# Patient Record
Sex: Female | Born: 1975
Health system: Southern US, Community
[De-identification: ages and names within clinical notes are randomized; demographics above are authoritative.]

## PROBLEM LIST (undated history)

## (undated) DIAGNOSIS — M1990 Unspecified osteoarthritis, unspecified site: Secondary | ICD-10-CM

## (undated) DIAGNOSIS — G935 Compression of brain: Secondary | ICD-10-CM

## (undated) DIAGNOSIS — E119 Type 2 diabetes mellitus without complications: Secondary | ICD-10-CM

## (undated) DIAGNOSIS — R51 Headache: Secondary | ICD-10-CM

## (undated) DIAGNOSIS — M7550 Bursitis of unspecified shoulder: Secondary | ICD-10-CM

## (undated) HISTORY — PX: TUBAL LIGATION: SHX77

## (undated) HISTORY — DX: Bursitis of unspecified shoulder: M75.50

## (undated) HISTORY — DX: Compression of brain: G93.5

## (undated) HISTORY — DX: Unspecified osteoarthritis, unspecified site: M19.90

## (undated) HISTORY — DX: Type 2 diabetes mellitus without complications: E11.9

## (undated) HISTORY — DX: Headache: R51

---

## 2000-09-24 ENCOUNTER — Ambulatory Visit (HOSPITAL_COMMUNITY): Admission: RE | Admit: 2000-09-24 | Discharge: 2000-09-24 | Payer: Self-pay | Admitting: *Deleted

## 2001-02-01 ENCOUNTER — Encounter (INDEPENDENT_AMBULATORY_CARE_PROVIDER_SITE_OTHER): Payer: Self-pay

## 2001-02-01 ENCOUNTER — Inpatient Hospital Stay (HOSPITAL_COMMUNITY): Admission: AD | Admit: 2001-02-01 | Discharge: 2001-02-04 | Payer: Self-pay | Admitting: Obstetrics & Gynecology

## 2001-02-01 ENCOUNTER — Encounter (HOSPITAL_COMMUNITY): Admission: RE | Admit: 2001-02-01 | Discharge: 2001-02-01 | Payer: Self-pay | Admitting: *Deleted

## 2001-02-06 ENCOUNTER — Inpatient Hospital Stay (HOSPITAL_COMMUNITY): Admission: AD | Admit: 2001-02-06 | Discharge: 2001-02-06 | Payer: Self-pay | Admitting: Obstetrics

## 2001-02-18 ENCOUNTER — Inpatient Hospital Stay (HOSPITAL_COMMUNITY): Admission: AD | Admit: 2001-02-18 | Discharge: 2001-02-18 | Payer: Self-pay | Admitting: Obstetrics & Gynecology

## 2005-12-09 ENCOUNTER — Inpatient Hospital Stay (HOSPITAL_COMMUNITY): Admission: AD | Admit: 2005-12-09 | Discharge: 2005-12-09 | Payer: Self-pay | Admitting: *Deleted

## 2005-12-11 ENCOUNTER — Inpatient Hospital Stay (HOSPITAL_COMMUNITY): Admission: AD | Admit: 2005-12-11 | Discharge: 2005-12-11 | Payer: Self-pay | Admitting: *Deleted

## 2005-12-18 ENCOUNTER — Inpatient Hospital Stay (HOSPITAL_COMMUNITY): Admission: RE | Admit: 2005-12-18 | Discharge: 2005-12-18 | Payer: Self-pay | Admitting: *Deleted

## 2007-02-01 ENCOUNTER — Inpatient Hospital Stay (HOSPITAL_COMMUNITY): Admission: AD | Admit: 2007-02-01 | Discharge: 2007-02-01 | Payer: Self-pay | Admitting: Gynecology

## 2007-04-22 ENCOUNTER — Ambulatory Visit (HOSPITAL_COMMUNITY): Admission: RE | Admit: 2007-04-22 | Discharge: 2007-04-22 | Payer: Self-pay | Admitting: Gynecology

## 2007-05-07 ENCOUNTER — Ambulatory Visit (HOSPITAL_COMMUNITY): Admission: RE | Admit: 2007-05-07 | Discharge: 2007-05-07 | Payer: Self-pay | Admitting: Obstetrics and Gynecology

## 2007-05-21 ENCOUNTER — Ambulatory Visit (HOSPITAL_COMMUNITY): Admission: RE | Admit: 2007-05-21 | Discharge: 2007-05-21 | Payer: Self-pay | Admitting: Obstetrics & Gynecology

## 2007-07-11 ENCOUNTER — Ambulatory Visit (HOSPITAL_COMMUNITY): Admission: RE | Admit: 2007-07-11 | Discharge: 2007-07-11 | Payer: Self-pay | Admitting: Family Medicine

## 2007-07-18 ENCOUNTER — Ambulatory Visit (HOSPITAL_COMMUNITY): Admission: RE | Admit: 2007-07-18 | Discharge: 2007-07-18 | Payer: Self-pay | Admitting: Family Medicine

## 2007-07-22 ENCOUNTER — Ambulatory Visit: Payer: Self-pay | Admitting: Obstetrics & Gynecology

## 2007-07-25 ENCOUNTER — Ambulatory Visit (HOSPITAL_COMMUNITY): Admission: RE | Admit: 2007-07-25 | Discharge: 2007-07-25 | Payer: Self-pay | Admitting: Obstetrics & Gynecology

## 2007-07-25 ENCOUNTER — Ambulatory Visit: Payer: Self-pay | Admitting: *Deleted

## 2007-07-29 ENCOUNTER — Ambulatory Visit: Payer: Self-pay | Admitting: Family Medicine

## 2007-08-01 ENCOUNTER — Ambulatory Visit: Payer: Self-pay | Admitting: Obstetrics & Gynecology

## 2007-08-01 ENCOUNTER — Ambulatory Visit (HOSPITAL_COMMUNITY): Admission: RE | Admit: 2007-08-01 | Discharge: 2007-08-01 | Payer: Self-pay | Admitting: Obstetrics & Gynecology

## 2007-08-06 ENCOUNTER — Ambulatory Visit: Payer: Self-pay | Admitting: Obstetrics & Gynecology

## 2007-08-08 ENCOUNTER — Ambulatory Visit (HOSPITAL_COMMUNITY): Admission: RE | Admit: 2007-08-08 | Discharge: 2007-08-08 | Payer: Self-pay | Admitting: Obstetrics & Gynecology

## 2007-08-08 ENCOUNTER — Ambulatory Visit: Payer: Self-pay | Admitting: Obstetrics & Gynecology

## 2007-08-13 ENCOUNTER — Ambulatory Visit: Payer: Self-pay | Admitting: Obstetrics and Gynecology

## 2007-08-15 ENCOUNTER — Ambulatory Visit (HOSPITAL_COMMUNITY): Admission: RE | Admit: 2007-08-15 | Discharge: 2007-08-15 | Payer: Self-pay | Admitting: Obstetrics & Gynecology

## 2007-08-15 ENCOUNTER — Ambulatory Visit: Payer: Self-pay | Admitting: Obstetrics & Gynecology

## 2007-08-18 ENCOUNTER — Ambulatory Visit: Payer: Self-pay | Admitting: Obstetrics & Gynecology

## 2007-08-18 ENCOUNTER — Inpatient Hospital Stay (HOSPITAL_COMMUNITY): Admission: AD | Admit: 2007-08-18 | Discharge: 2007-08-29 | Payer: Self-pay | Admitting: Obstetrics & Gynecology

## 2007-08-18 ENCOUNTER — Ambulatory Visit: Payer: Self-pay | Admitting: *Deleted

## 2007-08-22 ENCOUNTER — Encounter: Payer: Self-pay | Admitting: *Deleted

## 2007-08-26 ENCOUNTER — Encounter: Payer: Self-pay | Admitting: Obstetrics and Gynecology

## 2007-09-03 ENCOUNTER — Inpatient Hospital Stay (HOSPITAL_COMMUNITY): Admission: AD | Admit: 2007-09-03 | Discharge: 2007-09-03 | Payer: Self-pay | Admitting: Obstetrics & Gynecology

## 2007-09-03 ENCOUNTER — Ambulatory Visit: Payer: Self-pay | Admitting: Obstetrics and Gynecology

## 2007-09-06 ENCOUNTER — Inpatient Hospital Stay (HOSPITAL_COMMUNITY): Admission: AD | Admit: 2007-09-06 | Discharge: 2007-09-06 | Payer: Self-pay | Admitting: Obstetrics & Gynecology

## 2007-09-07 ENCOUNTER — Ambulatory Visit: Payer: Self-pay | Admitting: *Deleted

## 2007-09-07 ENCOUNTER — Inpatient Hospital Stay (HOSPITAL_COMMUNITY): Admission: AD | Admit: 2007-09-07 | Discharge: 2007-09-07 | Payer: Self-pay | Admitting: Obstetrics & Gynecology

## 2007-09-08 ENCOUNTER — Inpatient Hospital Stay (HOSPITAL_COMMUNITY): Admission: AD | Admit: 2007-09-08 | Discharge: 2007-09-08 | Payer: Self-pay | Admitting: Obstetrics & Gynecology

## 2007-10-04 ENCOUNTER — Ambulatory Visit: Payer: Self-pay | Admitting: Advanced Practice Midwife

## 2007-10-04 ENCOUNTER — Inpatient Hospital Stay (HOSPITAL_COMMUNITY): Admission: AD | Admit: 2007-10-04 | Discharge: 2007-10-04 | Payer: Self-pay | Admitting: Obstetrics and Gynecology

## 2008-03-25 IMAGING — US US FETAL BPP W/O NONSTRESS
1 series · 8 of 8 positions shown · non-contrast
Comparison: none

OBSTETRICAL ULTRASOUND:
 This ultrasound was performed in The [HOSPITAL], and the [facility] report will be stored to [REDACTED] PACS.

[Series 1: us fetal bpp w/o nonstress · 8 of 8 slices shown]
[im 1/8]
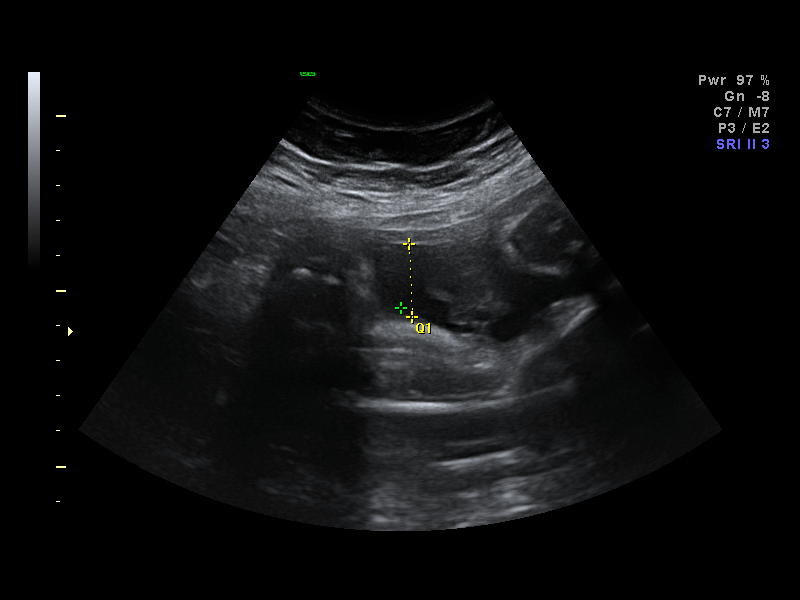
[im 2/8]
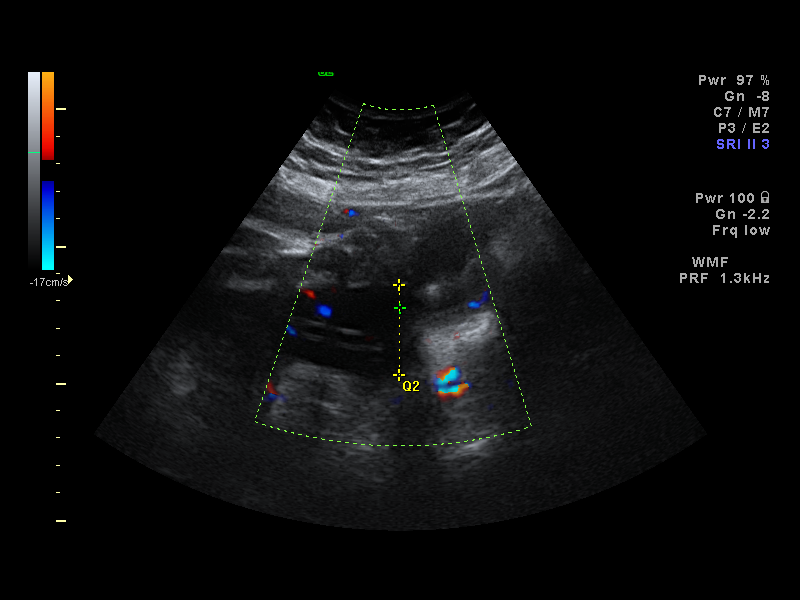
[im 3/8]
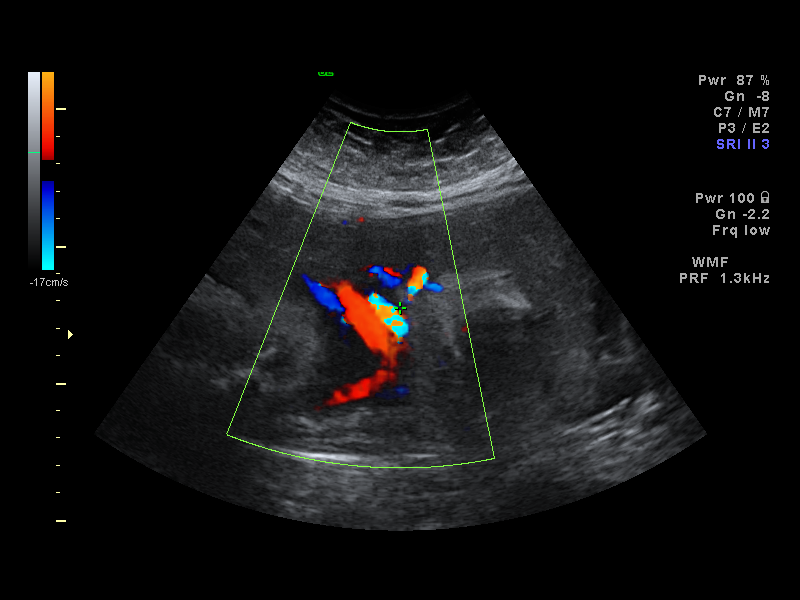
[im 4/8]
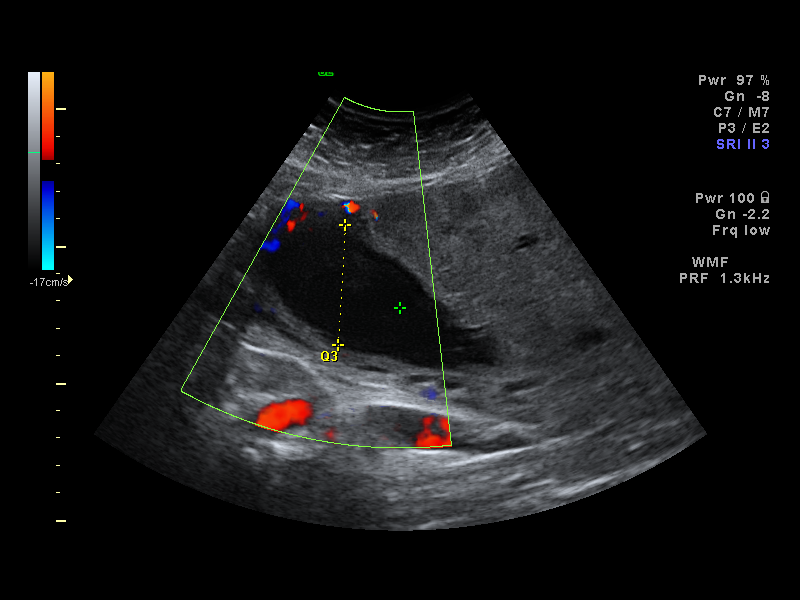
[im 5/8]
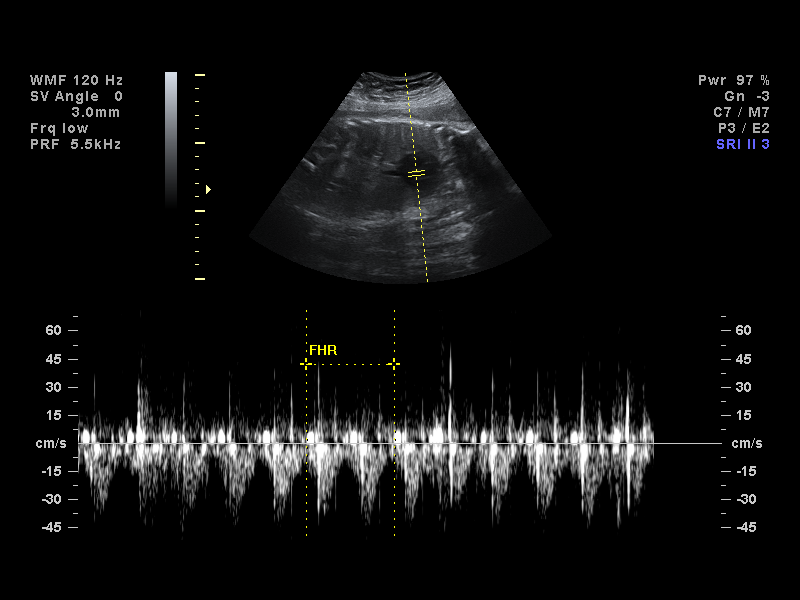
[im 6/8]
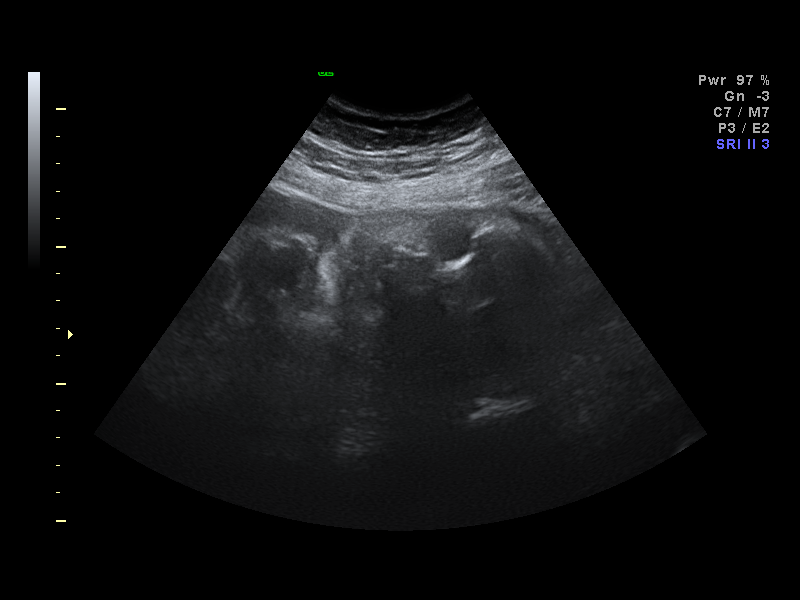
[im 7/8]
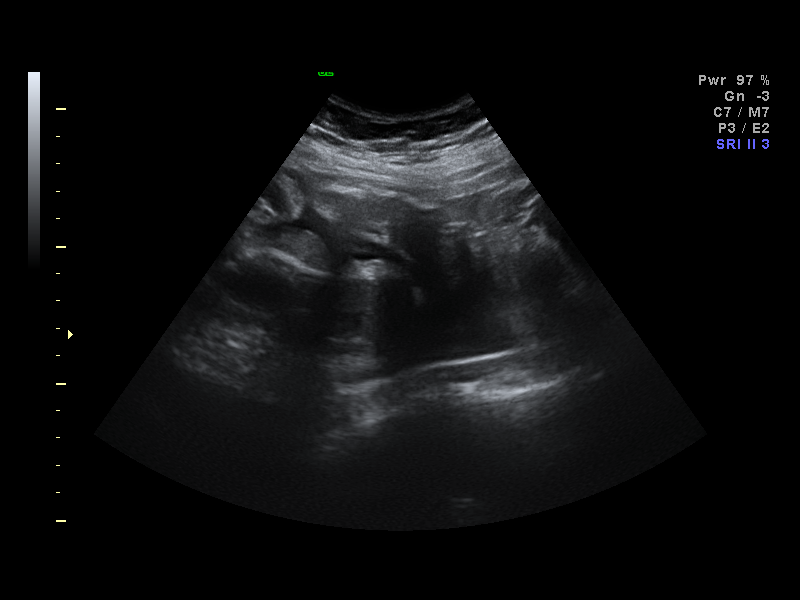
[im 8/8]
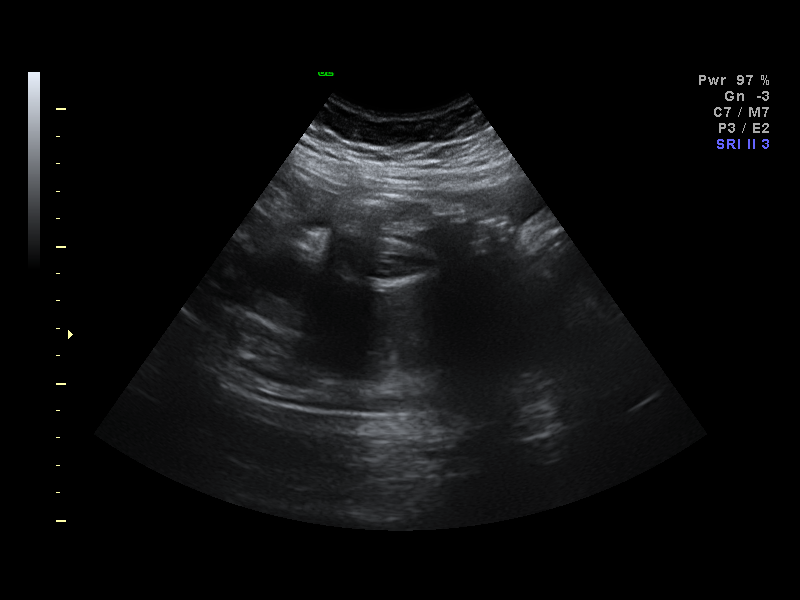

[8 of 8 positions shown; findings below may reference images not displayed]

IMPRESSION: The [facility] report has also been faxed to the ordering physician.

## 2008-07-23 ENCOUNTER — Ambulatory Visit: Payer: Self-pay | Admitting: *Deleted

## 2008-07-23 ENCOUNTER — Ambulatory Visit: Payer: Self-pay | Admitting: Family Medicine

## 2008-08-12 ENCOUNTER — Ambulatory Visit (HOSPITAL_COMMUNITY): Admission: RE | Admit: 2008-08-12 | Discharge: 2008-08-12 | Payer: Self-pay | Admitting: Family Medicine

## 2008-11-24 ENCOUNTER — Ambulatory Visit: Payer: Self-pay | Admitting: Family Medicine

## 2008-12-18 ENCOUNTER — Ambulatory Visit: Payer: Self-pay | Admitting: Family Medicine

## 2009-01-07 ENCOUNTER — Ambulatory Visit: Payer: Self-pay | Admitting: Family Medicine

## 2009-01-07 ENCOUNTER — Encounter (INDEPENDENT_AMBULATORY_CARE_PROVIDER_SITE_OTHER): Payer: Self-pay | Admitting: Internal Medicine

## 2009-01-07 LAB — CONVERTED CEMR LAB
AST: 12 units/L (ref 0–37)
Albumin: 4.5 g/dL (ref 3.5–5.2)
BUN: 13 mg/dL (ref 6–23)
Calcium: 8.7 mg/dL (ref 8.4–10.5)
Chloride: 106 meq/L (ref 96–112)
HDL: 57 mg/dL (ref >39)
Potassium: 4 meq/L (ref 3.5–5.3)
TSH: 0.62 microintl units/mL (ref 0.350–4.500)

## 2009-03-09 ENCOUNTER — Ambulatory Visit: Payer: Self-pay | Admitting: Internal Medicine

## 2009-04-15 ENCOUNTER — Ambulatory Visit: Payer: Self-pay | Admitting: Internal Medicine

## 2009-07-15 ENCOUNTER — Ambulatory Visit: Payer: Self-pay | Admitting: Family Medicine

## 2009-10-12 ENCOUNTER — Ambulatory Visit: Payer: Self-pay | Admitting: Internal Medicine

## 2009-11-09 ENCOUNTER — Ambulatory Visit: Payer: Self-pay | Admitting: Internal Medicine

## 2009-11-09 ENCOUNTER — Encounter (INDEPENDENT_AMBULATORY_CARE_PROVIDER_SITE_OTHER): Payer: Self-pay | Admitting: Family Medicine

## 2009-11-09 LAB — CONVERTED CEMR LAB
Chlamydia, DNA Probe: NEGATIVE
GC Probe Amp, Genital: NEGATIVE

## 2009-11-25 ENCOUNTER — Ambulatory Visit: Payer: Self-pay | Admitting: Family Medicine

## 2009-12-06 ENCOUNTER — Ambulatory Visit: Payer: Self-pay | Admitting: Internal Medicine

## 2010-10-09 DIAGNOSIS — M1990 Unspecified osteoarthritis, unspecified site: Secondary | ICD-10-CM

## 2010-10-09 HISTORY — DX: Unspecified osteoarthritis, unspecified site: M19.90

## 2010-10-30 ENCOUNTER — Encounter: Payer: Self-pay | Admitting: *Deleted

## 2011-02-21 NOTE — Op Note (Signed)
NAME:  Marissa Jimenez, Marissa Jimenez         ACCOUNT NO.:  [id number]   MEDICAL RECORD NO.:  [id number]          PATIENT TYPE:  INP   LOCATION:                                FACILITY:  [facility]   PHYSICIAN:  Phil D. Okey Dupre, M.D.     DATE OF BIRTH:  Feb 20, 1976   DATE OF PROCEDURE:  08/26/2007  DATE OF DISCHARGE:                               OPERATIVE REPORT   PROCEDURE:  Classical cesarean section and bilateral tubal occlusion.   PREOPERATIVE DIAGNOSIS:  Repeat cesarean section, complete placenta  previa with anterior placenta and voluntary sterilization.   SURGEON:  Allie Bossier, MD; Javier Glazier. Okey Dupre, M.D.   ASSISTANT:  Dr. Melvenia Beam.   ESTIMATED BLOOD LOSS:  1000 mL.   POSTOPERATIVE CONDITION:  Satisfactory.   ANESTHESIA:  Spinal.   SPECIMENS TO PATHOLOGY:  Placenta.   OPERATIVE FINDINGS:  On entering the peritoneal cavity, there were large  burgundy lakes beneath the thin perineum at the lower uterine segment  just above the bladder.  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  The procedure went as follows.   Under satisfactory spinal anesthesia the patient in dorsal supine  position after Foley catheter was placed in urinary bladder, the abdomen  was prepped and draped in the usual sterile manner and entered through a  vertical midline incision extending from just below the umbilicus to  just above the symphysis pubis.  The abdomen was entered by layers.  On  entering the peritoneal cavity there were marked adhesions of omentum to  anterior abdominal wall which were controlled by sharp and blunt  dissection.  Areas of small bleeders were controlled with hot cautery.  Several large omental pieces were clamped, divided and the portion  remaining tied with 1-0 chromic catgut suture.  This was done in three  to four areas and a large piece of omentum was removed freeing up the  visualization to the lower uterine segment.  An Alexis retractor was  placed into the pelvis  and the visualization of the lower segment was as  described above.  It was decided at this time to do a vertical incision  so a classical incision was made approximately 3 cm from the fundus of  the uterus down to just above the lower uterine segment where the  vascular lakes appeared.  In doing this we went right through the  placenta in the lower portion of this incision.  The membranes were  ruptured with a hemostat and the baby quite easily delivered.  the  placenta then surprisingly was easily removed and the area was observed.  This seemed to be no sign of accreta and the incision was closed with  continuous running locked 0 Vicryl on an atraumatic needle.  Two layers  were run in this fashion.  The uterus was still quite boggy. Massage was  carried out vigorously.  Methergine was given IM as well as a direct  injection of Hemabate into the uterine wall.  Firming occurred almost  immediately.  Filshie clamps were placed on each of the tubes  approximately 2 cm from  the distal end for sterilization.  Areas  observed for bleeding.  None was noted and the #1 PDS suture was used to  close the incision in a Smead-Jones type of closure starting at the top  running to half way and then starting at the bottom of the incision and  running up to meet the superior closure.  Areas of the  subcutaneous bleeders were controlled with hot cautery and 2-0 plain  catgut suture was used for subcutaneous approximation.  Skin staples for  skin edge approximation.  Dry sterile dressing was applied.  Total blood  loss approximately 1000 mL.  Tape, instrument, sponge and needle count  reported correct at the end of the procedure.      Phil D. Okey Dupre, M.D.  Electronically Signed     PDR/MEDQ  D:  08/26/2007  T:  08/26/2007  Job:  [id number]

## 2011-02-21 NOTE — Discharge Summary (Signed)
NAME:  Marissa Jimenez, Marissa Jimenez         ACCOUNT NO.:  [id number]   MEDICAL RECORD NO.:  [id number]          PATIENT TYPE:  INP   LOCATION:  9113                          FACILITY:  [facility]   PHYSICIAN:  Phil D. Okey Dupre, M.D.     DATE OF BIRTH:  08-Feb-1976   DATE OF ADMISSION:  08/18/2007  DATE OF DISCHARGE:  08/29/2007                               DISCHARGE SUMMARY   DIAGNOSES ON ADMISSION:  1. Bleeding in pregnancy with complete placenta previa.  2. Bicornuate uterus.  3. History of previous C-section.  4. History of dilation and curettage for retained products.   DIAGNOSES ON DISCHARGE:  1. Bleeding in pregnancy with complete placenta previa.  2. Bicornuate uterus.  3. History of previous C-section.  4. History of dilation and curettage for retained products.  5. Classical cesarean section.  6. Bilateral tubal ligation.   BRIEF HOSPITAL COURSE:  [name] is a 35 year old female  who presented to the [facility] [place] on August 18, 2007  reporting vaginal bleeding.  She was noted to be [redacted] weeks gestation, G4,  P2-0-1-2 with history of previous C-section and dilation and curettage  for retained products.  She was followed in high risk clinic for history  of a complete previa and bicornuate uterus.  Given her history and her  presentation, she was admitted to the antenatal service for close  observation.  Her bleeding stabilized and she remained in stable  condition but it was thought, given her history and presentation, the  safest course of action would be delivery at appropriate gestational  age.  Amniocentesis was considered; however, there was no appropriate  pocket amniotic fluid that would be safe for this procedure.  Consent  was obtained for the procedure, as well as a bilateral tubal ligation  and on November 17, at [redacted] weeks gestation, the patient was taken to the  OR where a classical cesarean section with vertical skin incision, as  well as bilateral  tubal ligation was performed by Dr. Okey Dupre, assisted by  Dr. Marice Potter and Dr. Melvenia Beam.  There is a viable female infant weighing 5 lb,  12 oz with Apgars of 7 and 8 at 1 and 5 minutes respectively.  Also  noted were bicornuate uterus, normal tubes and ovaries.  PTL performed  without complications.  The patient was transferred in good condition to  the postpartum floor, where she remained stable with good vital signs  and on the 3rd postoperative day was discharged home.  An order was  given for [facility] nurse to remove the patient's staples on an  outpatient basis on Monday or Tuesday, November 24-25 given her vertical  skin incision.  At the time of discharge, the patient had no questions or complaints of  note.  She is breast feeding well.  Had a bowel movement and urinated  before discharge.  Vital signs were stable.  She is Rh positive and  rubella immune.  She is to follow up in 6 weeks at the [facility].     ______________________________  Franki Cabot D. Okey Dupre, M.D.  Electronically Signed    JP/MEDQ  D:  08/29/2007  T:  08/29/2007  Job:  [id number]

## 2011-02-24 NOTE — Discharge Summary (Signed)
[facility]  Patient:    Marissa Jimenez, Marissa Jimenez                  MRN: [id number] Adm. Date:  [id number] Disc. Date: [id number] Attending:  Antionette Char Dictator:   Jamey Reas, M.D.                           Discharge Summary  DATE OF BIRTH:                [birth year]  ADMITTING DIAGNOSES:          Term intrauterine pregnancy at 89 and 4, breech presentation, BPP 6/10.  DISCHARGE DIAGNOSES:          Term intrauterine pregnancy at 58 and 4, breech presentation, BPP 6/10, status post low transverse cesarean section for breech presentation and oligohydramnios, routine postoperative care.  CONSULTS:                     None.  PROCEDURE:                    The patient had an low transverse cesarean section performed on February 01, 2001 by Dr. Coral Ceo and Dr. Antionette Char.  At the time of procedure patient was noted to have a bicornuate uterus with one large lobe containing the fetus, smaller more muscular lobe.  Patient had a thin fundus in the horn that contained the fetus.  Patient had routine postoperative course.  No fever or chills. Recovered well with adequate pain control.  She was tolerating a regular diet and ambulating without complication.  CONDITION ON DISCHARGE:       Good.  DISPOSITION:                  Discharged to home.  Spoke with patient at length regarding her bicornuate uterus and abnormalities involving the urogenital tract.  She could have some ______ dysgenesis.  It was recommended to her that patient have an IVP to rule out any urinary tract malformation. This should be scheduled by her followup care takers at her six week postpartum check.  She was advised to remind them of this.  Patient was also discussed at length complications that could result from a further vaginal birth.  It was recommended to her that she have a repeat cesarean section; however, it was also recommended to her that she could have  a vaginal birth without augmentation of labor including prostaglandins specifically.  This was discussed with patient through an interpreter.  Patient agreed that she understood this information and had no further questions regarding this.  DISCHARGE MEDICATIONS:        1. Percocet 5/325 mg one to two p.o. q.4-6h.                                  p.r.n. pain.                               2. Motrin 600 mg one p.o. q.6-8h. p.r.n. pain.                               3. Prenatal vitamins one p.o. q.d.  FOLLOW-UP:  [facility].  DISCHARGE INSTRUCTIONS:       Routine postoperative instructions for work as well as activity. DD:  02/05/01 TD:  02/05/01 Job: [id number]

## 2011-02-24 NOTE — Op Note (Signed)
[facility]  Patient:    Marissa Jimenez, Marissa Jimenez                  MRN: [id number] Proc. Date: 02/01/01 Adm. Date:  [id number] Disc. Date: [id number] Attending:  Antionette Char                           Operative Report  PREOPERATIVE DIAGNOSIS:       1. Intrauterine pregnancy at term.                               2. Oligohydramnios.                               3. Breach presentation.  POSTOPERATIVE DIAGNOSIS:      1. Intrauterine pregnancy at term.                               2. Oligohydramnios.                               3. Breach presentation.                               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  OPERATION:                    Primary low transverse cesarean section via Pfannenstiel.  SURGEONS:                     Roseanna Rainbow, M.D.                               Charles A. Clearance Coots, M.D.  ANESTHESIA:                   Epidural.  COMPLICATIONS:                Mild uterine ______  that responded to intramyometrial uterine Pitocin.  ESTIMATED BLOOD LOSS:         800 cc.  FLUIDS:                       1500 cc lactated ringers.  Urine output 300 cc clear urine.  INDICATIONS:                  The patient is a 35 year old para 1 at 40+ weeks with oligohydramnios and breach presentation.  FINDINGS:                     Infant, frank breach.  Neonatology present at delivery.  The uterus, again, appeared bicornuate with the infant having been in the left horn.  The right horn was felt not to be communicating with the uterine cavity.  The tubes and ovaries were otherwise within normal limits.  DESCRIPTION OF PROCEDURE:     The patient  was taken to the operating room where spinal anesthetic was administered and found to be adequate.  She was then prepped and draped in the usual sterile fashion in the dorsosupine position with a leftward tilt.   A  Pfannenstiel skin incision was then made with the scalpel and carried through to the underlying layer of fascia.  The fascia was nicked in the midline and the incision extended laterally with Mayo scissors.  The superior aspect of the fascial incision was then grasped with Kocher clamps, elevated, and the underlying rectus muscles dissected off. Attention was then turned to the inferior aspect of this incision which was manipulated in a similar fashion.  The rectus muscles were then separated in the midline and the peritoneum tented up and entered sharply with the Metzenbaum scissors.   The peritoneal incision was then extended superiorly and inferiorly with good visualization of the bladder.   The bladder blade was then inserted and the vesicle uterine peritoneum identified, grasped with pickups, and entered sharply.  This incision was then extended laterally and the bladder flap created digitally.  The bladder blade was then reinserted and the lower uterine segment incised in transverse fashion with the scalpel.  The uterine incision was then extended laterally with the bandage scissors.  The bladder blade was removed, and a complete breach extraction was then performed with the infants head ultimately being delivered atraumatically.  The nose and mouth were suctioned with the bulb suction and the cord clamped and cut. The infant was handed off to the awaiting neonatologist.  The placenta was removed.  The uterus was exteriorized and cleared of all clots and debris.  At this point, the uterine tone in the fundus was felt to be boggy, and 10 units of Pitocin were injection into the myometrium into the fundus.  This, in combination with IV Pitocin and massage, produced an improvement in the uterine tone.  The uterine incision was then repaired with O Monocryl in a running interlocking fashion.  Excellent hemostasis was noted.  The uterus was returned to the abdomen.  The gutters were cleared  of all clots.  The fascia was reapproximated with 0 Vicryl.  The skin was closed with staples.  The patient tolerated the procedure well.  Sponge, lap, and needle counts were correct x 2.  The patient was taken to the PACU in stable condition. DD:  02/04/01 TD:  02/04/01 Job: [id number]

## 2011-07-18 LAB — CBC
Hemoglobin: 10 — ABNORMAL LOW
Hemoglobin: 12.1
MCHC: 34.8
MCV: 94.4
Platelets: 196
RBC: 3.05 — ABNORMAL LOW
RBC: 4.02
RDW: 13.9
WBC: 10.1

## 2011-07-18 LAB — CROSSMATCH
ABO/RH(D): O POS
Antibody Screen: NEGATIVE

## 2011-07-18 LAB — TYPE AND SCREEN: Antibody Screen: NEGATIVE

## 2011-07-18 LAB — POCT URINALYSIS DIP (DEVICE)
Glucose, UA: NEGATIVE
Ketones, ur: NEGATIVE
Operator id: 120861
Specific Gravity, Urine: 1.015
Urobilinogen, UA: 0.2

## 2011-07-18 LAB — RPR: RPR Ser Ql: NONREACTIVE

## 2011-07-19 LAB — POCT URINALYSIS DIP (DEVICE)
Hgb urine dipstick: NEGATIVE
Ketones, ur: NEGATIVE
Ketones, ur: NEGATIVE
Protein, ur: NEGATIVE
Protein, ur: NEGATIVE
Protein, ur: NEGATIVE
Specific Gravity, Urine: 1.02
Specific Gravity, Urine: 1.02
Specific Gravity, Urine: 1.025
pH: 7
pH: 7
pH: 7

## 2011-09-05 ENCOUNTER — Other Ambulatory Visit: Payer: Self-pay | Admitting: Family Medicine

## 2011-10-10 DIAGNOSIS — R519 Headache, unspecified: Secondary | ICD-10-CM

## 2011-10-10 HISTORY — DX: Headache, unspecified: R51.9

## 2012-09-19 ENCOUNTER — Ambulatory Visit (INDEPENDENT_AMBULATORY_CARE_PROVIDER_SITE_OTHER): Payer: Self-pay | Admitting: Family Medicine

## 2012-09-19 ENCOUNTER — Encounter: Payer: Self-pay | Admitting: Family Medicine

## 2012-09-19 VITALS — BP 119/64 | HR 66 | Temp 98.8°F | Ht 63.0 in | Wt 216.6 lb

## 2012-09-19 DIAGNOSIS — R10A0 Flank pain, unspecified side: Secondary | ICD-10-CM

## 2012-09-19 DIAGNOSIS — R109 Unspecified abdominal pain: Secondary | ICD-10-CM

## 2012-09-19 NOTE — Progress Notes (Signed)
  Subjective:    Patient ID: Marissa Jimenez, female    DOB: 1976/03/20, 36 y.o.   MRN: [id number]  HPI  Patient here to establish care.  Spanish interpreter present.  She is awaiting an orange card. Patient denies any chronic medical problems and does not take any medications. Acutely, she complains of pain located below RT breast and radiates RT flank. Started 3-4 days ago.  Complains of some discomfort with urination, but no pain/burning with urination.  Denies any associated fever, chills, nausea/vomiting or decreased appetite. Denies any gross blood in urine.  No hx of kidney stones. Took 2 Aspirin for pain 2 days ago - pain improved a little.  I have reviewed patient's PMH, SH, Surgeries, Medications, Allergies, Problem List, and FH.  Review of Systems  Per HPI    Objective:   Physical Exam  Constitutional: She appears well-nourished. No distress.       Obese  Cardiovascular: Normal rate and regular rhythm.   Abdominal: Soft. Bowel sounds are normal. She exhibits no distension. There is no tenderness. There is no rebound and no guarding.      Assessment & Plan:

## 2012-09-20 DIAGNOSIS — R109 Unspecified abdominal pain: Secondary | ICD-10-CM | POA: Insufficient documentation

## 2012-09-20 NOTE — Assessment & Plan Note (Signed)
Flank pain may be secondary to MSK strain/sprain vs. UTI vs. Kidney stone.  However, she denies any GU symptoms and no gross hematuria.  Because patient does not have insurance, I discussed conservative treatment with over the counter Motrin or Tylenol as needed for pain.  Encouraged patient to drink plenty of water and avoid soda/juices for now.  If she develops dysuria, she may try over the counter AZO.  If symptoms worsen or if she develops fever, vomiting, or worsening flank pain, patient to return clinic for work up, UA, cultures, etc.  Patient will call Britta Mccreedy today to set up orange card.  Follow up with me as soon as she receives card.  Red flags reviewed with patient via interpreter.

## 2012-09-20 NOTE — Patient Instructions (Signed)
Instructions discussed with patient via Spanish interpreter.

## 2012-11-27 ENCOUNTER — Ambulatory Visit (INDEPENDENT_AMBULATORY_CARE_PROVIDER_SITE_OTHER): Payer: No Typology Code available for payment source | Admitting: Family Medicine

## 2012-11-27 ENCOUNTER — Encounter: Payer: Self-pay | Admitting: Family Medicine

## 2012-11-27 VITALS — BP 127/90 | HR 71 | Temp 98.9°F | Ht 63.0 in | Wt 213.0 lb

## 2012-11-27 DIAGNOSIS — R109 Unspecified abdominal pain: Secondary | ICD-10-CM

## 2012-11-27 LAB — POCT URINALYSIS DIPSTICK
Bilirubin, UA: NEGATIVE
Ketones, UA: NEGATIVE
Leukocytes, UA: NEGATIVE
pH, UA: 7

## 2012-11-27 LAB — POCT UA - MICROSCOPIC ONLY

## 2012-11-27 MED ORDER — IBUPROFEN 600 MG PO TABS: 600.0000 mg | ORAL_TABLET | Freq: Three times a day (TID) | ORAL | Status: DC | PRN

## 2012-11-27 MED ORDER — CYCLOBENZAPRINE HCL 5 MG PO TABS: 5.0000 mg | ORAL_TABLET | Freq: Three times a day (TID) | ORAL | Status: DC | PRN

## 2012-11-27 NOTE — Assessment & Plan Note (Signed)
UA was positive for blood, likely due to period.  No evidence of infection on UA.  Patient is afebrile with no other systemic symptoms.  Unable to exclude nephrolithiasis due to menstrual bleeding. - Will treat pain with Flexeril and Ibuprofen 600 PRN to treat possible muscle strain/sprain.   - Follow up in 2 weeks, if no improvement in symptoms, will consider [place] or CT abdomen. - Red flags reviewed.

## 2012-11-27 NOTE — Patient Instructions (Addendum)
Return to clinic in 2 weeks if pain does not improve despite pain medications. If you develop worsening pain, fever, nausea/vomiting, please return to clinic.

## 2012-11-27 NOTE — Progress Notes (Signed)
  Subjective:    Patient ID: Marissa Jimenez, female    DOB: 04-Aug-1976, 37 y.o.   MRN: [id number]  HPI  Patient returns to clinic to discuss bilateral flank pain.  R side worse than L.  Pain has been present for 8 months.  Pain says pain has been getting progressively worse in the last 2 months.  Pain is constant.  Described as aching and "numb".  Pain does not wake her up at night, but she has trouble getting comfortable in certain positions.  Pain is worse after prolonged standing, but better with rest.  Pain is worse with menstrual cycles.   She also complains of HA and dizziness, lightheadedness with menstrual cycles.  Endorses thin, white vaginal discharge.  Denies any dysuria or burning with urination.  Does endorse urinary urgency.  Regular bowel movements.  Does notice a small amount of blood in stools at times.  Denies any hx of kidney stone.  Denies any fever, chills, nausea or vomiting.  Good appetite.   She has tried 2 ASA for pain with some relief.    LMP: currently on menstrual cycle.  She is currently sexually active with one partner in the last 2 months.  Review of Systems Per HPI    Objective:   Physical Exam  Constitutional: She appears well-nourished. No distress.  Abdominal: Soft. Bowel sounds are normal. She exhibits no distension. There is no tenderness. There is no rebound and no guarding.  Musculoskeletal:  No CVA tenderness, however RT paraspinal muscles tender to palpation; normal ROM; no skin changes, no rashes       Assessment & Plan:

## 2012-12-11 ENCOUNTER — Ambulatory Visit: Payer: No Typology Code available for payment source | Admitting: Family Medicine

## 2012-12-11 ENCOUNTER — Ambulatory Visit (INDEPENDENT_AMBULATORY_CARE_PROVIDER_SITE_OTHER): Payer: No Typology Code available for payment source | Admitting: Family Medicine

## 2012-12-11 ENCOUNTER — Encounter: Payer: Self-pay | Admitting: Family Medicine

## 2012-12-11 VITALS — BP 122/67 | HR 68 | Temp 98.3°F | Ht 63.0 in | Wt 214.5 lb

## 2012-12-11 DIAGNOSIS — R1013 Epigastric pain: Secondary | ICD-10-CM

## 2012-12-11 MED ORDER — OMEPRAZOLE 40 MG PO CPDR: 40.0000 mg | DELAYED_RELEASE_CAPSULE | Freq: Every day | ORAL | Status: DC

## 2012-12-11 NOTE — Patient Instructions (Addendum)
Schedule annual physical appointment in April.  Dieta para el reflujo gastroesofgico - Adultos  (Diet for Gastroesophageal Reflux Disease, Adult)  El reflujo (reflujo cido) ocurre cuando el cido del estmago pasa al esfago. Cuando el cido entra en contacto con el esfago, el cido provoca dolor e irritacin (inflamacin) en el esfago. Cuando el reflujo ocurre a menudo o es tan grave que causa dao en el esfago, se denomina enfermedad por reflujo gastroesofgico (ERGE). La terapia nutricional puede ayudar a Acupuncturist de la [place].  ALIMENTOS O BEBIDAS QUE DEBE EVITAR O LIMITAR   Fumar o consumir tabaco. La nicotina es uno de los estimulantes ms potentes en la produccin de cido en el tracto gastrointestinal.  Caf y t negro con cafena o descafeinado.  Gaseosas comunes o bajas caloras o bebidas energizantes (las gaseosas sin cafena estn permitidas).   Especias picantes, como la pimienta negra, pimienta blanca, pimienta roja, pimienta de cayena, curry en polvo,y [place] en polvo.  Menta y mentol.  Chocolate.  Alimentos con alto contenido de grasas, incluyendo las carnes y comidas fritas. El agregado de [place] extra, por ejemplo aceite, [place], aderezo para ensaladas y nueces. Limite estos alimentos a menos de 8 cucharaditas por da.  Las frutas y verduras si no son toleradas, tales como frutas ctricas o tomates.  El alcohol.  Todo alimento que agrave el trastorno. Si tiene dudas relacionadas con la dieta, comunquese con el profesional que lo asiste o con un nutricionista matriculado.  OTROS FACTORES QUE PUEDEN ALIVIAR EL ERGE SON:   Comer lentamente, en un clima distendido.  Hacer 5 o 6 comidas pequeas por da en vez de tres grandes.  Suprimir por un [employer] alimentos que causen problemas.  No acostarse hasta despus de 3 horas de haber comido.  Mantener la cabeza elevada 6 a 9 pulgadas (15 a 23 cm) usando una cua de espuma o bloques debajo de las patas  de la cama. Si permanece en una postura plana har empeorar los sntomas.  Mantngase fsicamente activo. Perder peso puede ser de ayuda para reducir el [employer] adultos obesos o con sobrepeso.  Use ropas sueltas. EJEMPLO DE UN PLAN DE ALIMENTACIN  Este plan de alimentacin consiste en aproximadamente 2 000 caloras, segn las guas de alimentacin de https://www.bernard.org/.  Desayuno   taza de avena cocida.  1 porcin de fresas.  1 taza de [employer].  1 oz de almendras. Colacin  1 taza de rebanadas de pepino.  6 oz de yogur (elaborado con [employer] con bajo contenido de grasas o descremada). Almuerzo:  2 rebanada de pan integral.  2 oz de rebanadas de pavo.  2 cucharaditas de mayonesa.  1 taza de arndanos.  1 taza de guisantes. Colacin  6 crackers integrales.  1 oz ( 28 g) de queso en hebras. Cena   taza de arroz integral.  1 taza de vegetales variados.  1 cucharadita de aceite de oliva.  3 oz ( 84 g) de pescado grill. Document Released: 07/05/2005 Document Revised: 12/18/2011 [facility] Patient Information 2013 [place], [place].

## 2012-12-11 NOTE — Progress Notes (Signed)
  Subjective:    Patient ID: Marissa Jimenez, female    DOB: [birth year], 37 y.o.   MRN: [id number]  HPI  Patient here for follow up flank pain.   Last seen 2/19 - given Rx for Flexeril and Motrin for possible muscle strain/sprain. Flank pain has resolved.   Denies any dysuria or hematuria.  Now she presents with mid-epigastric pain. Abdominal pain is not associated with eating, but worsens after eating spicy and sweet foods. 2 days ago, patient says she was experiencing reflux all day. Pain comes and goes; denies any abdominal pain at this time. Denies any fevers, chills, NS, nausea/vomiting, recent wt loss.  Denies any hoarseness or cough.  Review of Systems Per HPI    Objective:   Physical Exam  Constitutional: She appears well-nourished. No distress.  Cardiovascular: Normal rate.   Pulmonary/Chest: Effort normal.  Abdominal: Soft. Bowel sounds are normal. She exhibits no distension. There is tenderness. There is no rebound and no guarding.  Mild TTP mid-epigastric area.      Assessment & Plan:

## 2012-12-11 NOTE — Assessment & Plan Note (Signed)
Symptoms consistent with GERD worsened after eating spicy foods.  Will start a trial of Omeprazole 40 mg daily x 4 weeks.  Follow up in 4 weeks.  If no improvement in symptoms, will consider H. Pylori test or abdominal US.

## 2012-12-13 ENCOUNTER — Emergency Department (HOSPITAL_COMMUNITY)
Admission: EM | Admit: 2012-12-13 | Discharge: 2012-12-13 | Disposition: A | Discharge status: Home / Self Care | Payer: No Typology Code available for payment source | Attending: Emergency Medicine | Admitting: Emergency Medicine

## 2012-12-13 ENCOUNTER — Emergency Department (HOSPITAL_COMMUNITY): Payer: No Typology Code available for payment source

## 2012-12-13 ENCOUNTER — Encounter (HOSPITAL_COMMUNITY): Payer: Self-pay | Admitting: *Deleted

## 2012-12-13 DIAGNOSIS — R109 Unspecified abdominal pain: Secondary | ICD-10-CM | POA: Insufficient documentation

## 2012-12-13 DIAGNOSIS — Z8739 Personal history of other diseases of the musculoskeletal system and connective tissue: Secondary | ICD-10-CM | POA: Insufficient documentation

## 2012-12-13 DIAGNOSIS — N12 Tubulo-interstitial nephritis, not specified as acute or chronic: Secondary | ICD-10-CM

## 2012-12-13 DIAGNOSIS — Z9851 Tubal ligation status: Secondary | ICD-10-CM | POA: Insufficient documentation

## 2012-12-13 LAB — CBC WITH DIFFERENTIAL/PLATELET
Basophils Absolute: 0.1 10*3/uL (ref 0.0–0.1)
Basophils Relative: 1 % (ref 0–1)
Hemoglobin: 14.2 g/dL (ref 12.0–15.0)
MCHC: 34.4 g/dL (ref 30.0–36.0)
Monocytes Relative: 5 % (ref 3–12)
Neutro Abs: 10.9 10*3/uL — ABNORMAL HIGH (ref 1.7–7.7)
Neutrophils Relative %: 76 % (ref 43–77)
Platelets: 299 10*3/uL (ref 150–400)
RDW: 13.2 % (ref 11.5–15.5)

## 2012-12-13 LAB — URINALYSIS, ROUTINE W REFLEX MICROSCOPIC
Ketones, ur: NEGATIVE mg/dL
Nitrite: NEGATIVE
pH: 6.5 (ref 5.0–8.0)

## 2012-12-13 LAB — POCT I-STAT, CHEM 8
Chloride: 105 mEq/L (ref 96–112)
HCT: 44 % (ref 36.0–46.0)
Potassium: 4.1 mEq/L (ref 3.5–5.1)
Sodium: 141 mEq/L (ref 135–145)

## 2012-12-13 LAB — COMPREHENSIVE METABOLIC PANEL
ALT: 24 U/L (ref 0–35)
AST: 15 U/L (ref 0–37)
Albumin: 4.2 g/dL (ref 3.5–5.2)
CO2: 27 mEq/L (ref 19–32)
Chloride: 100 mEq/L (ref 96–112)
GFR calc non Af Amer: >90 mL/min (ref >90)
Potassium: 4.1 mEq/L (ref 3.5–5.1)
Sodium: 137 mEq/L (ref 135–145)
Total Bilirubin: 0.2 mg/dL — ABNORMAL LOW (ref 0.3–1.2)

## 2012-12-13 LAB — URINE MICROSCOPIC-ADD ON

## 2012-12-13 MED ORDER — DEXTROSE 5 % IV SOLN: 1.0000 g | Freq: Once | INTRAVENOUS | Status: DC

## 2012-12-13 MED ORDER — SULFAMETHOXAZOLE-TRIMETHOPRIM 800-160 MG PO TABS: 1.0000 | ORAL_TABLET | Freq: Two times a day (BID) | ORAL | Status: AC

## 2012-12-13 MED ORDER — SULFAMETHOXAZOLE-TMP DS 800-160 MG PO TABS
1.0000 | ORAL_TABLET | Freq: Once | ORAL | Status: AC
  Administered 2012-12-13: 1 via ORAL
  Filled 2012-12-13: qty 1

## 2012-12-13 NOTE — ED Notes (Signed)
Pt c/o burning on urination, bil flank pain and headache.  Denies nvd, but c/o constipation this am as well as smelly discharge.

## 2012-12-13 NOTE — ED Provider Notes (Signed)
History     CSN: [id number]  Arrival date & time 12/13/12  1625   First MD Initiated Contact with Patient 12/13/12 1654      CC: pain with urination  HPI 37 year old female who presents to the emergency department for concern of painful urination. Reports that this started yesterday. During this time also noted pain in the right upper quadrant and bilateral flank areas. Reports she's had this pain intermittently over the last year but never had this with the dysuria. Reports that she thinks that these are related. Has had subjective chills but no fevers. No other symptoms. Pain described as moderate in severity, insidious onset, sharp, no radiation, worse with palpation, better with rest.  Past Medical History  Diagnosis Date  . Arthritis 2012    Past Surgical History  Procedure Laterality Date  . Cesarean section      two c-sections 2002, 2008  . Tubal ligation      Family History  Problem Relation Age of Onset  . Hyperlipidemia Mother   . Arthritis Mother   . Diabetes Father   . Diabetes Sister     History  Substance Use Topics  . Smoking status: Never Smoker   . Smokeless tobacco: Never Used  . Alcohol Use: No    OB History   Grav Para Term Preterm Abortions TAB SAB Ect Mult Living                  Review of Systems  Constitutional: Negative for fever and chills.  HENT: Negative for congestion, rhinorrhea, neck pain and neck stiffness.   Respiratory: Negative for cough and shortness of breath.   Cardiovascular: Negative for chest pain.  Gastrointestinal: Positive for abdominal pain. Negative for nausea, vomiting, diarrhea and abdominal distention.  Endocrine: Negative for polyuria.  Genitourinary: Negative for dysuria.  Skin: Negative for rash.  Neurological: Negative for headaches.  Psychiatric/Behavioral: Negative.   All other systems reviewed and are negative.    Allergies  Review of patient's allergies indicates no known allergies.  Home  Medications   Current Outpatient Rx  Name  Route  Sig  Dispense  Refill  . omeprazole (PRILOSEC) 20 MG capsule   Oral   Take 20 mg by mouth 2 (two) times daily.           BP 135/85  Pulse 87  Temp(Src) 98.5 F (36.9 C) (Oral)  Resp 18  SpO2 99%  LMP 12/01/2012  Physical Exam  Nursing note and vitals reviewed. Constitutional: She is oriented to person, place, and time. She appears well-developed and well-nourished. No distress.  HENT:  Head: Normocephalic and atraumatic.  Right Ear: External ear normal.  Left Ear: External ear normal.  Nose: Nose normal.  Mouth/Throat: Oropharynx is clear and moist. No oropharyngeal exudate.  Eyes: EOM are normal. Pupils are equal, round, and reactive to light.  Neck: Normal range of motion. Neck supple. No tracheal deviation present.  Cardiovascular: Normal rate.   Pulmonary/Chest: Effort normal and breath sounds normal. No stridor. No respiratory distress. She has no wheezes. She has no rales.  Abdominal: Soft. She exhibits no distension. There is tenderness in the right upper quadrant. There is CVA tenderness (bilateral). There is no rebound.  Musculoskeletal: Normal range of motion.  Neurological: She is alert and oriented to person, place, and time.  Skin: Skin is warm and dry. She is not diaphoretic.    ED Course  Procedures (including critical care time)  Labs Reviewed  URINALYSIS, ROUTINE W  REFLEX MICROSCOPIC - Abnormal; Notable for the following:    Specific Gravity, Urine 1.004 (*)    Hgb urine dipstick MODERATE (*)    Leukocytes, UA LARGE (*)    All other components within normal limits  CBC WITH DIFFERENTIAL - Abnormal; Notable for the following:    WBC 14.4 (*)    Neutro Abs 10.9 (*)    All other components within normal limits  COMPREHENSIVE METABOLIC PANEL - Abnormal; Notable for the following:    Glucose, Bld 114 (*)    Total Bilirubin 0.2 (*)    All other components within normal limits  URINE MICROSCOPIC-ADD ON  - Abnormal; Notable for the following:    Squamous Epithelial / LPF FEW (*)    All other components within normal limits  POCT I-STAT, CHEM 8 - Abnormal; Notable for the following:    Glucose, Bld 120 (*)    Calcium, Ion 1.25 (*)    All other components within normal limits  URINE CULTURE   US Abdomen Complete  12/13/2012  *RADIOLOGY REPORT*  Clinical Data:  Right upper quadrant abdominal pain.  COMPLETE ABDOMINAL ULTRASOUND  Comparison:  None.  Findings:        Gallbladder:  normal, without stone, wall thickening, or       pericholecystic fluid.  Sonographic Murphy's sign was not       elicited.              Common bile duct:  normal, at 4 mm        Liver: Mildly increased in echogenicity.        IVC:  within normal limits.        Pancreas:  normal.        Spleen:  normal in size and echotexture.        Right Kidney:  11.6 cm.  No hydronephrosis.        Left Kidney:  11.7 cm.  No hydronephrosis.  Abdominal aorta:  Non aneurysmal without ascites.  IMPRESSION: Mild hepatic steatosis.  Otherwise normal abdominal ultrasound.   Original Report Authenticated By: Jeronimo Greaves, M.D.     1. Pyelonephritis    MDM   Marissa Jimenez is a 37 y.o. female who presented to the ED with dysuria, RUQ pain, and bilateral flank pain.  UA concerning for UTI.  Patient with some RUQ tenderness on exam.  No guarding.  [place] ordered and negative.  More TTP over flanks on reevaluation.  Feel that this is more consistent with pyelonephritis.  Will treat as outpatient.  Doubt appendicitis on exam.  No evidence of other serious intraabdominal pathology.  Return precautions given.  Will treat with two week course of antibiotics.  Patient safe for discharge.  Patient discharged.      Arloa Koh, MD 12/13/12 [phone]

## 2012-12-14 NOTE — ED Provider Notes (Signed)
I saw and evaluated the patient, reviewed the resident's note and I agree with the findings and plan. Patient with bilateral flank pain and dysuria. Denies fever or vomiting. Initially had some right upper quadrant pain but ultrasound is negative. UA is consistent with a UTI. Leukocytosis but rest of labs are within normal limits. Patient is tolerating by mouth's and she was discharged him with oral antibiotics  Gwyneth Sprout, MD 12/14/12 2326

## 2012-12-15 LAB — URINE CULTURE

## 2012-12-16 NOTE — ED Notes (Signed)
+   Urine Patient treated with Septra-sensitive to same-chart appended per protocol MD. 

## 2013-01-03 ENCOUNTER — Other Ambulatory Visit (HOSPITAL_COMMUNITY)
Admission: RE | Admit: 2013-01-03 | Discharge: 2013-01-03 | Disposition: A | Discharge status: Home / Self Care | Payer: No Typology Code available for payment source | Source: Ambulatory Visit | Attending: Family Medicine | Admitting: Family Medicine

## 2013-01-03 ENCOUNTER — Ambulatory Visit (INDEPENDENT_AMBULATORY_CARE_PROVIDER_SITE_OTHER): Payer: No Typology Code available for payment source | Admitting: Family Medicine

## 2013-01-03 ENCOUNTER — Encounter: Payer: Self-pay | Admitting: Family Medicine

## 2013-01-03 VITALS — BP 120/67 | HR 69 | Temp 98.3°F | Ht 63.0 in | Wt 209.8 lb

## 2013-01-03 DIAGNOSIS — Z113 Encounter for screening for infections with a predominantly sexual mode of transmission: Secondary | ICD-10-CM | POA: Insufficient documentation

## 2013-01-03 DIAGNOSIS — Z01419 Encounter for gynecological examination (general) (routine) without abnormal findings: Secondary | ICD-10-CM | POA: Insufficient documentation

## 2013-01-03 DIAGNOSIS — E669 Obesity, unspecified: Secondary | ICD-10-CM

## 2013-01-03 DIAGNOSIS — Z0000 Encounter for general adult medical examination without abnormal findings: Secondary | ICD-10-CM

## 2013-01-03 DIAGNOSIS — Z1151 Encounter for screening for human papillomavirus (HPV): Secondary | ICD-10-CM | POA: Insufficient documentation

## 2013-01-03 DIAGNOSIS — Z124 Encounter for screening for malignant neoplasm of cervix: Secondary | ICD-10-CM

## 2013-01-03 DIAGNOSIS — N760 Acute vaginitis: Secondary | ICD-10-CM

## 2013-01-03 LAB — LIPID PANEL
Cholesterol: 180 mg/dL (ref 0–200)
Triglycerides: 138 mg/dL (ref <150)

## 2013-01-03 LAB — CBC
MCH: 30.2 pg (ref 26.0–34.0)
MCHC: 33.4 g/dL (ref 30.0–36.0)
MCV: 90.4 fL (ref 78.0–100.0)
Platelets: 300 10*3/uL (ref 150–400)
RDW: 13.8 % (ref 11.5–15.5)

## 2013-01-03 LAB — BASIC METABOLIC PANEL
BUN: 13 mg/dL (ref 6–23)
Chloride: 102 mEq/L (ref 96–112)
Creat: 0.55 mg/dL (ref 0.50–1.10)
Glucose, Bld: 101 mg/dL — ABNORMAL HIGH (ref 70–99)
Potassium: 4.2 mEq/L (ref 3.5–5.3)

## 2013-01-03 LAB — POCT WET PREP (WET MOUNT)

## 2013-01-03 NOTE — Patient Instructions (Addendum)
Regrese a Glass blower/designer en un ao para el siguiente examen fsico anual.  Vamos a llamar o enviar una carta con los [place].

## 2013-01-03 NOTE — Progress Notes (Signed)
  Subjective:     Marissa Jimenez is a 37 y.o. female and is here for a comprehensive physical exam. The patient reports her last pap smear was negative 2 years ago.  She also wants to have lab work done.  Patient complains of pelvic pain not always related to menstrual cycle.  Denies vaginal bleeding.  She does have thin, clear vaginal discharge, but no itching.  She did have endorse dysuria and went to ER, she was treated with antibiotic.  She also wants to get ears checked because she has a constant ringing in ear.   History   Social History  . Marital Status: Married    Spouse Name: N/A    Number of Children: N/A  . Years of Education: N/A   Occupational History  . Not on file.   Social History Main Topics  . Smoking status: Never Smoker   . Smokeless tobacco: Never Used  . Alcohol Use: No  . Drug Use: No  . Sexually Active: Yes    Birth Control/ Protection: Surgical   Other Topics Concern  . Not on file   Social History Narrative   Lives in [place] with husband and three children and nephew.  Stay at home mother.   Health Maintenance  Topic Date Due  . Influenza Vaccine  06/09/1976  . Tetanus/tdap  12/07/2013  . Pap Smear  09/04/2014    Review of Systems Per HPI  Objective:    BP 120/67  Pulse 69  Temp(Src) 98.3 F (36.8 C) (Oral)  Ht 5\' 3"  (1.6 m)  Wt 209 lb 12.8 oz (95.165 kg)  BMI 37.17 kg/m2  LMP 12/01/2012 General appearance: alert, cooperative and distracted Head: Normocephalic, without obvious abnormality, atraumatic Eyes: conjunctivae/corneas clear. PERRL, EOM's intact. Fundi benign. Ears: cerumen impaction bilaterally; unable to visualize TM on RT Throat: lips, mucosa, and tongue normal; teeth and gums normal Lungs: clear to auscultation bilaterally Breasts: normal appearance, no masses or tenderness Heart: regular rate and rhythm, S1, S2 normal, no murmur, click, rub or gallop Abdomen: soft, non-tender; bowel sounds normal; no  masses,  no organomegaly Skin: Skin color, texture, turgor normal. No rashes or lesions Neurologic: Grossly normal  Genital: thin vaginal discharge, normal cervix, no masses or adnexal tenderness  Assessment:    Healthy female exam with pap smear and cervical cultures.     Plan:

## 2013-01-03 NOTE — Assessment & Plan Note (Signed)
CBC, BMET, and lipid panel today.  Pap performed.  Will notify of results.

## 2013-01-03 NOTE — Assessment & Plan Note (Signed)
Will check wet prep and cultures today in addition to annual pap smear.

## 2013-01-05 ENCOUNTER — Encounter: Payer: Self-pay | Admitting: Family Medicine

## 2013-05-30 ENCOUNTER — Ambulatory Visit (INDEPENDENT_AMBULATORY_CARE_PROVIDER_SITE_OTHER): Payer: Self-pay | Admitting: Family Medicine

## 2013-05-30 ENCOUNTER — Encounter: Payer: Self-pay | Admitting: Family Medicine

## 2013-05-30 VITALS — BP 139/79 | HR 82 | Temp 98.2°F | Wt 204.5 lb

## 2013-05-30 DIAGNOSIS — R300 Dysuria: Secondary | ICD-10-CM

## 2013-05-30 DIAGNOSIS — N390 Urinary tract infection, site not specified: Secondary | ICD-10-CM

## 2013-05-30 DIAGNOSIS — R109 Unspecified abdominal pain: Secondary | ICD-10-CM

## 2013-05-30 LAB — POCT URINALYSIS DIPSTICK

## 2013-05-30 LAB — POCT UA - MICROSCOPIC ONLY

## 2013-05-30 MED ORDER — CEPHALEXIN 500 MG PO CAPS: 500.0000 mg | ORAL_CAPSULE | Freq: Two times a day (BID) | ORAL | Status: DC

## 2013-05-30 MED ORDER — IBUPROFEN 600 MG PO TABS: 600.0000 mg | ORAL_TABLET | Freq: Three times a day (TID) | ORAL | Status: DC | PRN

## 2013-05-30 NOTE — Patient Instructions (Addendum)
Infección urinaria   (Urinary Tract Infection)   Una infección urinaria puede ocurrir en cualquier lugar del tracto urinario. El tracto incluye los riñones, uréteres, la vejiga y la uretra. La causa es un germen llamado bacteria. La infección urinaria mejora con antibióticos.   CUIDADOS EN EL HOGAR   · Si le recetaron antibióticos, tómelos como le haya indicado el médico. Tómelos todos, aunque se sienta mejor.  · Beba gran cantidad de líquido para mantener el pis (orina) de tono claro o amarillo pálido.  · Evite el té, las bebidas con cafeína y las bebidas gaseosas (carbonatada).  · Orine con frecuencia. Evite retener la orina durante mucho tiempo.  · Orine antes y después de tener sexo (relaciones sexuales).  · Si es mujer, higienícese desde adelante hacia atrás después de ir de cuerpo (mover el intestino). Use sólo un papel tissue por vez.  SOLICITE AYUDA DE INMEDIATO SI:   · Siente dolor en la espalda.  · Siente un dolor en el vientre (abdominal) muy intenso.  · Tiene escalofríos.  · Tiene malestar estomacal (náuseas).  · Vomita.  · El ardor o las molestias al orinar no desaparecen.  · Tiene fiebre.  · Los síntomas no mejoran después de 3 días.  ASEGÚRESE DE QUE:   · Comprende estas instrucciones.  · Controlará su enfermedad.  · Solicitará ayuda de inmediato si no mejora o si empeora.  Document Released: 03/15/2010 Document Revised: 06/19/2012  ExitCare® Patient Information ©2014 [employer].

## 2013-05-31 NOTE — Assessment & Plan Note (Signed)
She has had this chronic issue for 2 years. She does not do any repetitive movements. She currently takes OTC ibuprofen, which moderately relieves her symptoms. I prescribed Ibuprofen 600mg . She may require CT for possibility of lithiasis, however, from exam, musculoskeletal cause of pain seems more plausible.

## 2013-05-31 NOTE — Progress Notes (Signed)
  Subjective:    Patient ID: Marissa Jimenez, female    DOB: 06-16-1976, 37 y.o.   MRN: [id number]  Dysuria  This is a new problem. Episode onset: 5 days. The quality of the pain is described as burning. There has been no fever. There is no history of pyelonephritis. Associated symptoms include flank pain, frequency and hesitancy. Pertinent negatives include no chills, hematuria, nausea or vomiting. She has tried nothing for the symptoms. Her past medical history is significant for recurrent UTIs.  Flank Pain This is a chronic problem. The current episode started more than 1 year ago. The problem occurs intermittently. The problem is unchanged. Pain location: Right flank. The quality of the pain is described as aching. The pain does not radiate. The symptoms are aggravated by bending. Associated symptoms include dysuria. Pertinent negatives include no abdominal pain or fever. She has tried NSAIDs and home exercises for the symptoms. The treatment provided moderate relief.      Review of Systems  Constitutional: Negative for fever and chills.  Cardiovascular: Negative for palpitations.  Gastrointestinal: Negative for nausea, vomiting, abdominal pain and diarrhea.  Genitourinary: Positive for dysuria, hesitancy, frequency and flank pain. Negative for hematuria, vaginal bleeding and vaginal discharge.       Objective:   Physical Exam  Constitutional: She appears well-developed.  Cardiovascular: Normal rate, regular rhythm and normal heart sounds.   Pulmonary/Chest: Effort normal and breath sounds normal.  Abdominal: There is no CVA tenderness.  Musculoskeletal:  Right flank tenderness   Urinalysis Color: Orange Clarity: Clear Leukocytes: Trace RBC: Trace-intact Epithelial cells: 1-5 WBC: 10-20 Bacteria: 2+      Assessment & Plan:

## 2013-05-31 NOTE — Assessment & Plan Note (Signed)
Urinalysis with symptoms suggestive of UTI. Started Keflex 500mg  BID x14 days. I instructed Marissa Jimenez to finish the entire treatment. I also counseled her in the importance of wiping from front to back once and switching tissues to reduce the likelihood of recurrent infection. She voiced understanding.

## 2013-06-30 ENCOUNTER — Ambulatory Visit: Payer: Self-pay | Admitting: Family Medicine

## 2013-08-04 ENCOUNTER — Ambulatory Visit (INDEPENDENT_AMBULATORY_CARE_PROVIDER_SITE_OTHER): Payer: No Typology Code available for payment source | Admitting: Family Medicine

## 2013-08-04 VITALS — BP 126/57 | HR 82 | Temp 97.9°F | Ht 63.0 in | Wt 205.8 lb

## 2013-08-04 DIAGNOSIS — M7550 Bursitis of unspecified shoulder: Secondary | ICD-10-CM | POA: Insufficient documentation

## 2013-08-04 DIAGNOSIS — M542 Cervicalgia: Secondary | ICD-10-CM | POA: Insufficient documentation

## 2013-08-04 DIAGNOSIS — M25519 Pain in unspecified shoulder: Secondary | ICD-10-CM

## 2013-08-04 DIAGNOSIS — M7552 Bursitis of left shoulder: Secondary | ICD-10-CM

## 2013-08-04 DIAGNOSIS — M75100 Unspecified rotator cuff tear or rupture of unspecified shoulder, not specified as traumatic: Secondary | ICD-10-CM

## 2013-08-04 DIAGNOSIS — M25512 Pain in left shoulder: Secondary | ICD-10-CM

## 2013-08-04 HISTORY — DX: Bursitis of unspecified shoulder: M75.50

## 2013-08-04 MED ORDER — METHYLPREDNISOLONE ACETATE 80 MG/ML IJ SUSP
80.0000 mg | Freq: Once | INTRAMUSCULAR | Status: AC
  Administered 2013-08-04: 80 mg via INTRA_ARTICULAR

## 2013-08-04 NOTE — Assessment & Plan Note (Signed)
Assessment: likely 2/2 trapezius trigger points, no evidence of systemic illness Plan: encouraged regular exercise and massage with tennis ball

## 2013-08-04 NOTE — Patient Instructions (Signed)
Estos son ejercicios que deben fortalecer su brazo y los hombros . Asegrese de Chemical engineer a diario para los prximos 2-3 semanas .   La inestabilidad anterior del hombro es una afeccin caracterizada por una luxacin recurrente de la articulacin del hombro. La luxacin es una lesin en la que dos huesos adyacentes dejan de estar en la alineacin correcta y las superficies de la articulacin no se tocan. La subluxacin es una lesin similar pero en este caso las superficies de la articulacin continan en contacto. Las luxaciones y subluxaciones de la articulacin del hombro (glenohumeral) generalmente implican que el hueso de la zona superior del brazo (hmero) se desplaza hacia adelante. La articulacin del hombro permite un mayor movimiento que otras articulaciones del cuerpo, y debido a esto tiene Copy posibilidades de Copywriter, advertising. Cuando la articulacin glenohumeral est luxada o subluxada, los msculos que [employer] tendones de la articulacin del hombro (manguito rotador), se Brewing technologist. Las lesiones repetitivas dan como resultado que la articulacin del hombro se suelte y se produzca la inestabilidad. Estas lesiones tambin pueden causar un desgarro en el cartlago que alinea la articulacin y ayuda a Pharmacologist la cabeza del hmero (labrum) en su sitio. SNTOMAS  Dolor intenso en el hombro cuando la articulacin est dislocada o subluxada.  Debilidad en el hombro, dolor o inflamacin.  Prdida de la funcin del hombro.  Dolor que empeora con la funcin del hombro, en especial el movimiento que implica el movimiento del brazo por encima de la altura del hombro.  Sensacin de debilidad o inestabilidad del hombro.  Signos de dao al nervio: Adormecimiento o parlisis.  Sensacin de Tax adviser un ruido o sonido de crack (crepitacin) al tocar la zona lesionada o con el movimiento del hombro. CAUSAS La inestabilidad anterior del hombro es una lesin que est causada por una luxacin o  subluxacin en la articulacin glenohumeral. Los mecanismos ms frecuentes de una lesin son:  Traumatismo directo en la articulacin del hombro.  Movimiento repetido o estresante de la articulacin del hombro, en especial aquellos en que se debe mover el brazo por encima de la altura del hombro.  Esguince de los ligamentos de la articulacin del hombro.  Anormalidad con la que se nace (es congnita) Aqu se incluyen las superficies de la articulacin poco profundas o mal formadas. LOS RIESGOS AUMENTAN CON:  Deportes de contacto (ftbol, lucha o bsquet)  Actividades que implican movimientos repetidos o estresantes de la articulacin del hombro, en especial aquellos en que se debe mover el brazo por encima de la altura del hombro (bisbol, vley o natacin).  Lesiones previas en el hombro.  Poca fuerza y flexibilidad. PREVENCIN  Precalentamiento adecuado y elongacin antes de la [place].  Descanso y recuperacin entre actividades.  Mantener la forma fsica:  [place], flexibilidad y resistencia muscular.  Capacidad cardiovascular.  Aprenda y [employer]AA. Cuando sea posible, tener un entrenador que corrija la Administrator.  Use el equipo protector adecuado y [employer]. PRONSTICO La extensin de la recuperacin y la probabilidad de luxaciones y subluxaciones futuras depende del dao que se haya producido en el hombro.  COMPLICACIONES RELACIONADAS  Lesin en los nervios o en los principales vasos sanguneos de la zona, lo que causa adormecimiento, enfriamiento o palidez.  Dao a uno de los [employer] o Dietitian de la articulacin del hombro.  Inestabilidad permanente del hombro.  Desgarro de uno de los tendones del manguito rotador.  Artritis del hombro. TRATAMIENTO Cuando la articulacin del hombro este dislocada deber  realinearse (reducirse) por un especialista. En ocasiones la reduccin no puede realizarse de forma manual, y requiere  Leisure centre manager. Luego de la reduccin, el tratamiento incluye el uso de medicamentos y la aplicacin de hielo para reducir Chief Technology Officer y la inflamacin. El hombro debe estar inmovilizado con un cabestrillo durante 3 a 8 semanas para permitir la curacin de Nurse, learning disability. Luego de la inmovilizacin es importante completar los ejercicios de fortalecimiento y elongacin para recobrar la fuerza y la amplitud de movimientos. Los ejercicios pueden Management consultant o con un terapeuta. La ciruga se reserva para aquellos pacientes que han sufrido mltiples luxaciones debido a la inestabilidad del hombro. MEDICAMENTOS  Puede utilizarse anestesia general o relajantes musculares para ayudar a la reduccin de la articulacin del hombro.  Si necesita analgsicos, se recomiendan los antiinflamatorios no esteroides, como aspirina e ibuprofeno y Cabin crew, como acetaminofeno  No tome medicamentos para Chief Technology Officer dentro de los [address] previos a la [place].  Los analgsicos prescriptos se indicarn si el mdico lo considera necesario. Utilcelos como se le indique y slo cuando lo necesite. TERAPIA FRA El tratamiento con fro [employer] y reduce la inflamacin. El fro debe aplicarse durante 10 a 15 minutos cada 2  3 horas para reducir la inflamacin y Chief Technology Officer e inmediatamente despus de cualquier actividad que agrava los sntomas. Utilice bolsas o un masaje de hielo. SOLICITE ATENCIN MDICA SI:  El tratamiento parece no ofrecer benficios, o el trastorno [employer].  Los medicamentos producen efectos secundarios.  Ocurren complicaciones por la ciruga:  Dolor, adormecimiento o fro en la extremidad.  Decoloracin de las uas (se vuelven azules o grises) de la extremidad.  Signos de infeccin (fiebre, enrojecimiento, la hinchazn o hemorragia persistente). EJERCICIOS EJERCICIOS DE AMPLITUD DE MOVIMIENTOS Y ELONGACIN Inestabilidad anterior del hombro  Estos ejercicios lo ayudarn a Pharmacist, hospital  movilidad del hombro una vez que el mdico le indique que ha finalizado el perodo de inmovilizacin de 2 a 6 semanas. No comience con los ejercicios antes que el mdico lo indique, porque podr Cytogeneticist curacin. Los sntomas podrn aliviarse con o sin asistencia adicional de su mdico, fisioterapeuta o Herbalist. Al completar estos ejercicios, recuerde:   Restaurar la flexibilidad del tejido ayuda a que las articulaciones recuperen el movimiento normal. Esto permite que el movimiento y la actividad sean ms saludables y menos dolorosos.  Para que sea efectiva, cada elongacin debe realizarse durante al menos 30 segundos. La elongacin nunca debe ser dolorosa. Deber sentir slo un alargamiento o distensin suave del tejido que estira.  Durante la recuperacin, evite las actividades o ejercicios que impliquen acciones que le obliguen a Scientific laboratory technician la mano o el codo Sales executive / izquierdo encima de la cabeza o detrs de la espalda o la cabeza. Estas posturas tensionan los tejidos que estn tratando de Barrister's clerk. AMPLITUD DE MOVIMIENTOS Pndulo  Inclnese a nivel de la cintura de modo que su brazo derecha / izquierdo se separe del cuerpo. Sostngase con la otra mano sobre una superficie slida, como una mesa o mesada de cocina.  Su brazo derecha / izquierdo debe quedar perpendicular al [employer]. Si no est perpendicular, debe inclinarse un poco ms. Relaje los msculos del brazo y el hombro derecha / izquierdo tanto como pueda.  Menee suavemente las caderas y el tronco de modo que su brazo Sales executive / izquierdo se mueva sin Chemical engineer los msculos del hombro.  Aumente los movimientos de modo que su brazo se mueva de un lado a  otro, luego hacia adelante y [employer] atrs y finalmente en sentido de las agujas del reloj y luego en sentido contrario a las mismas.  Haga __________ repeticiones en cada direccin. Muchas personas Retail buyer ejercicio para [employer], y para aumentar la amplitud de  movimientos. Reptalo __________ veces. Realice este ejercicio __________ veces por da. ELONGACIN Flexin - Sentado  Sintese en una silla firme de modo que su antebrazo derecha / izquierdo pueda descansar sobre una mesa o mesada de cocina. Su codo debe descansar por debajo de la altura del hombro, de modo que el hombro se sienta sostenido y no tenso o incmodo.  Mantenga el hombro derecha / izquierdo relajado, inclnese hacia adelante desde la cintura, permitiendo que su mano se deslice hacia adelante. Inclnese hacia adelante hasta sentir un estiramiento moderado en el hombro, pero antes de sentir que Teacher, music.  Mantenga esta posicin durante __________ segundos. Vuelva lentamente a la posicin inicial. Reptalo __________ veces. Realice este ejercicio __________ veces por da.  ELONGACIN Flexin [employer] de pie con una buena postura. Tome un palo de escoba o caa con una palma derecha / izquierdo hacia abajo y la otra palma hacia arriba de modo que sus manos tengan una separacin de un poco ms que el ancho de sus hombros.  Mantenga su codo derecha / izquierdo recto y los msculos de los hombros [place], y [employer] palo con su mano opuesta para elevar su brazo frente a su cuerpo y [employer] cabeza. Levante el brazo hasta que sientas un estiramiento en el hombro derecha / izquierdo, pero sin sentir un aumento del dolor.  Evite encoger su hombro cuando eleve el brazo manteniendo el omplato hacia abajo y hacia la columna vertebral en la zona media de la espalda. Mantenga esta posicin durante __________ segundos.  Vuelva lentamente a la posicin inicial. Reptalo __________ veces. Realice este ejercicio __________ veces por da.  FUERZA [place]duccin, supina  Pngase de pie con una buena postura. Tome un palo de escoba o caa con una palma derecha / izquierdo hacia abajo y la otra palma hacia arriba de modo que sus manos tengan una separacin de un poco ms que el ancho  de sus hombros..  Mantenga su codo Sales executive / izquierdo recto y los msculos de los hombros [place], y [place] palo con su mano opuesta para elevar su brazo derecha / izquierdo hacia un lado de su cuerpo y [employer] la cabeza. Eleve el brazo [employer] sentir un estiramiento en su hombro, pero detngase antes de sentir que Teacher, music.  Evite encoger su hombro cuando eleve el brazo manteniendo el omplato hacia abajo y hacia la columna vertebral en la zona media de la espalda. Mantenga esta posicin durante __________ segundos.  Vuelva lentamente a la posicin inicial. Reptalo __________ veces. Realice este ejercicio __________ veces por da.  Document Released: 07/12/2006 Document Revised: 12/18/2011 [facility] Patient Information 2014 [place], [place].

## 2013-08-04 NOTE — Progress Notes (Signed)
  Subjective:    Patient ID: Marissa Jimenez, female    DOB: 1976-02-28, 37 y.o.   MRN: [id number]  HPI  37 year old F with neck pain, back pain and shoulder pain. It does not radiate. It has been present for years. It starts several hours after waking and is present throughout the day. She denies and fever, weight loss, or joint swelling. She denies any trauma. The pain is worsened by doing housework. She is specifically concerned about osteoporosis, since her mother was recent diagnosed with this.   Visit Conducted in Spanish with an interpreter   Review of Systems See HPI    Objective:   Physical Exam BP 126/57  Pulse 82  Temp(Src) 97.9 F (36.6 C) (Oral)  Ht 5\' 3"  (1.6 m)  Wt 205 lb 12.8 oz (93.35 kg)  BMI 36.46 kg/m2  Gen: well appearing, non distressed, obese Hispanic female Neck: normal ROM Back: no scoliosis, tenderness with scattered trigger points of trapezius and lumbar paraspinal muscles  Shoulder Exam - left Inspection:  Palpation:   Clavicle:  Normal, non tender  AC Joint:  Normal, non tender  Scapula:  Normal, non tender             Biceps Tendon:  Normal, non tender ROM/Strength:  Abduction (Suprapsinatus): limited by pain  Internal Rotation/Liftoff (Subsapularis):normal  External Rotation (Infraspinatus/Teres Minor): normal Maneuvers:   Neer's: Negative  Hawkin's: Positive  Empty Can/Jobes for supraspinatus: Negative Obrien's test: Positive       Assessment & Plan:  Subacromial Injection: left side  Consent obtained and time out performed.  Area cleaned with alcohol and betadine.  40Mg  of depomedrol and 4ml of 0.5% marcaine was injected into the subacromial bursa without complication or bleeding. Patient tolerated the procedure well.

## 2013-08-04 NOTE — Assessment & Plan Note (Signed)
Assessment: pain on exam consistent with bursitis Plan: subacromial steroid injection performed, given exercises for strengthening left rotator cuff

## 2013-11-04 ENCOUNTER — Emergency Department (HOSPITAL_COMMUNITY)
Admission: EM | Admit: 2013-11-04 | Discharge: 2013-11-04 | Disposition: A | Discharge status: Home / Self Care | Payer: No Typology Code available for payment source | Source: Home / Self Care | Attending: Family Medicine | Admitting: Family Medicine

## 2013-11-04 ENCOUNTER — Encounter (HOSPITAL_COMMUNITY): Payer: Self-pay | Admitting: Emergency Medicine

## 2013-11-04 DIAGNOSIS — R51 Headache: Secondary | ICD-10-CM

## 2013-11-04 DIAGNOSIS — R519 Headache, unspecified: Secondary | ICD-10-CM

## 2013-11-04 MED ORDER — DICLOFENAC SODIUM 75 MG PO TBEC: 75.0000 mg | DELAYED_RELEASE_TABLET | Freq: Two times a day (BID) | ORAL | Status: DC | PRN

## 2013-11-04 NOTE — ED Notes (Signed)
Pt c/o constant throbbing HA onset 3 months Sxs also include: bilateral ear pain and upper back pain Denies f/v/n/d, cold sxs Reports she went to [facility] for similar sxs back in 10/14 She is alert w/no signs of acute distress.

## 2013-11-04 NOTE — Discharge Instructions (Signed)
Gracias por venir hoy.  Regresse con [facility].  Tome diclofenac cada 12 horas.   Dolor de Training and development officercabeza, preguntas frecuentes y sus respuestas (Headaches, Frequently Asked Questions) CEFALEAS MIGRAOSAS P: Qu es la migraa? Qu la ocasiona? Cmo puedo tratarla? R: En general, la migraa comienza como un dolor apagado. Luego progresa hacia un dolor, constante, punzante y como un latido. Sentir Scientist, physiologicaleste dolor en las sienes. Podr sentir Radiographer, therapeuticdolor en la parte anterior o posterior de la cabeza, o en uno o ambos lados. El dolor suele estar acompaado de una combinacin de:  Nuseas.  Vmitos.  Sensibilidad a la luz y los ruidos. Algunas personas (un 15%) experimentan un aura (ver abajo) antes de un ataque. La causa de la migraa se debe a reacciones qumicas del cerebro. El tratamiento para la migraa puede incluir medicamentos de [place] Islandventa libre. Tambin puede incluir tcnicas de Peruautoayuda. Estas incluyen entrenamientos para la relajacin y biorretroalimentacin.  P: Qu es un aura? R: Alrededor del 15% de las personas con migraa tiene un "aura". Es una seal de sntomas neurolgicos que ocurren antes de un dolor de cabeza por migraas. Podr ver lneas onduladas o irregulares, puntos o luces parpadeantes. Podr experimentar visin de tnel o puntos ciegos en uno o ambos ojos. El aura puede incluir alucinaciones visuales o auditivas (algo que se imagina). Puede incluir trastornos en el olfato (como olores extraos), el tacto o el gusto. Entre otros sntomas se incluyen:  Adormecimiento.  Sensacin de hormigueo.  Dificultad para recordar o Occupational hygienistdecir la palabra correcta. Estos episodios neurolgicos pueden durar hasta 60 minutos. Los sntomas desaparecern a medida que el dolor de cabeza comience. P:Qu es un disparador? R: Ciertos factores fsicos o Sports administratorambientales pueden llevar a "disparar" una migraa. Estos son:  Alimentos.  Cambios hormonales.  Clima.  Estrs. Es importante recordar que los  disparadores son diferentes entre si. Para ayudar a prevenir ataques de migraas, necesitar descubrir cules son los Psychologist, prison and probation servicesdisparadores que le afectan. Lleve un diario sobre sus dolores de Turkmenistancabeza. Este es un buen modo para descubrir los disparadores. El Sales executivediario le ayudar en el momento de hablar con el profesional acerca de su enfermedad. P: El clima afecta en las migraas? R: La luz solar, el calor, la humedad y lo cambios drsticos en la presin Software engineerbaromtrica pueden llevar a, o "disparar" un ataque de migraa en Time Warneralgunas personas. Pero estudios han demostrado que el clima no acta como disparador para todas las personas con Merrydalemigraa. P: Cul es la relacin entre la migraa y la hormonas? R: Las hormonas inician y regulan muchas de las funciones corporales. Las hormonas Bank of New York Companymantienen el balance en el cuerpo dentro de los constantes cambios de [place] Homeambiente. Algunas veces, el nivel de hormonas en el cuerpo se desbalancea. Por ejemplo, durante la menstruacin, el embarazo o la Gracevillemenopausia. Pueden ser la causa de un ataque de migraa. De hecho, alrededor de tres cuartos de las mujeres con migraa informan que sus ataques estn relacionados con el ciclo menstrual.  P: Aumenta el riesgo de sufrir un choque cardaco en las personas que padecen migraa? R: La probabilidad de que un ataque de migraa ocasione un ataque cardaco es muy remota. Esto no quiere Limited Brandsdecir que una persona que sufre de migraa no pueda tener un ataque cardaco asociado con ella. En las personas menores de 40 aos, el factor ms comn para un ataque es la [place]villemigraa. Pero durante la vida de una persona, la ocurrencia de un dolor de cabeza por migraa est asociada con una reduccin en el  riesgo de morir por un ataque cerebrovascular.  P: Cules son los medicamentos para la migraa? R: La medicacin precisa se [place] para tratar el dolor de cabeza una vez que ha comenzado. Son ejemplos, medicamentos de [place], desinflamatorios sin esteroides, ergotamnicos  y triptanos.  P: Qu son los triptanos? R: Lo triptanos son Rodena Piety clase de medicamentos abortivos. Son especficos para tratar este problema. Los triptanos son vasoconstrictores. Moderan algunas reacciones qumicas del cerebro. Los triptanos trabajan como receptores del cerebro. Ayudan a Doctor, general practice de un neurotransmisor denominado serotonina. Se cree que las fluctuaciones en los [place] de serotonina son la causa principal de la migraa.  P: Son [employer] Parcel de venta libre para la migraa? R: Los medicamentos de [employer] pueden ser efectivos para Public house manager dolores leves a moderados y los sntomas asociados a la [place]. Pero deber consultar a un mdico antes de Oncologist tratamiento para la migraa.  P: Cules son los medicamentos de prevencin de la migraa? R: Se suele denominar tratamiento "profilctico" a los medicamentos para la prevencin de la migraa. Se utilizan para reducir Scientist, forensic, gravedad y duracin de los ataques de [place]. Son ejemplos de medicamentos de prevencin: antiepilpticos, antidepresivos, bloqueadores beta, bloqueadores de los canales de calcio y medicamentos antiinflamatorios sin esteroides. P:  Por qu se utilizan anticonvulsivantes para tratar la migraa? R: Durante los ltimos aos, ha habido un creciente inters en las drogas antiepilpticas para la prevencin de la migraa. A menudo se los conoce como "anticonvulsivantes". La epilepsia y la migraa suceden por reacciones similares en el cerebro.  P:  Por qu se utilizan antidepresivos para tratar la migraa? R: Los antidepresivos tpicamente se [place] para tratar a las personas con depresin. Pueden reducir la frecuencia de la migraa a travs de la regulacin de los niveles qumicos, como la serotonina, en el cerebro.  P:  Por qu se utilizan terapias alternativas para tratar la migraa? R: El trmino "terapias alternativas" suelen utilizarse para describir los  tratamientos que se considera que estn por fuera de Doctor, hospital la medicina occidental convencional. Son ejemplos de las terapias alternativas: la acupuntura, la acupresin y el yoga. Otra terapia alternativa comn es la terapia herbal. Se cree que algunas hierbas ayudan a [employer] de [place]. Siempre consulte con [employer]na acerca de las terapias alternativas antes de [place]. Algunos productos herbales contienen arsnico y [place] toxinas. DOLORES DE CABEZA POR TENSIN P: Qu es un dolor de cabeza por tensin? Qu lo ocasiona? Cmo puedo tratarlo? R: Los dolores de cabeza por tensin ocurren al azar. A menudo son el resultado de estrs temporario, ansiedad, fatiga o ira. Los sntomas Research scientist (physical sciences) en las sienes, una sensacin como de tener una banda alrededor de la cabeza (un dolor que "presiona"). Los sntomas pueden incluir una sensacin de [place], de presin y Writer de los msculos de la cabeza y el cuello. El dolor comienza en la frente, sienes o en la parte posterior de la cabeza y el cuello. El tratamiento para los dolores de cabeza por tensin puede incluir medicamentos de [place]. Tambin puede incluir tcnicas de [place] con entrenamientos para la relajacin y biorretroalimentacin. CEFALEA EN RACIMOS P: Qu es una cefalea en racimos? Qu la ocasiona? Cmo puedo tratarla? R: La cefalea en racimos toma su nombre debido a que los ataques vienen en grupos. El dolor aparece con poco o ningn aviso. Normalmente ocurre de un lado de la cabeza. Muchas veces el dolor viene acompaado de un lagrimeo u ojo  rojo y goteo de la nariz del mismo lado que Conservation officer, historic buildings. Se cree que la causa es una reaccin en las sustancias qumicas del cerebro. Se describe como el caso ms grave e intenso de cualquier tipo de dolor de cabeza. El tratamiento incluye medicamentos bajo receta y oxgeno. CEFALEA SINUSAL P: Qu es una cefalea sinusal? Qu la ocasiona? Cmo puedo tratarla? R: Cuando se  inflama una cavidad en los huesos de la cara y el crneo (sinus) ocasiona un dolor localizado. Esta enfermedad generalmente es el resultado de una reaccin alrgica, un tumor o una infeccin. Si el dolor de cabeza est ocasionado por un bloqueo del sinus, como una infeccin, probablemente tendr [place]. Una imagen de rayos X confirmar el bloqueo del sinus. El tratamiento indicado por el mdico podr incluir antibiticos para la infeccin, y tambin antihistamnicos o descongestivos.  DOLOR DE CABEZA POR EFECTO "REBOTE" P: Qu es un dolor de cabeza por efecto "rebote"? Qu lo ocasiona? Cmo puedo tratarlo? R: Si se toman medicamentos para el dolor de cabeza muy a menudo puede llevar a la enfermedad conocida como "dolor de cabeza por rebote". Un patrn de abuso de medicamentos para el dolor de cabeza supone tomarlos ms de [employer] por semana o en cantidades excesivas. Esto significa ms que lo que indica el envase o el mdico. Con los dolores de cabeza por rebote, los medicamentos no slo dejan de Best boy sino que adems comienzan a Electronics engineer dolores de [place]. Los mdicos tratan los dolores de cabeza por rebote mediante la disminucin del medicamento del que se ha abusado. A veces el medico podr sustituir gradualmente por un tipo diferente de tratamiento o medicacin. Dejar de consumirlo podra ser difcil. El abuso regular de un medicamento aumenta el potencial que se produzcan efectos secundarios graves. Consulte con un mdico si utiliza regularmente medicamentos para Conservation officer, historic buildings de cabeza ms de dos das por semana o ms de lo que indica el envase. PREGUNTAS Y RESPUESTAS ADICIONALES P: Qu es la biorretroalimentacin? R: La biorretroalimentacin es un tratamiento de [place]. La biorretroalimentacin utiliza un equipamiento especial para controlar los movimientos involuntarios del cuerpo y las respuestas fsicas. La biorretroalimentacin controla:  Respiracin.  Pulso.  Latidos  cardacos.  Temperatura.  Tensin muscular.  Actividad cerebrales. La biorretroalimentacin le ayudar a mejorar y Government social research officer sus ejercicios de Systems developer. Aprender a Chief Technology Officer las respuestas fsicas relacionadas con el estrs. Una vez que se dominan las tcnicas no necesitar ms el equipamiento. P: Son hereditarios los dolores de [place]? R: Segn algunas estimaciones, aproximadamente 28 millones de estadounidenses sufren migraa. Cuatro de cada cinco (80%) informan una historia familiar de migraa. Los investigadores no pueden asegurar si se trata de un problema gentico o Retail banker. A pesar de esto, un nio tiene 50% de probabilidades de sufrir migraa si uno de sus padres la sufre. El nio tiene un 75% de probabilidades si ambos padres la sufren.  P. Puede un nio tener migraa? R: En el momento de ingresar a la escuela secundaria, la mayora de los jvenes han experimentado algn tipo de cefalea. Algunos abordajes o medicamentos seguros y Location manager las cefaleas o detenerlas luego de que han comenzado.  Marita Snellen tipo de especialista debe ver para diagnosticar y tratar una cefalea? R: Comience con su mdico de cabecera. Luther Hearing de su experiencia y abordaje de las cefaleas. Comente los mtodos de clasificacin, diagnstico y [place]. El profesional decidir si lo derivar a Teaching laboratory technician, segn los sntomas u otras enfermedades. El  hecho de sufrir diabetes, alergias, etc, puede requerir un abordaje ms complejo. La National Headache Foundation (Fundacin Nacional para las [place]) Chief Technology Officer, a pedido, Physiological scientist lista de los mdicos que son miembros de [place]. Document Released: 09/07/2008 Document Revised: 12/18/2011 [facility] Patient Information 2014 [place], [place].

## 2013-11-04 NOTE — ED Provider Notes (Signed)
Marissa Jimenez is a 38 y.o. female who presents to Urgent Care today for 3 months of headache. Patient notes left-sided bandlike daily pain. The pain is worse during the day and typically somewhat better at night. She denies any weakness or numbness loss of function difficulty talking or swallowing. She has never had a headache like this before. She's tried ibuprofen which has not helped. She feels well otherwise.   Past Medical History  Diagnosis Date  . Arthritis 2012   History  Substance Use Topics  . Smoking status: Never Smoker   . Smokeless tobacco: Never Used  . Alcohol Use: No   ROS as above Medications: No current facility-administered medications for this encounter.   Current Outpatient Prescriptions  Medication Sig Dispense Refill  . diclofenac (VOLTAREN) 75 MG EC tablet Take 1 tablet (75 mg total) by mouth 2 (two) times daily as needed. spanish  60 tablet  0    Exam:  BP 116/72  Pulse 73  Temp(Src) 97.8 F (36.6 C) (Oral)  Resp 18  SpO2 98% Gen: Well NAD HEENT: EOMI,  MMM, PERRLA Nontender scalp Lungs: Normal work of breathing. CTABL Heart: RRR no MRG Abd: NABS, Soft. NT, ND Exts: Brisk capillary refill, warm and well perfused.  Neuro: Alert and oriented cranial nerves 2 -12 are intact normal coordination sensation gait balance and strength   Assessment and Plan: 38 y.o. female with chronic headache. Unclear etiology. Plan to treat with diclofenac. Followup with primary care provider for further evaluation and management. Patient may benefit from head  CT scan.  Discussed warning signs or symptoms. Please see discharge instructions. Patient expresses understanding.    Gregor Hams, MD 11/04/13 [phone]

## 2013-11-17 ENCOUNTER — Ambulatory Visit (INDEPENDENT_AMBULATORY_CARE_PROVIDER_SITE_OTHER): Payer: No Typology Code available for payment source | Admitting: Family Medicine

## 2013-11-17 VITALS — BP 119/79 | HR 58 | Temp 97.9°F | Ht 63.0 in | Wt 211.0 lb

## 2013-11-17 DIAGNOSIS — R51 Headache: Secondary | ICD-10-CM

## 2013-11-17 DIAGNOSIS — J309 Allergic rhinitis, unspecified: Secondary | ICD-10-CM

## 2013-11-17 DIAGNOSIS — G44209 Tension-type headache, unspecified, not intractable: Secondary | ICD-10-CM

## 2013-11-17 DIAGNOSIS — H9209 Otalgia, unspecified ear: Secondary | ICD-10-CM | POA: Insufficient documentation

## 2013-11-17 DIAGNOSIS — M542 Cervicalgia: Secondary | ICD-10-CM

## 2013-11-17 DIAGNOSIS — H9319 Tinnitus, unspecified ear: Secondary | ICD-10-CM

## 2013-11-17 DIAGNOSIS — H9313 Tinnitus, bilateral: Secondary | ICD-10-CM

## 2013-11-17 DIAGNOSIS — R519 Headache, unspecified: Secondary | ICD-10-CM | POA: Insufficient documentation

## 2013-11-17 DIAGNOSIS — M545 Low back pain, unspecified: Secondary | ICD-10-CM

## 2013-11-17 MED ORDER — FLUTICASONE PROPIONATE 50 MCG/ACT NA SUSP: 2.0000 | Freq: Every day | NASAL | Status: DC

## 2013-11-17 MED ORDER — CYCLOBENZAPRINE HCL 10 MG PO TABS: 10.0000 mg | ORAL_TABLET | Freq: Three times a day (TID) | ORAL | Status: DC | PRN

## 2013-11-17 MED ORDER — ASPIRIN-ACETAMINOPHEN-CAFFEINE 250-250-65 MG PO TABS: 1.0000 | ORAL_TABLET | Freq: Four times a day (QID) | ORAL | Status: DC | PRN

## 2013-11-17 NOTE — Assessment & Plan Note (Signed)
Unclear etiology at this point. May be linked to cerumen impaction. Flushed ears bilaterally with some removal of the right ear but not complete removal. Recommended the patient use the [place] a few days before she sees me back in a months and will repeat flushing at that time. If tinnitus doesn't improve will evaluate for other causes.

## 2013-11-17 NOTE — Assessment & Plan Note (Signed)
Likely tension headache. No red flags on exam. Would like to treat with Flexeril for the attention coming from her neck. We'll also start Excedrin. If Excedrin doesn't work patient knows to start back on Aleve. Stop diclofenac since this did not help her. Recommended keeping log of headaches and bring it back for followup visit in one-month Will also treat allergic rhinitis which may be contributing to some of her frontal head pain with Flonase. No indication for head imaging at this point.

## 2013-11-17 NOTE — Progress Notes (Signed)
Patient ID: Marissa Jimenez    DOB: August 15, 1976, 38 y.o.   MRN: [id number] --- Subjective:  Marissa Jimenez is a 38 y.o.female who presents for evaluation of headache, neck pain, back pain.  - HA: started 3 months ago. Located Throughout head. Needle sensation.  Lasts a few hours. Occurs every day. Improved with aleve. She states that she used to have headaches that would last 7-8 days it would improve. At this time the headache has lasted longer than usual. No nausea. Occasional Dizziness. No photophobia. Some phonophobia. No double vision.  Ringing in ears that has been ongoing for 6-7 months. It is associated with itching of her ears. No associated dizziness, lightheadedness. No recent stressors, anxiety, no depression, no feelings of sadness. Pain in neck: associated with headache. Better with lying down and massage. Also has had burning sensation in nose. No congestion. No rhinorrhea, no sore throat, no cough.  - low back pain: For the last 2 years. Worst with standing up for long periods of time. Better with laying down. No weakness in lower extremities. No urine or bowel incontinence  ROS: see HPI Past Medical History: reviewed and updated medications and allergies. Social History: Tobacco: none  Objective: Filed Vitals:   11/17/13 1035  BP: 119/79  Pulse: 58  Temp: 97.9 F (36.6 C)    Physical Examination:   General appearance - alert, well appearing, and in no distress Ears - cerumen obstructing right TM, cerumen present in left but TM visualized and normal Nose - nasal turbinates erythematous and congested bilaterally Mouth - mucous membranes moist, pharynx normal without lesions Neck - supple, no significant adenopathy Chest - clear to auscultation, no wheezes, rales or rhonchi, symmetric air entry Heart - normal rate, regular rhythm, normal S1, S2, no murmurs Neuro-cranial nerves II through XII grossly intact, pupils equal round and reactive to light and accommodation, attempted  to visualize optic disc but was unable to clearly visualize it bilaterally Normal grip strength, normal finger nose bilaterally Pain with palpation of left trapezius muscle Normal range of motion of neck Some tenderness with palpation of the frontal sinus Low back-no tenderness along lumbar spine or paraspinal muscles 55 strength with knee flexion and knee extension, hip flexion, dorsiflexion and plantar flexion bilaterally.

## 2013-11-17 NOTE — Patient Instructions (Signed)
Please follow up in 1 month.   Cefalea tensional  (Tension Headache)  Una cefalea tensional es una sensacin de dolor y opresin en la frente y los lados de la cabeza. El dolor puede ser sordo o puede sentirse que comprime (constrictivo). Este es el tipo ms comn de dolor de [place]. Generalmente no se asocian con nuseas o vmitos y no empeoran con la actividad fsica. Pueden durar desde 30 minutos a varios das.  CAUSAS  Se desconoce la causa exacta, pero puede ser causada por las sustancias qumicas y las hormonas del cerebro que producen dolor. Suelen comenzar despus de una situacin de estrs, ansiedad o por depresin. Otros desencadenantes pueden ser:   El alcohol.  Cafena (demasiada o abstinencia).  Infecciones respiratorias (resfriados, gripes, sinusitis).  Problemas dentales o apretar los dientes.  Fatiga.  Mantener la cabeza y el cuello en una posicin demasiado tiempo mientras utiliza un ordenador. SNTOMAS   Presin alrededor de la cabeza.   Dolor "sordo" en la cabeza.   Dolor que siente sobre la frente y los lados de la cabeza.   Sensibilidad en los msculos de la cabeza, del cuello y de los hombros. DIAGNSTICO  El diagnstico se realiza en base a:   Sntomas.   Examen fsico.   Ardelia Mems TC (tomografa computada) o resonancia magntica de la cabeza. Se pueden pedir estas pruebas, si los sntomas son severos o inusuales. TRATAMIENTO  Le recetarn medicamentos que ayudan a [employer].  INSTRUCCIONES PARA EL CUIDADO EN EL HOGAR   Slo tome medicamentos de venta libre o recetados para Glass blower/designer o Health and safety inspector, segn las indicaciones de su mdico.   Cuando sienta dolor de cabeza acustese en un cuarto oscuro y tranquilo.   Lleve un registro diario para Neurosurgeon lo que [employer] dolores de [place]. Por ejemplo, escriba:  Lo que come y bebe.  Cunto tiempo duerme.  Todo cambio en la dieta o medicamentos.  Trate con masajes u  otras tcnicas de relajacin.   Pueden utilizarse bolsas de hielo o calor aplicadas a la cabeza o al cuello. selos 3 a 4 veces por da de 15 a 20 minutos por vez, o como sea necesario.   Limite las situaciones de estrs.   Sintese con la espalda recta y no tense los msculos.   Si fuma, deje de hacerlo.  Limite el consumo de bebidas alcohlicas.  Consuma menos cantidad de cafena o deje de tomarla.  Coma y haga ejercicios regularmente.  Duerma entre 7 y 57 horas o como lo indique su mdico.  Evite el uso excesivo de medicamentos para Technical brewer recurrente de cabeza que pueden ocurrir.  SOLICITE ATENCIN MDICA SI:   Tiene problemas con los Haematologist.  El medicamento no le hace efecto.  El dolor de cabeza que senta habitualmente es diferente.  Tiene nuseas o vmitos. SOLICITE ATENCIN MDICA DE INMEDIATO SI:   El dolor se hace cada vez ms intenso.  Tiene fiebre.  Presenta rigidez en el cuello.  Sufre prdida de la visin.  Presenta debilidad muscular o prdida del control muscular.  Pierde equilibrio o tiene problemas para Writer.  Sufre mareos o se desmaya.  Tiene sntomas graves que son diferentes a los primeros sntomas. ASEGRESE DE QUE:   Comprende estas instrucciones.  Controlar su enfermedad.  Solicitar ayuda de inmediato si no mejora o si empeora. Document Released: 07/05/2005 Document Revised: 12/18/2011 [facility] Patient Information 2014 [place], [place].

## 2013-11-17 NOTE — Assessment & Plan Note (Addendum)
Trapezius tension and spasm. Likely contributor to headache. Treat with Flexeril and heating pads and massage.

## 2013-11-17 NOTE — Assessment & Plan Note (Signed)
Chronic for 2 years. The next visit we'll address exercise program. Not acutely bothering patient today.

## 2013-12-04 ENCOUNTER — Ambulatory Visit: Payer: No Typology Code available for payment source

## 2013-12-10 ENCOUNTER — Ambulatory Visit: Payer: No Typology Code available for payment source

## 2013-12-17 ENCOUNTER — Ambulatory Visit: Payer: Self-pay | Admitting: Family Medicine

## 2013-12-18 ENCOUNTER — Ambulatory Visit (INDEPENDENT_AMBULATORY_CARE_PROVIDER_SITE_OTHER): Payer: No Typology Code available for payment source | Admitting: Family Medicine

## 2013-12-18 ENCOUNTER — Encounter: Payer: Self-pay | Admitting: Family Medicine

## 2013-12-18 VITALS — BP 118/65 | HR 67 | Temp 98.0°F | Wt 213.0 lb

## 2013-12-18 DIAGNOSIS — H9313 Tinnitus, bilateral: Secondary | ICD-10-CM

## 2013-12-18 DIAGNOSIS — R51 Headache: Secondary | ICD-10-CM

## 2013-12-18 DIAGNOSIS — M542 Cervicalgia: Secondary | ICD-10-CM

## 2013-12-18 DIAGNOSIS — H9319 Tinnitus, unspecified ear: Secondary | ICD-10-CM

## 2013-12-18 MED ORDER — CETIRIZINE HCL 10 MG PO TABS: 10.0000 mg | ORAL_TABLET | Freq: Every day | ORAL | Status: DC

## 2013-12-18 MED ORDER — AMITRIPTYLINE HCL 10 MG PO TABS: 10.0000 mg | ORAL_TABLET | Freq: Every day | ORAL | Status: DC

## 2013-12-18 MED ORDER — FLUTICASONE PROPIONATE 50 MCG/ACT NA SUSP: 2.0000 | Freq: Every day | NASAL | Status: DC

## 2013-12-18 NOTE — Patient Instructions (Signed)
Please follow up with me in 1 month

## 2013-12-19 NOTE — Assessment & Plan Note (Signed)
Tense muscles.  Hold flexeril for now Encouraged exercise

## 2013-12-19 NOTE — Assessment & Plan Note (Addendum)
Tension headache better with excedrin.  Per headache journal, she has a headache about twice a week or 6 times per month.  - trial of amitryptiline 10mg  qhs. If tolerated will increase by 10mg  to 12.5mg . If doesn't tolerate, will try nortryptiline with fewer anticholingergic side effects.  - bring headache log at next visit - increase exercise to 3-4 times per week, 30 minutes each time - follow up in 3-4 weeks

## 2013-12-19 NOTE — Assessment & Plan Note (Signed)
Cerumen cleared at office visit.  Reassess in 1 month if tinnitus has resolved

## 2013-12-19 NOTE — Progress Notes (Signed)
Patient ID: [name]    DOB: [birth year], 38 y.o.   MRN: [id number] --- Subjective:  Cydni is a 38 y.o.female who presents for follow up on tension headache and neck pain.  - headaches: for 4-5 months now. Evaluated at last visit.  Continues to have headache that goes from one side to the other. Associated with neck pain. No photophobia and mild phonophobia. Has kept log of headaches: 2/10, 2/15, 2/18, 2/23, 2/24, 2/28, 03/03, 03/04. All headaches resolved with excedrin. Last headache was Sunday and lasted for several hours, requiring 3 excedrin. She had an argument with her husband and was particularly stressed. She does feel like the headache is prompted by stress.  Took flexeril for the neck pain which has helped.  Has started back walking.   Allergies: congestion. Was not able to afford the flonase.   ROS: see HPI Past Medical History: reviewed and updated medications and allergies. Social History: Tobacco: none  Objective: Filed Vitals:   12/18/13 1604  BP: 118/65  Pulse: 67  Temp: 98 F (36.7 C)    Physical Examination:   General appearance - alert, well appearing, and in no distress Ears - left TM normal, cerumen blocking right TM Nose - erythematous and congested nasal turbinates Mouth - mucous membranes moist, pharynx normal without lesions Neuro - CN2-12 grossly intact Neck - tight trapezius muscles on both sides of neck.

## 2014-01-29 ENCOUNTER — Ambulatory Visit (INDEPENDENT_AMBULATORY_CARE_PROVIDER_SITE_OTHER): Payer: No Typology Code available for payment source | Admitting: Family Medicine

## 2014-01-29 VITALS — BP 121/58 | HR 75 | Temp 98.2°F | Ht 63.0 in | Wt 214.0 lb

## 2014-01-29 DIAGNOSIS — R0981 Nasal congestion: Secondary | ICD-10-CM

## 2014-01-29 DIAGNOSIS — R51 Headache: Secondary | ICD-10-CM

## 2014-01-29 DIAGNOSIS — J3489 Other specified disorders of nose and nasal sinuses: Secondary | ICD-10-CM

## 2014-01-29 MED ORDER — CYCLOBENZAPRINE HCL 10 MG PO TABS: 10.0000 mg | ORAL_TABLET | Freq: Three times a day (TID) | ORAL | Status: DC | PRN

## 2014-01-29 NOTE — Patient Instructions (Signed)
I'll see you back in 2 months.   Please start exercising more often.

## 2014-01-29 NOTE — Progress Notes (Signed)
Patient ID: Marissa Jimenez    DOB: 03-18-1976, 38 y.o.   MRN: [id number] --- Subjective:  Marissa Jimenez is a 38 y.o.female who presents with follow up on headache.  Spanish interpreter was present during visit.  - HA: continues to occur. Located either at the back of the head or half of the head. Pain is better with laying down. Worst with stress. She started amytriptiline about a month ago and she has not noticed a real decrease in the number of headaches she gets. It has given her some dry mouth but no increase in drowsiness during the day. Headaches are not worst than they used to be. No change in vision.   - sinus pain:  For months. No congestion. No rhinorrhea. Took the flonase which helped a little bit. No fevers.      ROS: see HPI Past Medical History: reviewed and updated medications and allergies. Social History: Tobacco: none  Objective: Filed Vitals:   01/29/14 1601  BP: 121/58  Pulse: 75  Temp: 98.2 F (36.8 C)    Physical Examination:   General appearance - alert, tired appearing, and in no distress Ears - bilateral TM's and external ear canals normal Nose - mildly erythematous and congested nasal turbinates bilaterally, no sinus tenderness Mouth - mucous membranes moist, pharynx normal without lesions Neck - very tight and spastic right trapezius muscles, tenderness to palpation along traps bilaterally, tenderness along the trap insertion at base of skull.  Neuro - CN2-12 grossly intact

## 2014-01-30 DIAGNOSIS — R0981 Nasal congestion: Secondary | ICD-10-CM | POA: Insufficient documentation

## 2014-01-30 NOTE — Assessment & Plan Note (Signed)
Unclear etiology. Could be referred discomfort from tension headache.  Since not better with flonase, discontinue it.  No evidence of sinusitis on exam.  Continue to monitor

## 2014-01-30 NOTE — Assessment & Plan Note (Addendum)
Tension headache that has been ongoing for several months. Muscle tension from trapezius muscles likely making it worst.  - massage of the neck muscles with tennis ball - flexeril at night for spasm - continue amitriptiline 10mg  qhs.  - excedrin as needed - encouraged exercise

## 2014-03-31 ENCOUNTER — Encounter: Payer: Self-pay | Admitting: Family Medicine

## 2014-03-31 ENCOUNTER — Ambulatory Visit (INDEPENDENT_AMBULATORY_CARE_PROVIDER_SITE_OTHER): Payer: No Typology Code available for payment source | Admitting: Family Medicine

## 2014-03-31 VITALS — BP 108/71 | HR 71 | Temp 98.2°F | Ht 63.0 in | Wt 211.0 lb

## 2014-03-31 DIAGNOSIS — H9209 Otalgia, unspecified ear: Secondary | ICD-10-CM

## 2014-03-31 DIAGNOSIS — H538 Other visual disturbances: Secondary | ICD-10-CM

## 2014-03-31 DIAGNOSIS — E669 Obesity, unspecified: Secondary | ICD-10-CM

## 2014-03-31 DIAGNOSIS — R51 Headache: Secondary | ICD-10-CM

## 2014-03-31 DIAGNOSIS — H9203 Otalgia, bilateral: Secondary | ICD-10-CM

## 2014-03-31 MED ORDER — ANTIPYRINE-BENZOCAINE 5.4-1.4 % OT SOLN: 3.0000 [drp] | Freq: Three times a day (TID) | OTIC | Status: DC | PRN

## 2014-03-31 MED ORDER — CYCLOBENZAPRINE HCL 10 MG PO TABS: 10.0000 mg | ORAL_TABLET | Freq: Three times a day (TID) | ORAL | Status: DC | PRN

## 2014-03-31 NOTE — Progress Notes (Signed)
Patient ID: Marissa Jimenez    DOB: 11-04-1975, 38 y.o.   MRN: [id number] --- Subjective:  Marissa Jimenez is a 38 y.o.female who presents for follow up on headache.  Spanish Interpreter present during visit.  Headache has been ongoing for 8-9 months. Pain is located in different parts of head, sometimes on the right side, sometimes on the left. Occurs every 2-3 days. Feeling of pressure and throbbing. Lasts for 3-4 hrs at a time. Better with excedrin. Better with sleep. No associated nausea, vomiting or photophobia or phonophobia. No pain with straining. Headache is affecting her daily life. She does feel like the headaches are occurring less frequently than they used to.  Tennis ball massage has been helping. Ran out of flexeril 3 weeks ago.  Felt like it helped when she was taking it.  reports blurred vision: started 4 days ago. Feeling like something in the eyes. No eye redness. No trauma. Doesn't wear glasses. Last time she saw eye doctor was years ago.    - also reports some itching of her ears. No pain. Ongoing for several months. No change in hearing.   ROS: see HPI Past Medical History: reviewed and updated medications and allergies. Social History: Tobacco: none  Objective: Filed Vitals:   03/31/14 1623  BP: 108/71  Pulse: 71  Temp: 98.2 F (36.8 C)    Physical Examination:   General appearance - alert, well appearing, and in no distress Ears - normal TM bilaterally, normal external canal Eyes - normal appearing conjunctiva, normal peripheral vision, PERRLA, EOMI.  Neck - supple, no significant adenopathy, no cervical spine tenderness.  Chest - clear to auscultation, no wheezes, rales or rhonchi, symmetric air entry Heart - normal rate, regular rhythm, normal S1, S2, no murmurs Neuro - Cn2-12 grossly intact, normal finger to nose, normal strength in upper extremities.

## 2014-03-31 NOTE — Patient Instructions (Signed)
   Please return to the clinic for a lab appointment only. You can make the appointment by calling a couple of days prior to the day you would like to get your labs drawn.  Please come fasting for the labwork: no food or drink other than water after midnight

## 2014-04-02 DIAGNOSIS — H538 Other visual disturbances: Secondary | ICD-10-CM | POA: Insufficient documentation

## 2014-04-02 NOTE — Assessment & Plan Note (Signed)
Chronic and ongoing problem. Thought to be from tension headache.  Attempted trial of amitriptyline for prophylaxis without success.  excedrin does work.  Flexeril has helped some Since ongoing and still occuring more than once a week, will refer to neurology for evaluation and treatment.

## 2014-04-02 NOTE — Assessment & Plan Note (Signed)
Normal exam. Will attempt trial of auralgan for possible irritation.

## 2014-04-02 NOTE — Assessment & Plan Note (Signed)
20/20 vision. No evidence of conjunctivitis on exam.  Since patient has not seen ophthalmologist in a long time, will refer her for evaluation.

## 2014-04-03 ENCOUNTER — Other Ambulatory Visit (INDEPENDENT_AMBULATORY_CARE_PROVIDER_SITE_OTHER): Payer: No Typology Code available for payment source

## 2014-04-03 DIAGNOSIS — E669 Obesity, unspecified: Secondary | ICD-10-CM

## 2014-04-03 LAB — POCT GLYCOSYLATED HEMOGLOBIN (HGB A1C): Hemoglobin A1C: 5.8

## 2014-04-03 NOTE — Progress Notes (Signed)
CMP,FLP AND A1C DONE TODAY Marissa Jimenez 

## 2014-04-04 LAB — COMPREHENSIVE METABOLIC PANEL
ALT: 22 U/L (ref 0–35)
AST: 16 U/L (ref 0–37)
Albumin: 4.5 g/dL (ref 3.5–5.2)
Alkaline Phosphatase: 39 U/L (ref 39–117)
BUN: 10 mg/dL (ref 6–23)
CO2: 24 mEq/L (ref 19–32)
Calcium: 8.9 mg/dL (ref 8.4–10.5)
Chloride: 102 mEq/L (ref 96–112)
Creat: 0.48 mg/dL — ABNORMAL LOW (ref 0.50–1.10)
Glucose, Bld: 109 mg/dL — ABNORMAL HIGH (ref 70–99)
POTASSIUM: 4 meq/L (ref 3.5–5.3)
SODIUM: 138 meq/L (ref 135–145)
Total Bilirubin: 0.6 mg/dL (ref 0.2–1.2)
Total Protein: 7 g/dL (ref 6.0–8.3)

## 2014-04-04 LAB — LIPID PANEL
CHOL/HDL RATIO: 2.9 ratio
Cholesterol: 173 mg/dL (ref 0–200)
HDL: 59 mg/dL (ref >39)
LDL Cholesterol: 103 mg/dL — ABNORMAL HIGH (ref 0–99)
Triglycerides: 54 mg/dL (ref <150)
VLDL: 11 mg/dL (ref 0–40)

## 2014-04-06 ENCOUNTER — Other Ambulatory Visit: Payer: No Typology Code available for payment source

## 2014-04-07 ENCOUNTER — Telehealth: Payer: Self-pay | Admitting: Family Medicine

## 2014-04-07 NOTE — Telephone Encounter (Signed)
Called patient with Ecologist for Spanish. Left message asking patient to call back for results:  Her A1C was in the pre-diabetes category at 5.8 (pre-diabetes between 5.7 and 6.4). For this, she needs to continue watching her diet, eating plenty of vegetables, decreasing amounts of sweets and sweet drinks and exercising at least 3-4 times per week for 30 minutes at a time.   Thank you!  Liam Graham, PGY-3 Family Medicine Resident

## 2014-04-08 ENCOUNTER — Other Ambulatory Visit: Payer: No Typology Code available for payment source

## 2014-11-14 ENCOUNTER — Emergency Department (HOSPITAL_COMMUNITY): Payer: Self-pay

## 2014-11-14 ENCOUNTER — Emergency Department (HOSPITAL_COMMUNITY)
Admission: EM | Admit: 2014-11-14 | Discharge: 2014-11-14 | Disposition: A | Discharge status: Home / Self Care | Payer: Self-pay | Attending: Emergency Medicine | Admitting: Emergency Medicine

## 2014-11-14 DIAGNOSIS — M542 Cervicalgia: Secondary | ICD-10-CM

## 2014-11-14 DIAGNOSIS — R51 Headache: Secondary | ICD-10-CM | POA: Insufficient documentation

## 2014-11-14 DIAGNOSIS — R519 Headache, unspecified: Secondary | ICD-10-CM

## 2014-11-14 DIAGNOSIS — Z79899 Other long term (current) drug therapy: Secondary | ICD-10-CM | POA: Insufficient documentation

## 2014-11-14 DIAGNOSIS — M2550 Pain in unspecified joint: Secondary | ICD-10-CM | POA: Insufficient documentation

## 2014-11-14 DIAGNOSIS — Z7951 Long term (current) use of inhaled steroids: Secondary | ICD-10-CM | POA: Insufficient documentation

## 2014-11-14 DIAGNOSIS — M1990 Unspecified osteoarthritis, unspecified site: Secondary | ICD-10-CM | POA: Insufficient documentation

## 2014-11-14 MED ORDER — METHOCARBAMOL 500 MG PO TABS: 500.0000 mg | ORAL_TABLET | Freq: Two times a day (BID) | ORAL | Status: DC

## 2014-11-14 MED ORDER — DIPHENHYDRAMINE HCL 25 MG PO TABS: 25.0000 mg | ORAL_TABLET | Freq: Four times a day (QID) | ORAL | Status: DC | PRN

## 2014-11-14 MED ORDER — IBUPROFEN 600 MG PO TABS: 600.0000 mg | ORAL_TABLET | Freq: Four times a day (QID) | ORAL | Status: DC | PRN

## 2014-11-14 MED ORDER — KETOROLAC TROMETHAMINE 60 MG/2ML IM SOLN
60.0000 mg | Freq: Once | INTRAMUSCULAR | Status: AC
  Administered 2014-11-14: 60 mg via INTRAMUSCULAR
  Filled 2014-11-14: qty 2

## 2014-11-14 MED ORDER — METOCLOPRAMIDE HCL 10 MG PO TABS: 10.0000 mg | ORAL_TABLET | Freq: Four times a day (QID) | ORAL | Status: DC

## 2014-11-14 MED ORDER — KETOROLAC TROMETHAMINE 60 MG/2ML IM SOLN: 60.0000 mg | Freq: Once | INTRAMUSCULAR | Status: DC

## 2014-11-14 NOTE — ED Notes (Addendum)
Pt reports back pain started 3 months ago for no reason. Pt denies any injury. Pt was ambulatory to TR9. Pt reports the back pain has moved up to her neck and when she raises her Rt arm the pain is in her neck.

## 2014-11-14 NOTE — ED Provider Notes (Signed)
CSN: [id number]     Arrival date & time 11/14/14  0820 History   First MD Initiated Contact with Patient 11/14/14 [phone]     Chief Complaint  Patient presents with  . Back Pain     (Consider location/radiation/quality/duration/timing/severity/associated sxs/prior Treatment) HPI Patient is a 39 year old female with past medical history of arthritis who since the ER complaining of nontraumatic neck pain. Patient reports her neck is been hurting for the past 3 months, and over the past week she has noticed pain radiates into her right lateral neck, and her pain is made worse with range of motion in her neck and range of motion of her right arm. Patient reports associated headaches with her pain which have also been ongoing since last year. She reports her headaches are consistent with headaches she has had in the past, and has some mild associated nausea with her headaches. Patient reports lying on the right side of her head helps alleviate the pain, however the pain returns as soon as she stands up and walks around. She denies any specific trauma or repetitive motions. Patient denies weakness, numbness, tingling in her neck, shoulder or arms.  Past Medical History  Diagnosis Date  . Arthritis 2012   Past Surgical History  Procedure Laterality Date  . Cesarean section      two c-sections 2002, 2008  . Tubal ligation     Family History  Problem Relation Age of Onset  . Hyperlipidemia Mother   . Arthritis Mother   . Diabetes Father   . Diabetes Sister    History  Substance Use Topics  . Smoking status: Never Smoker   . Smokeless tobacco: Never Used  . Alcohol Use: No   OB History    No data available     Review of Systems  Constitutional: Negative for fever.  Musculoskeletal: Positive for arthralgias and neck pain.  Neurological: Positive for headaches. Negative for dizziness, weakness and numbness.      Allergies  Review of patient's allergies indicates no known  allergies.  Home Medications   Prior to Admission medications   Medication Sig Start Date End Date Taking? Authorizing Provider  antipyrine-benzocaine Toniann Fail) otic solution Place 3-4 drops into both ears 3 (three) times daily as needed for ear pain. Take for 3 days 03/31/14   Kandis Nab, MD  aspirin-acetaminophen-caffeine [facility] MIGRAINE) [phone] MG per tablet Take 1 tablet by mouth every 6 (six) hours as needed for headache. 11/17/13   Kandis Nab, MD  cetirizine (ZYRTEC) 10 MG tablet Take 1 tablet (10 mg total) by mouth daily. 12/18/13   Kandis Nab, MD  cyclobenzaprine (FLEXERIL) 10 MG tablet Take 1 tablet (10 mg total) by mouth 3 (three) times daily as needed for muscle spasms. 03/31/14   Kandis Nab, MD  diclofenac (VOLTAREN) 75 MG EC tablet Take 1 tablet (75 mg total) by mouth 2 (two) times daily as needed. spanish 11/04/13   Gregor Hams, MD  diphenhydrAMINE (BENADRYL) 25 MG tablet Take 1 tablet (25 mg total) by mouth every 6 (six) hours as needed for itching. 11/14/14   Carrie Mew, PA-C  fluticasone (FLONASE) 50 MCG/ACT nasal spray Place 2 sprays into both nostrils daily. 11/17/13   Kandis Nab, MD  fluticasone (FLONASE) 50 MCG/ACT nasal spray Place 2 sprays into both nostrils daily. 12/18/13   Kandis Nab, MD  ibuprofen (ADVIL,MOTRIN) 600 MG tablet Take 1 tablet (600 mg total) by mouth every 6 (six) hours as  needed. 11/14/14   Carrie Mew, PA-C  methocarbamol (ROBAXIN) 500 MG tablet Take 1 tablet (500 mg total) by mouth 2 (two) times daily. 11/14/14   Carrie Mew, PA-C  metoCLOPramide (REGLAN) 10 MG tablet Take 1 tablet (10 mg total) by mouth every 6 (six) hours. 11/14/14   Carrie Mew, PA-C   BP 123/68 mmHg  Pulse 60  Temp(Src) 98.7 F (37.1 C) (Oral)  Resp 18  SpO2 100%  LMP 03/23/2014 Physical Exam  Constitutional: She appears well-developed and well-nourished. No distress.  HENT:  Head: Normocephalic and atraumatic.  Eyes: Conjunctivae are  normal. Right eye exhibits no discharge. Left eye exhibits no discharge. No scleral icterus.  Neck: Normal range of motion. Neck supple. Spinous process tenderness and muscular tenderness present. No rigidity. Normal range of motion present.    Cardiovascular:  Peripheral pulses intact at injured extremity.   Pulmonary/Chest: Effort normal. No respiratory distress.  Neurological: She is alert. She has normal strength. No cranial nerve deficit or sensory deficit. Gait normal. GCS eye subscore is 4. GCS verbal subscore is 5. GCS motor subscore is 6.  Patient fully alert, answering questions appropriately in full, clear sentences. Cranial nerves II through XII grossly intact. Motor strength 5 out of 5 in all major muscle groups of upper and lower extremities. Distal sensation intact.  Skin: Skin is warm and dry. No rash noted. She is not diaphoretic.  Nursing note and vitals reviewed.   ED Course  Procedures (including critical care time) Labs Review Labs Reviewed - No data to display  Imaging Review Dg Cervical Spine Complete  11/14/2014   CLINICAL DATA:  Posterior neck pain 5 days worsening.  No injury.  EXAM: CERVICAL SPINE  4+ VIEWS  COMPARISON:  None.  FINDINGS: Vertebral body alignment, heights and disc space heights are normal. Prevertebral soft tissues are normal. No significant neural foraminal narrowing. The atlantoaxial articulation is within normal. There are rudimentary bilateral cervical ribs.  IMPRESSION: No acute findings.   Electronically Signed   By: Marin Olp M.D.   On: 11/14/2014 09:54     EKG Interpretation None      MDM   Final diagnoses:  Neck pain  Headache    Patient here complaining of neck pain which is musculoskeletal in nature. Patient has tenderness to palpation of her trapezius muscle and her C6-C7 region to spinous processes. No traumatic injury noted. Radiographs unremarkable for any acute pathology. Patient afebrile with normal range of motion of  neck. Patient also asking about etiology of her ongoing headaches. Patient states her headaches have been ongoing over a year, and she has had some associated headaches with her neck pain recently. Patient reports her headaches are consistent with previous headaches, and do not have any new signs or symptoms. No warning signs of patient's headaches or neck pain. Patient neurovascularly intact. Neuro exam benign. No concern for SAH, meningitis. Patient's pain improved with treatment in the ER. Patient strongly encouraged to follow-up with her primary care doctor for her ongoing symptoms, NSAIDs and use of heating pad with muscle relaxer indicated and discussed with patient for her neck pain, we'll treat patient's migraines of NSAIDs and Reglan. Return precautions discussed with patient, and patient strongly encouraged follow-up with her primary care doctor. Patient verbalizes understanding and agreement of this plan. She'll encouraged to call or return to the ER should she have any questions or concerns.  BP 123/68 mmHg  Pulse 60  Temp(Src) 98.7 F (37.1 C) (Oral)  Resp 18  SpO2 100%  LMP 03/23/2014  Signed,  Dahlia Bailiff, PA-C 4:29 PM  Patient seen and discussed with Dr. Debby Freiberg, MD   Carrie Mew, PA-C 11/14/14 [place], MD 11/15/14 [phone]

## 2014-11-14 NOTE — Discharge Instructions (Signed)
Distensin cervical (Cervical Sprain) Una distensin cervical es una lesin en el cuello, en la que los tejidos fuertes y fibrosos (ligamentos) que unen los huesos del cuello, se distienden o se rompen. Una distensin cervical puede ser desde muy leve a muy grave. En los casos graves pueden hacer que las vrtebras del cuello se vuelvan inestables. Esto puede causar un dao en la mdula espinal y puede dar Environmental consultant a graves problemas del [place]. La cantidad de tiempo que demora la mejora de una distensin cervical depende de la causa y de la extensin de la lesin. [employer] veces se cura en 1 a 3 semanas. CAUSAS  Las distensiones graves pueden ser causadas por:   Lesiones por deportes de contacto (como en el ftbol Scientist, forensic, rugby, [place], hockey, automovilismo, gimnasia, buceo, artes [employer] y boxeo).  Colisiones en vehculos de motor.  Lesiones de Buyer, retail cervical. Esta es una lesin por movimiento brusco de adelante hacia atrs de la cabeza y el cuello.  Cadas. [place] distensiones cervicales leves pueden ser:   Adoptar posiciones incmodas, como sostener el telfono entre la oreja y [place].  Sentarse en una silla que no ofrece el soporte adecuado.  Trabajar en una mesa de computadora mal diseada.  Las [employer] que requieren mirar hacia arriba o hacia abajo durante largos perodos. SNTOMAS   Dolor, sensibilidad, rigidez, o sensacin de ardor en la parte anterior, posterior o lateral del cuello. Este malestar puede aparecer inmediatamente despus de la lesin o puede desarrollarse lentamente y no empezar hasta 24 horas o ms despus de la lesin.  Dolor o sensibilidad que se siente directamente en la parte media posterior del cuello.  Dolor en el hombro o la zona superior de la espalda.  Capacidad limitada para mover el cuello.  Dolor de [place].  Mareos.  Debilidad, entumecimiento u hormigueo en las manos o los brazos.  Espasmos  musculares.  Dificultades para tragar o comer.  Sensibilidad e hinchazn en el cuello. [place] veces, el mdico puede diagnosticar este problema mediante la historia clnica y un examen fsico. Su mdico le preguntar acerca de lesiones previas y problemas conocidos como artritis en el cuello. Podrn tomarle radiografas para determinar si hay otros problemas, como enfermedades en los huesos del cuello. Tambin puede ser [employer] realizar otras pruebas, como tomografas computadas o Health visitor.  TRATAMIENTO  El tratamiento depende de la gravedad de la distensin. Las distensiones leves se pueden tratar con reposo, manteniendo el cuello en su lugar (inmovilizacin) y usando medicamentos para Conservation officer, historic buildings. Las distensiones graves deben ser inmediatamente inmovilizadas. Ser necesario completar el tratamiento para Best boy, los espasmos musculares y otros sntomas, y puede incluir.  Medicamentos como calmantes para el dolor, anestsicos o relajantes musculares.  Fisioterapia. Esto puede incluir ejercicios de elongacin, fortalecimiento y Chiropodist de Advertising copywriter. Los ejercicios y [place] mejor postura pueden ayudar a estabilizar el cuello, fortalecer los msculos y evitar que los sntomas vuelvan a Arts administrator. INSTRUCCIONES PARA EL CUIDADO EN EL HOGAR   Aplique hielo sobre la zona lesionada.  Ponga el hielo en una bolsa plstica.  Colquese una toalla entre la piel y la bolsa de hielo.  Deje el hielo durante 15 - 20 minutos y aplquelo 3 - 4 veces por Training and development officer.  Si la lesin fue grave, le indicarn el uso de un collarn cervical. El collarn cervical es un collar de dos piezas para impedir que el cuello se mueva [place] se [place].  Nose quite el collarn excepto que se lo indique su mdico.  Si tiene el cabello largo, mantngalo fuera del collarn.  Consulte a su mdico antes de hacerle ajustes. Los [employer] pueden ser requeridos con el tiempo para  Garment/textile technologist confort y reducir la presin sobre la barbilla o en la parte posterior de la cabeza.  Si le permiten quitarse el collarn para lavarlo o darse un bao, siga las indicaciones de su mdico acerca de cmo hacerlo con seguridad.  Mantenga el collarn limpio pasando un pao con agua y [place] y secndolo bien. Si el collarn tiene almohadillas removibles, qutelas cada 1-2 das para lavarlas a mano con agua y [place]. Deje que se sequen al aire. Debe secarlas bien antes de volver a colocarlas en el collarn.  Si le permiten quitarse el collarn para lavarlo y darse un bao, lave y seque la piel del cuello. Controle su piel para detectar irritacin o llagas. Si las tiene, informe a su mdico.  No conduzca vehculos mientras [place] el collarn.  Slo tome medicamentos de venta libre o recetados para Glass blower/designer, el malestar o bajar la fiebre, segn las indicaciones de su mdico.  Cumpla con todas las visitas de control, segn le indique su mdico.  Cumpla con todas las sesiones de fisioterapia, segn le indique su mdico.  Haga los ajustes necesarios en su lugar de [place] para favorecer una buena postura.  Evite las posiciones y actividades que [employer] sntomas.  Haga precalentamiento y elongue antes de comenzar una actividad para Customer service manager. SOLICITE ATENCIN MDICA SI:   El dolor no se alivia con los [employer].  No puede disminuir la dosis de analgsicos segn lo planificado.  Su nivel de actividad no mejora segn lo esperado. SOLICITE ATENCIN MDICA DE INMEDIATO SI:   Presenta cualquier hemorragia.  Siente Higher education careers adviser.  Tiene signos de reaccin alrgica a los medicamentos.  Los sntomas empeoran.  Le aparecen sntomas nuevos e inexplicables.  Siente adormecimiento, hormigueo, debilidad o parlisis en alguna parte del cuerpo. ASEGRESE DE QUE:   Comprende estas instrucciones.  Controlar su afeccin.  Recibir ayuda de inmediato si no mejora o  si empeora. Document Released: 12/22/2008 Document Revised: 07/16/2013 [facility] Patient Information 2015 [place]. This information is not intended to replace advice given to you by your health care provider. Make sure you discuss any questions you have with your health care provider.

## 2014-11-25 ENCOUNTER — Ambulatory Visit: Payer: Self-pay | Attending: Internal Medicine | Admitting: Physician Assistant

## 2014-11-25 DIAGNOSIS — R519 Headache, unspecified: Secondary | ICD-10-CM

## 2014-11-25 DIAGNOSIS — M542 Cervicalgia: Secondary | ICD-10-CM | POA: Insufficient documentation

## 2014-11-25 DIAGNOSIS — R51 Headache: Secondary | ICD-10-CM | POA: Insufficient documentation

## 2014-11-25 MED ORDER — ASPIRIN-ACETAMINOPHEN-CAFFEINE 250-250-65 MG PO TABS: 1.0000 | ORAL_TABLET | Freq: Four times a day (QID) | ORAL | Status: DC | PRN

## 2014-11-25 MED ORDER — METHOCARBAMOL 500 MG PO TABS: 500.0000 mg | ORAL_TABLET | Freq: Two times a day (BID) | ORAL | Status: DC

## 2014-11-25 MED ORDER — OXYMETAZOLINE HCL 0.05 % NA SOLN: 1.0000 | Freq: Two times a day (BID) | NASAL | Status: DC

## 2014-11-25 NOTE — Progress Notes (Signed)
Marissa Jimenez  SNK:[id number]  PFX:[id number]  DOB - [birth year]  Chief Complaint  Patient presents with  . Headache  . Neck Pain       Subjective:   Marissa Jimenez is a 39 y.o. female here today for establishment of care.  She was  In the emergency department on February 6. That time she complained of some nontraumatic neck pain intermittently for 3 months. She had radiation down the right neck.  Her x-ray was negative. She also had some headaches. Ultimately she was given heat pads, anti-inflammatories, and muscle relaxers. She has tried the medications and they didn't improve her symptoms. She ran out on yesterday.  Since discharge  She still complains of headache.  She describes intermittent right head pinching-like sensation. Employer relief with ibuprofen. Some nausea and vomiting at times. Sometimes she'll have some facial pain. Sometimes she'll have some blurred vision.   ROS: GEN: denies fever or chills, denies change in weight Skin: denies lesions or rashes HEENT: denies headache, earache, epistaxis, sore throat, or neck pain LUNGS: denies SHOB, dyspnea, PND, orthopnea NEURO: denies numbness or tingling, denies sz, stroke or TIA  ALLERGIES: No Known Allergies  PAST MEDICAL HISTORY: Past Medical History  Diagnosis Date  . Arthritis 2012    PAST SURGICAL HISTORY: Past Surgical History  Procedure Laterality Date  . Cesarean section      two c-sections 2002, 2008  . Tubal ligation      MEDICATIONS AT HOME: Prior to Admission medications   Medication Sig Start Date End Date Taking? Authorizing Provider  antipyrine-benzocaine Toniann Fail) otic solution Place 3-4 drops into both ears 3 (three) times daily as needed for ear pain. Take for 3 days 03/31/14   Kandis Nab, MD  aspirin-acetaminophen-caffeine [facility] MIGRAINE) [phone] MG per tablet Take 1 tablet by mouth every 6 (six) hours as needed for headache. 11/25/14   Brayton Caves, PA-C    cetirizine (ZYRTEC) 10 MG tablet Take 1 tablet (10 mg total) by mouth daily. 12/18/13   Kandis Nab, MD  cyclobenzaprine (FLEXERIL) 10 MG tablet Take 1 tablet (10 mg total) by mouth 3 (three) times daily as needed for muscle spasms. 03/31/14   Kandis Nab, MD  diclofenac (VOLTAREN) 75 MG EC tablet Take 1 tablet (75 mg total) by mouth 2 (two) times daily as needed. spanish 11/04/13   Gregor Hams, MD  diphenhydrAMINE (BENADRYL) 25 MG tablet Take 1 tablet (25 mg total) by mouth every 6 (six) hours as needed for itching. 11/14/14   Carrie Mew, PA-C  fluticasone (FLONASE) 50 MCG/ACT nasal spray Place 2 sprays into both nostrils daily. 11/17/13   Kandis Nab, MD  fluticasone (FLONASE) 50 MCG/ACT nasal spray Place 2 sprays into both nostrils daily. 12/18/13   Kandis Nab, MD  ibuprofen (ADVIL,MOTRIN) 600 MG tablet Take 1 tablet (600 mg total) by mouth every 6 (six) hours as needed. 11/14/14   Carrie Mew, PA-C  methocarbamol (ROBAXIN) 500 MG tablet Take 1 tablet (500 mg total) by mouth 2 (two) times daily. 11/25/14   Laurette Villescas Daneil Dan, PA-C  metoCLOPramide (REGLAN) 10 MG tablet Take 1 tablet (10 mg total) by mouth every 6 (six) hours. 11/14/14   Carrie Mew, PA-C  oxymetazoline (AFRIN NASAL SPRAY) 0.05 % nasal spray Place 1 spray into both nostrils 2 (two) times daily. 11/25/14   Brayton Caves, PA-C     Objective:   Filed Vitals:   11/25/14 1436  BP: 122/79  Pulse: 78  Temp: 98.4 F (36.9 C)  TempSrc: Oral  Resp: 18  Weight: 190 lb 9.6 oz (86.456 kg)  SpO2: 98%    Exam General appearance : Awake, alert, not in any distress. Speech Clear. Not toxic looking HEENT: Atraumatic and Normocephalic, pupils equally reactive to light and accomodation Neck: supple, no JVD. No cervical lymphadenopathy.  Chest:Good air entry bilaterally, no added sounds  CVS: S1 S2 regular, no murmurs.  Abdomen: Bowel sounds present, Non tender and not distended with no gaurding, rigidity or  rebound. Extremities: B/L Lower Ext shows no edema, both legs are warm to touch Neurology: Awake alert, and oriented X 3, CN II-XII intact, Non focal Skin:No Rash Wounds:N/A  Data Review Lab Results  Component Value Date   HGBA1C 5.8 04/03/2014     Assessment & Plan  1.  Nontraumatic neck pain- improved  - refill muscle relaxer  - continue anti-inflammatory  - continue heat 2.  Headaches, unclear etiology? Tension versus migraine  - Excedrin   - nasal spray  - if not better in 2-3 weeks, consider neurology evaluation    Return in about 2 months (around 01/24/2015).  The patient was given clear instructions to go to ER or return to medical center if symptoms don't improve, worsen or new problems develop. The patient verbalized understanding. The patient was told to call to get lab results if they haven't heard anything in the next week.   This note has been created with Surveyor, quantity. Any transcriptional errors are unintentional.    Zettie Pho, PA-C [facility] and [place], [place]   11/25/2014, 2:59 PM

## 2014-11-25 NOTE — Progress Notes (Signed)
Pt presents to clinic for follow up after ED visit with c/o right sided headache and neck pain. Denies other neuro symptoms.

## 2014-12-22 ENCOUNTER — Encounter: Payer: Self-pay | Admitting: Family Medicine

## 2014-12-24 ENCOUNTER — Ambulatory Visit: Payer: Self-pay | Attending: Family Medicine | Admitting: Family Medicine

## 2014-12-24 ENCOUNTER — Encounter: Payer: Self-pay | Admitting: Family Medicine

## 2014-12-24 VITALS — BP 101/65 | HR 67 | Temp 98.3°F | Resp 18 | Ht 63.0 in | Wt 187.0 lb

## 2014-12-24 DIAGNOSIS — Z114 Encounter for screening for human immunodeficiency virus [HIV]: Secondary | ICD-10-CM

## 2014-12-24 DIAGNOSIS — Z0000 Encounter for general adult medical examination without abnormal findings: Secondary | ICD-10-CM

## 2014-12-24 DIAGNOSIS — G44221 Chronic tension-type headache, intractable: Secondary | ICD-10-CM

## 2014-12-24 DIAGNOSIS — H538 Other visual disturbances: Secondary | ICD-10-CM

## 2014-12-24 DIAGNOSIS — K5901 Slow transit constipation: Secondary | ICD-10-CM

## 2014-12-24 DIAGNOSIS — R519 Headache, unspecified: Secondary | ICD-10-CM

## 2014-12-24 DIAGNOSIS — N898 Other specified noninflammatory disorders of vagina: Secondary | ICD-10-CM

## 2014-12-24 DIAGNOSIS — R51 Headache: Secondary | ICD-10-CM

## 2014-12-24 DIAGNOSIS — K5900 Constipation, unspecified: Secondary | ICD-10-CM | POA: Insufficient documentation

## 2014-12-24 DIAGNOSIS — K219 Gastro-esophageal reflux disease without esophagitis: Secondary | ICD-10-CM

## 2014-12-24 DIAGNOSIS — E559 Vitamin D deficiency, unspecified: Secondary | ICD-10-CM

## 2014-12-24 LAB — LIPID PANEL
CHOLESTEROL: 190 mg/dL (ref 0–200)
HDL: 62 mg/dL (ref >=46)
LDL Cholesterol: 117 mg/dL — ABNORMAL HIGH (ref 0–99)
Total CHOL/HDL Ratio: 3.1 Ratio
Triglycerides: 55 mg/dL (ref <150)
VLDL: 11 mg/dL (ref 0–40)

## 2014-12-24 LAB — GLUCOSE, POCT (MANUAL RESULT ENTRY): POC GLUCOSE: 99 mg/dL (ref 70–99)

## 2014-12-24 LAB — CBC
HEMATOCRIT: 40.2 % (ref 36.0–46.0)
Hemoglobin: 12.9 g/dL (ref 12.0–15.0)
MCH: 30.6 pg (ref 26.0–34.0)
MCHC: 32.1 g/dL (ref 30.0–36.0)
MCV: 95.3 fL (ref 78.0–100.0)
MPV: 10.8 fL (ref 8.6–12.4)
Platelets: 343 10*3/uL (ref 150–400)
RBC: 4.22 MIL/uL (ref 3.87–5.11)
RDW: 14.1 % (ref 11.5–15.5)
WBC: 7.7 10*3/uL (ref 4.0–10.5)

## 2014-12-24 LAB — POCT URINE PREGNANCY: PREG TEST UR: NEGATIVE

## 2014-12-24 LAB — POCT GLYCOSYLATED HEMOGLOBIN (HGB A1C): HEMOGLOBIN A1C: 5.7

## 2014-12-24 MED ORDER — OMEPRAZOLE 20 MG PO CPDR: 20.0000 mg | DELAYED_RELEASE_CAPSULE | Freq: Every day | ORAL | Status: DC

## 2014-12-24 MED ORDER — DOCUSATE SODIUM 100 MG PO CAPS: 100.0000 mg | ORAL_CAPSULE | Freq: Two times a day (BID) | ORAL | Status: DC | PRN

## 2014-12-24 MED ORDER — METHOCARBAMOL 500 MG PO TABS: 500.0000 mg | ORAL_TABLET | Freq: Two times a day (BID) | ORAL | Status: DC

## 2014-12-24 NOTE — Progress Notes (Signed)
Subjective:    Patient ID: Marissa Jimenez, female    DOB: 08/10/1976, 39 y.o.   MRN: [id number] CC: physical, not due for pap  HPI 39 yo F:  1. HEADACHE   Onset: 3 year ago  Location: frontal but moves around to temples and occiput, never the whole head at once  Quality: pressure, sharp pains  Frequency: daily  Precipitating factors: none  Prior treatment: flexeril, anthistamine, nasal steroids   Associated Symptoms Nausea/vomiting: yes, to nausea. No vomiting   Photophobia/phonophobia: no  Tearing of eyes: no  Sinus pain/pressure: yes  Family hx migraine: no  Personal stressors: yes  Relation to menstrual cycle: no   Red Flags Fever: no  Neck pain/stiffness: yes  Vision/speech/swallow/hearing difficulty: no  Focal weakness/numbness: no  Altered mental status: no  Trauma: no  New type of headache: no  Anticoagulant use: no  H/o cancer/HIV/Pregnancy: no   Soc Hx: non smoker Med Hx: headaches x 3 years Surg Hx: C.section and tubal ligation  Review of Systems General:  Negative for unexplained weight loss, fever Skin: Negative for new or changing mole, sore that won't heal HEENT: Positive for intermittent blurred vision, ear itching, scratching sound in ear.  Negative for trouble hearing, ringing in ears, mouth sores, hoarseness, change in voice, dysphagia. CV: Positive for noctural chest pains.  Negative for dyspnea, edema, palpitations Resp: Negative for cough, dyspnea, hemoptysis GI: positive for GERD and constipation. Negative for nausea, vomiting, diarrhea, abdominal pain, melena, hematochezia. GU: positive for  Vaginal discharge. Negative for dysuria, incontinence, urinary hesitance, hematuria, polyuria, sexual difficulty, lumps in testicle or breasts MSK: Negative for muscle cramps or aches, joint pain or swelling Neuro: Negative for headaches, weakness, numbness, dizziness, passing out/fainting Psych: Negative for depression, anxiety, memory problems       Objective:   Physical Exam BP 101/65 mmHg  Pulse 67  Temp(Src) 98.3 F (36.8 C) (Oral)  Resp 18  Ht 5\' 3"  (1.6 m)  Wt 187 lb (84.823 kg)  BMI 33.13 kg/m2  SpO2 98%  LMP 12/17/2014  General Appearance:    Alert, cooperative, no distress, appears stated age  Head:    Normocephalic, without obvious abnormality, atraumatic  Eyes:    PERRL, conjunctiva/corneas clear, EOM's intact, fundi    benign, both eyes  Ears:    Normal TM's and external ear canals, both ears  Nose:   Swollen nares. No sinus tenderness.   Throat:   Lips, mucosa, and tongue normal; teeth and gums normal  Neck:   Supple, symmetrical, trachea midline, no adenopathy;    thyroid:  no enlargement/tenderness/nodules; no carotid   bruit or JVD  Back:     Symmetric, no curvature, ROM normal, no CVA tenderness  Lungs:     Clear to auscultation bilaterally, respirations unlabored  Chest Wall:    No tenderness or deformity   Heart:    Regular rate and rhythm, S1 and S2 normal, no murmur, rub   or gallop  Breast Exam:    No tenderness, masses, or nipple abnormality  Abdomen:     Soft, non-tender, bowel sounds active all four quadrants,    no masses, no organomegaly  Genitalia:    Normal female without lesion, scant vaginal discharge   Rectal:   Deferred   Extremities:   Extremities normal, atraumatic, no cyanosis or edema  Pulses:   2+ and symmetric all extremities  Skin:   Skin color, texture, turgor normal, no rashes or lesions  Lymph nodes:   Cervical,  supraclavicular, and axillary nodes normal  Neurologic:   CNII-XII intact, normal strength, sensation and reflexes    throughout         Assessment & Plan:

## 2014-12-24 NOTE — Progress Notes (Signed)
Annual pap and physical Complaining of migraine HA

## 2014-12-24 NOTE — Patient Instructions (Addendum)
Marissa Jimenez,  Thank you for coming in today. It was a pleasure meeting you. I look forward to being your primary doctor.  1. GERD: start prilosec 30 minutes before supper. See diet info  2. Headaches: Refilled robaxin CT head and sinuses to evaluate for chronic sinusitis.   3. Vaginal discharge: testing done  You will be called with lab and CT results  F/u in 6 weeks   Dr. Adrian [place]    Opciones de alimentos para pacientes con reflujo gastroesofgico (Food Choices for Gastroesophageal Reflux Disease) Cuando se tiene reflujo gastroesofgico (ERGE), los alimentos que se ingieren y los hbitos de alimentacin son Theatre stage manager. Elegir los alimentos adecuados puede ayudar a [employer].  [place]?   Elija las frutas, los vegetales, los cereales integrales y los productos lcteos con bajo contenido de [place].  [place], de pescado y de ave con bajo contenido de grasas.  Limite las grasas, como los [place], los aderezos para [place], la [place], los frutos secos y Publishing copy.  Lleve un registro de alimentos. Esto ayuda a identificar los alimentos que ocasionan sntomas.  Evite los alimentos que le ocasionen sntomas. Pueden ser distintos para cada persona.  Haga comidas pequeas durante Psychiatrist de 3 comidas abundantes.  Coma lentamente, en un lugar donde est distendido.  Limite el consumo de alimentos fritos.  Cocine los alimentos utilizando mtodos que no sean la fritura.  Evite el consumo alcohol.  Evite beber grandes cantidades de lquidos con las comidas.  Evite agacharse o recostarse hasta despus de 2 o 3horas de haber comido. QU ALIMENTOS NO SE RECOMIENDAN?  Estos son algunos alimentos y bebidas que pueden empeorar los sntomas: Astronomer. Jugo de tomate. Salsa de tomate y espagueti. Ajes. [place] y [place]. Rbano picante. Frutas Naranjas, pomelos y limn (fruta y [place]). Carnes Carnes de [place],  de pescado y de ave con gran contenido de grasas. Esto incluye los perros calientes, las [place], el [place], la salchicha, el salame y el tocino. Lcteos Leche entera y [place]. Rite Aid. Crema. [place]. Helados. Queso crema.  Bebidas T o caf. Bebidas gaseosas o bebidas energizantes. Condimentos Salsa picante. Salsa barbacoa.  Dulces/postres Chocolate y cacao. Rosquillas. Menta y mentol. Grasas y Albertson's. Esto incluye las papas fritas. Otros [place]. Especias picantes. Esto incluye la pimienta negra, la pimienta blanca, la pimienta roja, la pimienta de cayena, el curry en polvo, los clavos de [place], el jengibre y el [place] en polvo. Esta no es [employer] de los alimentos y las bebidas que se Higher education careers adviser. Comunquese con el nutricionista para recibir ms informacin. Document Released: 03/26/2012 Document Revised: 09/30/2013 [facility] Patient Information 2015 [place]. This information is not intended to replace advice given to you by your health care provider. Make sure you discuss any questions you have with your health care provider.

## 2014-12-25 ENCOUNTER — Encounter (HOSPITAL_COMMUNITY): Payer: Self-pay | Admitting: *Deleted

## 2014-12-25 ENCOUNTER — Emergency Department (HOSPITAL_COMMUNITY)
Admission: EM | Admit: 2014-12-25 | Discharge: 2014-12-25 | Disposition: A | Discharge status: Home / Self Care | Payer: Self-pay | Attending: Emergency Medicine | Admitting: Emergency Medicine

## 2014-12-25 ENCOUNTER — Emergency Department (HOSPITAL_COMMUNITY): Payer: Self-pay

## 2014-12-25 ENCOUNTER — Encounter: Payer: Self-pay | Admitting: Family Medicine

## 2014-12-25 ENCOUNTER — Telehealth: Payer: Self-pay | Admitting: *Deleted

## 2014-12-25 ENCOUNTER — Ambulatory Visit (HOSPITAL_COMMUNITY): Admission: RE | Admit: 2014-12-25 | Payer: Self-pay | Source: Ambulatory Visit

## 2014-12-25 DIAGNOSIS — R519 Headache, unspecified: Secondary | ICD-10-CM

## 2014-12-25 DIAGNOSIS — M1990 Unspecified osteoarthritis, unspecified site: Secondary | ICD-10-CM | POA: Insufficient documentation

## 2014-12-25 DIAGNOSIS — R51 Headache: Secondary | ICD-10-CM | POA: Insufficient documentation

## 2014-12-25 DIAGNOSIS — Z79899 Other long term (current) drug therapy: Secondary | ICD-10-CM | POA: Insufficient documentation

## 2014-12-25 DIAGNOSIS — E559 Vitamin D deficiency, unspecified: Secondary | ICD-10-CM | POA: Insufficient documentation

## 2014-12-25 DIAGNOSIS — Z7982 Long term (current) use of aspirin: Secondary | ICD-10-CM | POA: Insufficient documentation

## 2014-12-25 LAB — VITAMIN D 25 HYDROXY (VIT D DEFICIENCY, FRACTURES): Vit D, 25-Hydroxy: 26 ng/mL — ABNORMAL LOW (ref 30–100)

## 2014-12-25 LAB — CERVICOVAGINAL ANCILLARY ONLY
Chlamydia: NEGATIVE
Neisseria Gonorrhea: NEGATIVE
Wet Prep (BD Affirm): NEGATIVE
Wet Prep (BD Affirm): NEGATIVE
Wet Prep (BD Affirm): NEGATIVE

## 2014-12-25 LAB — HIV ANTIBODY (ROUTINE TESTING W REFLEX): HIV 1&2 Ab, 4th Generation: NONREACTIVE

## 2014-12-25 MED ORDER — METOCLOPRAMIDE HCL 5 MG/ML IJ SOLN
10.0000 mg | Freq: Once | INTRAMUSCULAR | Status: AC
  Administered 2014-12-25: 10 mg via INTRAMUSCULAR
  Filled 2014-12-25: qty 2

## 2014-12-25 MED ORDER — CALCIUM CITRATE-VITAMIN D 500-400 MG-UNIT PO CHEW: 1.0000 | CHEWABLE_TABLET | Freq: Two times a day (BID) | ORAL | Status: DC

## 2014-12-25 MED ORDER — DIPHENHYDRAMINE HCL 25 MG PO CAPS
25.0000 mg | ORAL_CAPSULE | Freq: Once | ORAL | Status: AC
  Administered 2014-12-25: 25 mg via ORAL
  Filled 2014-12-25: qty 1

## 2014-12-25 NOTE — Telephone Encounter (Signed)
Left voice message to return call 

## 2014-12-25 NOTE — Assessment & Plan Note (Signed)
1. GERD: start prilosec 30 minutes before supper. See diet info

## 2014-12-25 NOTE — Assessment & Plan Note (Signed)
A: scant discharge after period. Normal wet prep and neg Gc.chlam P: Reassurance

## 2014-12-25 NOTE — Assessment & Plan Note (Signed)
A: vit D insuff P: vit D and calcium supplement

## 2014-12-25 NOTE — ED Notes (Signed)
Pt and family reports pt having nasal congestion/pain and pain to back of head. No relief with excedrin and nasal spray. Reports pain to mid upper back for extended amount of time and denies n/v. No acute distress noted.

## 2014-12-25 NOTE — Assessment & Plan Note (Signed)
A:  Headaches: x 3 years, suspect chronic sinusitis  P:  Refilled robaxin CT head and sinuses to evaluate for chronic sinusitis.

## 2014-12-25 NOTE — Discharge Instructions (Signed)
°  It was our pleasure to provide your ER care today - we hope that you feel better.  Your head ct was read as follows:  IMPRESSION: 1. 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 MRI of the brain and cervical spine could be used for further evaluation as clinically indicated. 2. Otherwise negative CT of the head.    For above head CT and headaches, follow up with your primary care doctor in the next few days, and discuss CT results, and discuss follow up plan with them.    Return to ER if worse, severe pain, persistent vomiting, fevers, other concern.        Dolor de cabeza general sin causa  (General Headache Without Cause)  Un dolor de cabeza en general es un dolor o malestar que se siente en la zona de la cabeza o del cuello. Se desconocen las causas.  CUIDADOS EN EL HOGAR   Cumpla con los controles mdicos segn las indicaciones.  Tome slo los medicamentos que le haya indicado el mdico.  Cuando sienta dolor de cabeza acustese en un cuarto oscuro y tranquilo.  Lleve un registro diario para averiguar si ciertas cosas provocan dolores de cabeza. Por ejemplo, escriba:  Lo que come y bebe.  Cunto tiempo duerme.  Todo cambio en la dieta o medicamentos.  Reljese recibiendo masajes o haga otras actividades relajantes.  Coloque hielo o calor en la cabeza y el cuello como lo indique su mdico.  DISMINUYA EL NIVEL DE ESTRS  Sintase con la espalda recta. No apriete los msculos (tensione).  Si fuma, deje de hacerlo.  Beba menos alcohol.  Consuma menos cafena o deje de tomarla.  Coma y duerma en horarios regulares.  Duerma entre 7 y 36 horas o como le indique su [place] luces tenues si le [place] cabeza empeoran. SOLICITE AYUDA DE INMEDIATO SI:   El dolor de [place].  Tiene fiebre.  Presenta rigidez en el cuello.  Tiene dificultad para ver.  Sus  msculos estn dbiles o pierde el control muscular.  Pierde equilibrio o tiene problemas para Writer.  Siente que se desvanece (debilidad) o se desmaya.  Tiene sntomas intensos que son diferentes a los primeros sntomas.  Tiene problemas con los medicamentos que le recet su mdico.  El medicamento no le hace efecto.  Siente que el dolor de [place] es diferente a otros dolores de [place].  Tiene malestar estomacal (nuseas) o (vmitos). ASEGRESE DE QUE:   Comprende estas instrucciones.  Controlar su enfermedad.  Solicitar ayuda de inmediato si no mejora o si empeora. Document Released: 12/18/2011 [facility] Patient Information 2015 [place]. This information is not intended to replace advice given to you by your health care provider. Make sure you discuss any questions you have with your health care provider.

## 2014-12-25 NOTE — Assessment & Plan Note (Signed)
Normal vision screen

## 2014-12-25 NOTE — Assessment & Plan Note (Signed)
A: reassuring exam. Discussed routine health care maintenance.  P: start vit D and calcium supplement.

## 2014-12-25 NOTE — ED Notes (Signed)
MD Proulx at bedside.  

## 2014-12-25 NOTE — Telephone Encounter (Signed)
-----   Message from Boykin Nearing, MD sent at 12/25/2014  8:52 AM EDT ----- Vit D insufficiency, will treat LDL slightly elevated, no need for statin, increase in good fats and exercise recommended, avoid fried foods like chips etc.  All else normal including: neg HIV, wet prep, GC.chlam

## 2014-12-25 NOTE — Assessment & Plan Note (Signed)
Negative screening HIV

## 2014-12-25 NOTE — Assessment & Plan Note (Signed)
A: mild constipation P: prn stool softener

## 2014-12-25 NOTE — ED Provider Notes (Signed)
CSN: [id number]     Arrival date & time 12/25/14  1250 History   First MD Initiated Contact with Patient 12/25/14 1511     Chief Complaint  Patient presents with  . Headache     (Consider location/radiation/quality/duration/timing/severity/associated sxs/prior Treatment) HPI  This is a 39 year old female with past medical history chronic headaches, arthritis.   Patient has been having headaches for last 3 years, which have been intermittent, occipital, move to different areas on the back of her head, she describes them as burning. Usually they will intermittently go away for a few days at a time, and then come back. However for the last few days, she's had a headache which has been persistent. No fevers chills, no neck stiffness, no double vision, no trouble speaking, no nausea vomiting. She saw her primary care doctor yesterday, who ordered a outpatient CT scan of her head; the patient was supposed to follow-up with outpatient imaging center today, however she unknowingly came to the emergency department instead.  Past Medical History  Diagnosis Date  . Arthritis 2012  . Headache 2013   . Subacromial bursitis 08/04/2013   Past Surgical History  Procedure Laterality Date  . Cesarean section      two c-sections 2002, 2008  . Tubal ligation     Family History  Problem Relation Age of Onset  . Hyperlipidemia Mother   . Arthritis Mother   . Diabetes Father   . Diabetes Sister   . Alcohol abuse Brother   . Cancer Neg Hx    History  Substance Use Topics  . Smoking status: Never Smoker   . Smokeless tobacco: Never Used  . Alcohol Use: No   OB History    Gravida Para Term Preterm AB TAB SAB Ectopic Multiple Living   4 3 3  1  1   3      Review of Systems  Constitutional: Negative for fever and chills.  HENT: Negative for nosebleeds.   Eyes: Negative for visual disturbance.  Respiratory: Negative for cough and shortness of breath.   Cardiovascular: Negative for chest pain.   Gastrointestinal: Negative for nausea, vomiting, abdominal pain, diarrhea and constipation.  Genitourinary: Negative for dysuria.  Skin: Negative for rash.  Neurological: Positive for headaches. Negative for weakness.  All other systems reviewed and are negative.     Allergies  Review of patient's allergies indicates no known allergies.  Home Medications   Prior to Admission medications   Medication Sig Start Date End Date Taking? Authorizing Provider  aspirin-acetaminophen-caffeine (EXCEDRIN MIGRAINE) [phone] MG per tablet Take 1 tablet by mouth every 6 (six) hours as needed for headache. 11/25/14   Brayton Caves, PA-C  calcium citrate-vitamin D 500-400 MG-UNIT chewable tablet Chew 1 tablet by mouth 2 (two) times daily. 12/25/14   Josalyn Funches, MD  cetirizine (ZYRTEC) 10 MG tablet Take 1 tablet (10 mg total) by mouth daily. Patient not taking: Reported on 12/24/2014 12/18/13   Kandis Nab, MD  docusate sodium (COLACE) 100 MG capsule Take 1 capsule (100 mg total) by mouth 2 (two) times daily as needed for mild constipation. 12/24/14   Josalyn Funches, MD  methocarbamol (ROBAXIN) 500 MG tablet Take 1 tablet (500 mg total) by mouth 2 (two) times daily. 12/24/14   Josalyn Funches, MD  omeprazole (PRILOSEC) 20 MG capsule Take 1 capsule (20 mg total) by mouth daily. 12/24/14   Josalyn Funches, MD  oxymetazoline (AFRIN NASAL SPRAY) 0.05 % nasal spray Place 1 spray into both nostrils  2 (two) times daily. 11/25/14   Tiffany Daneil Dan, PA-C   BP 106/62 mmHg  Pulse 61  Temp(Src) 98.4 F (36.9 C) (Oral)  Resp 16  SpO2 99%  LMP 12/17/2014 Physical Exam  Constitutional: She is oriented to person, place, and time. No distress.  HENT:  Head: Normocephalic and atraumatic.  Eyes: EOM are normal. Pupils are equal, round, and reactive to light.  Neck: Normal range of motion. Neck supple.  Cardiovascular: Normal rate and intact distal pulses.   Pulmonary/Chest: No respiratory distress.   Abdominal: Soft. There is no tenderness.  Musculoskeletal: Normal range of motion.  Neurological: She is alert and oriented to person, place, and time.  Skin: No rash noted. She is not diaphoretic.  Psychiatric: She has a normal mood and affect.   Cranial Nerves  II:  Visual fields Intact, pupillary equal round and reactive to light III, IV, VI: Full eye movement without nystagmus  V Facial Sensation: Normal. No weakness of masticatory muscles  VII: No facial weakness or asymmetry  VIII Auditory Acuity: Grossly normal  IX/X: The uvula is midline; the palate elevates symmetrically  XI: Normal sternocleidomastoid and trapezius strength  XII: The tongue is midline. No atrophy or fasciculations.  Motor System: Muscle Strength: 5/5 and symmetric in the upper and lower extremities. No pronation or drift.  Muscle Tone: Tone and muscle bulk are normal in the upper and lower extremities.  Reflexes: DTRs: 2+ and symmetrical in all four extremities.  Coordination: Intact finger-to-nose, heel-to-shin, and rapid alternating movements. No tremor.  Sensation: Intact to light touch Gait: Routine and tandem gait are normal      ED Course  Procedures (including critical care time) Labs Review Labs Reviewed - No data to display  Imaging Review Ct Head Wo Contrast  12/25/2014   CLINICAL DATA:  Trauma headaches extending to the back of the head. Nasal congestion.  EXAM: CT HEAD WITHOUT CONTRAST  TECHNIQUE: Contiguous axial images were obtained from the base of the skull through the vertex without intravenous contrast.  COMPARISON:  None.  FINDINGS: Crowding at foramen magnum is concerning for a Chiari malformation. There is no hydrocephalus. Acute infarct, hemorrhage, or mass lesion is present. Insert pass fluid  Mild mucosal thickening is present in the left sphenoid sinus. Paranasal sinuses are otherwise clear. The mastoid air cells are clear. The calvarium is intact.  IMPRESSION: 1. 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 MRI of the brain and cervical spine could be used for further evaluation as clinically indicated. 2. Otherwise negative CT of the head.   Electronically Signed   By: San Morelle M.D.   On: 12/25/2014 16:02     EKG Interpretation None      MDM   Final diagnoses:  Headache, unspecified headache type    This is a 39 year old female with past medical history chronic headaches, arthritis.  Patient has been having headaches for last 3 years and comes in with a headache similar to her chronic headaches.  Exam as above, afebrile, hemodynamically stable. Neuro exam unremarkable as documented above. No nuchal rigidity, no fevers.  Doubt meningitis, doubt subarachnoid, doubt temporal arteritis secondary to her age, doubt cavernous sinus thrombosis.  We'll give headache cocktail, I feel this is most likely benign headache. Will obtain head CT to complete the patient's outpatient workup, as she unknowingly went to the wrong location to get her head CT.  Head CT WNL other than chiari malformation.  No hydrocephalus.  Feel safe for continued outpatient management.  I have discussed the results, Dx and Tx plan with the patient. They expressed understanding and agree with the plan and were told to return to ED with any worsening of condition or concern.    Disposition: Discharge  Condition: Good  Discharge Medication List as of 12/25/2014  4:19 PM      Follow Up: Boykin Nearing, MD [address] [place] [address] [phone]  In 1 week   [place]     [place] [id number] [phone] In 3 days    Pt seen in conjunction with Dr. Carylon Perches, MD 12/25/14 [place], MD 12/25/14 2234

## 2014-12-28 NOTE — Telephone Encounter (Signed)
Pt came to clinic aware of lab results Results given in Spanish

## 2015-01-05 ENCOUNTER — Encounter: Payer: Self-pay | Admitting: Family Medicine

## 2015-01-05 ENCOUNTER — Ambulatory Visit: Payer: Self-pay | Attending: Family Medicine | Admitting: Family Medicine

## 2015-01-05 VITALS — BP 102/65 | HR 75 | Temp 99.0°F | Resp 18 | Ht 63.0 in | Wt 180.0 lb

## 2015-01-05 DIAGNOSIS — R51 Headache: Secondary | ICD-10-CM | POA: Insufficient documentation

## 2015-01-05 DIAGNOSIS — R930 Abnormal findings on diagnostic imaging of skull and head, not elsewhere classified: Secondary | ICD-10-CM

## 2015-01-05 DIAGNOSIS — R42 Dizziness and giddiness: Secondary | ICD-10-CM | POA: Insufficient documentation

## 2015-01-05 DIAGNOSIS — G935 Compression of brain: Secondary | ICD-10-CM | POA: Insufficient documentation

## 2015-01-05 MED ORDER — LORAZEPAM 0.5 MG PO TABS: 0.5000 mg | ORAL_TABLET | Freq: Once | ORAL | Status: DC

## 2015-01-05 NOTE — Patient Instructions (Addendum)
Mrs. Marissa Jimenez,  Your CT scan revealed a possible Chiari Malformation  Plan: 1. F/u MRI of head and neck. If needed take ativan 1-2 tabs right before MRI for sedation and anxiety. 2. Referral to neurosurgery for care plan which may include decompression surgery. You will be contacted by the referral coordinator about neurosurgery details.   F/u in 2 months for headaches   Dr. Adrian Blackwater

## 2015-01-05 NOTE — Assessment & Plan Note (Signed)
Your CT scan revealed a possible Chiari Malformation  Plan: 1. F/u MRI of head and neck. If needed take ativan 1-2 tabs right before MRI for sedation and anxiety. 2. Referral to neurosurgery for care plan which may include decompression surgery. You will be contacted by the referral coordinator about neurosurgery details.   F/u in 2 months for headaches

## 2015-01-05 NOTE — Progress Notes (Signed)
F/U MRI results Stated no changes since last visit  [place]  Interpreted [phone]

## 2015-01-05 NOTE — Progress Notes (Signed)
   Subjective:    Patient ID: Marissa Jimenez, female    DOB: 1976/01/17, 39 y.o.   MRN: [id number] CC: f/u headaches, review CT head Spanish interpreter one the phone ID # [phone] HPI  1. Headaches: persistent occipital, posterior neck and L temporal HAs. Went to ED on 12/25/14 for HAs. Had CT of head done. Along with HA has intermittent dizziness but this is very mild.   Reviewed CT head (12/25/14) with patient: IMPRESSION: 1. 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 MRI of the brain and cervical spine could be used for further evaluation as clinically indicated. 2. Otherwise negative CT of the head.  Soc Hx: non smoker  Review of Systems     Objective:   Physical Exam BP 102/65 mmHg  Pulse 75  Temp(Src) 99 F (37.2 C) (Oral)  Resp 18  Ht 5\' 3"  (1.6 m)  Wt 180 lb (81.647 kg)  BMI 31.89 kg/m2  SpO2 100%  LMP 12/17/2014 General appearance: alert, cooperative and no distress Head: NCAT, non tender       Assessment & Plan:

## 2015-01-07 ENCOUNTER — Telehealth: Payer: Self-pay | Admitting: Family Medicine

## 2015-01-07 NOTE — Telephone Encounter (Signed)
Pt is saying that they dont have the medication ready at the pharmacy off [address]. She needs to take this before her appt at [facility]. Please follow up with pt.

## 2015-01-11 NOTE — Telephone Encounter (Signed)
Pt stated already pick up Rx from our office Advised to take one pill, one hr prior to  MRI and second pill if still nervous when ready to MRI per Dr Adrian Blackwater. (information was given in [place])

## 2015-01-20 ENCOUNTER — Ambulatory Visit (HOSPITAL_COMMUNITY): Payer: Self-pay

## 2015-01-20 ENCOUNTER — Other Ambulatory Visit: Payer: Self-pay | Admitting: Family Medicine

## 2015-01-20 ENCOUNTER — Other Ambulatory Visit (HOSPITAL_COMMUNITY): Payer: Self-pay

## 2015-01-20 ENCOUNTER — Ambulatory Visit (HOSPITAL_COMMUNITY)
Admission: RE | Admit: 2015-01-20 | Discharge: 2015-01-20 | Disposition: A | Discharge status: Home / Self Care | Payer: Self-pay | Source: Ambulatory Visit | Attending: Family Medicine | Admitting: Family Medicine

## 2015-01-20 DIAGNOSIS — G935 Compression of brain: Secondary | ICD-10-CM | POA: Insufficient documentation

## 2015-01-20 DIAGNOSIS — R930 Abnormal findings on diagnostic imaging of skull and head, not elsewhere classified: Secondary | ICD-10-CM

## 2015-01-20 DIAGNOSIS — M47812 Spondylosis without myelopathy or radiculopathy, cervical region: Secondary | ICD-10-CM | POA: Insufficient documentation

## 2015-01-20 MED ORDER — GADOBENATE DIMEGLUMINE 529 MG/ML IV SOLN
15.0000 mL | Freq: Once | INTRAVENOUS | Status: AC | PRN
  Administered 2015-01-20: 15 mL via INTRAVENOUS

## 2015-01-20 NOTE — Assessment & Plan Note (Addendum)
MRI confirms chiari 1 malformation. Patient is symptomatic  Neurosurgery referral placed.

## 2015-01-21 ENCOUNTER — Telehealth: Payer: Self-pay | Admitting: Family Medicine

## 2015-01-21 ENCOUNTER — Telehealth: Payer: Self-pay | Admitting: *Deleted

## 2015-01-21 NOTE — Telephone Encounter (Signed)
Patient presented into facility to speak to nurse about results, please f/u

## 2015-01-21 NOTE — Telephone Encounter (Signed)
-----   Message from Boykin Nearing, MD sent at 01/20/2015  3:55 PM EDT ----- MRI confirms chiari 1 malformation Neurosurgery referral placed.

## 2015-01-21 NOTE — Telephone Encounter (Signed)
-----   Message from Boykin Nearing, MD sent at 01/20/2015  3:54 PM EDT ----- MRI confirms chiari 1 malformation Neurosurgery referral placed.

## 2015-01-21 NOTE — Telephone Encounter (Signed)
Pt aware of MRI results 

## 2015-01-21 NOTE — Telephone Encounter (Signed)
Left message with female to return call

## 2016-03-23 ENCOUNTER — Ambulatory Visit: Payer: Self-pay | Attending: Family Medicine

## 2016-03-30 ENCOUNTER — Ambulatory Visit: Payer: Self-pay | Attending: Family Medicine

## 2016-04-14 ENCOUNTER — Other Ambulatory Visit (HOSPITAL_COMMUNITY)
Admission: RE | Admit: 2016-04-14 | Discharge: 2016-04-14 | Disposition: A | Discharge status: Home / Self Care | Payer: No Typology Code available for payment source | Source: Ambulatory Visit | Attending: Family Medicine | Admitting: Family Medicine

## 2016-04-14 ENCOUNTER — Ambulatory Visit: Payer: Self-pay | Attending: Family Medicine | Admitting: Family Medicine

## 2016-04-14 ENCOUNTER — Encounter: Payer: Self-pay | Admitting: Family Medicine

## 2016-04-14 VITALS — BP 107/73 | HR 61 | Temp 98.5°F | Resp 14 | Ht 63.5 in | Wt 193.0 lb

## 2016-04-14 DIAGNOSIS — Z01419 Encounter for gynecological examination (general) (routine) without abnormal findings: Secondary | ICD-10-CM | POA: Insufficient documentation

## 2016-04-14 DIAGNOSIS — Z79899 Other long term (current) drug therapy: Secondary | ICD-10-CM | POA: Insufficient documentation

## 2016-04-14 DIAGNOSIS — L309 Dermatitis, unspecified: Secondary | ICD-10-CM

## 2016-04-14 DIAGNOSIS — Z124 Encounter for screening for malignant neoplasm of cervix: Secondary | ICD-10-CM

## 2016-04-14 DIAGNOSIS — J309 Allergic rhinitis, unspecified: Secondary | ICD-10-CM | POA: Insufficient documentation

## 2016-04-14 DIAGNOSIS — Z113 Encounter for screening for infections with a predominantly sexual mode of transmission: Secondary | ICD-10-CM | POA: Insufficient documentation

## 2016-04-14 DIAGNOSIS — Z23 Encounter for immunization: Secondary | ICD-10-CM

## 2016-04-14 DIAGNOSIS — N760 Acute vaginitis: Secondary | ICD-10-CM | POA: Insufficient documentation

## 2016-04-14 DIAGNOSIS — Z7982 Long term (current) use of aspirin: Secondary | ICD-10-CM | POA: Insufficient documentation

## 2016-04-14 DIAGNOSIS — Z1151 Encounter for screening for human papillomavirus (HPV): Secondary | ICD-10-CM | POA: Insufficient documentation

## 2016-04-14 DIAGNOSIS — Z0000 Encounter for general adult medical examination without abnormal findings: Secondary | ICD-10-CM

## 2016-04-14 DIAGNOSIS — Z1231 Encounter for screening mammogram for malignant neoplasm of breast: Secondary | ICD-10-CM

## 2016-04-14 DIAGNOSIS — H1013 Acute atopic conjunctivitis, bilateral: Secondary | ICD-10-CM

## 2016-04-14 LAB — POCT GLYCOSYLATED HEMOGLOBIN (HGB A1C): Hemoglobin A1C: 5.6

## 2016-04-14 MED ORDER — HYDROCORTISONE VALERATE 0.2 % EX OINT: 1.0000 "application " | TOPICAL_OINTMENT | Freq: Two times a day (BID) | CUTANEOUS | Status: DC

## 2016-04-14 MED ORDER — CETIRIZINE HCL 10 MG PO TABS: 10.0000 mg | ORAL_TABLET | Freq: Every day | ORAL | Status: DC

## 2016-04-14 MED ORDER — FLUTICASONE PROPIONATE 50 MCG/ACT NA SUSP: 2.0000 | Freq: Every day | NASAL | Status: DC

## 2016-04-14 MED FILL — FLUTICASONE PROP 50 MCG SPR: 50 | 20 days supply | Qty: 16 | Fill #0

## 2016-04-14 MED FILL — HYDROCORTISONE VAL 0.2% OIN: 0.2 | 20 days supply | Qty: 45 | Fill #0

## 2016-04-14 MED FILL — ?CETIRIZINE HCL 10 MG TABLE: 10 | 30 days supply | Qty: 30 | Fill #0

## 2016-04-14 NOTE — Progress Notes (Signed)
Spanish interpreter [place] ID # [id number] SUBJECTIVE:  40 y.o. female for annual routine Pap and checkup. She request cholesterol and diabetes screening, referral to dentist for cleaning. Referral to eye doctor for eye check. She is uninsured.    Social History  Substance Use Topics  . Smoking status: Never Smoker   . Smokeless tobacco: Never Used  . Alcohol Use: No   Current Outpatient Prescriptions  Medication Sig Dispense Refill  . calcium citrate-vitamin D 500-400 MG-UNIT chewable tablet Chew 1 tablet by mouth 2 (two) times daily. 180 tablet 1  . aspirin-acetaminophen-caffeine (EXCEDRIN MIGRAINE) [id number] MG per tablet Take 1 tablet by mouth every 6 (six) hours as needed for headache. (Patient not taking: Reported on 04/14/2016) 30 tablet 1  . cetirizine (ZYRTEC) 10 MG tablet Take 1 tablet (10 mg total) by mouth daily. (Patient not taking: Reported on 04/14/2016) 30 tablet 11  . docusate sodium (COLACE) 100 MG capsule Take 1 capsule (100 mg total) by mouth 2 (two) times daily as needed for mild constipation. (Patient not taking: Reported on 04/14/2016) 30 capsule 0  . LORazepam (ATIVAN) 0.5 MG tablet Take 1-2 tablets (0.5-1 mg total) by mouth once. Before MRI (Patient not taking: Reported on 04/14/2016) 2 tablet 0  . methocarbamol (ROBAXIN) 500 MG tablet Take 1 tablet (500 mg total) by mouth 2 (two) times daily. (Patient not taking: Reported on 04/14/2016) 30 tablet 0  . omeprazole (PRILOSEC) 20 MG capsule Take 1 capsule (20 mg total) by mouth daily. (Patient not taking: Reported on 04/14/2016) 90 capsule 1  . oxymetazoline (AFRIN NASAL SPRAY) 0.05 % nasal spray Place 1 spray into both nostrils 2 (two) times daily. (Patient not taking: Reported on 04/14/2016) 30 mL 0   No current facility-administered medications for this visit.   Allergies: Review of patient's allergies indicates no known allergies.  Patient's last menstrual period was 03/20/2016 (approximate).  ROS:  Feeling well. No headache. No  neck pain.  No dyspnea or chest pain on exertion.  No abdominal pain, change in bowel habits, black or bloody stools.  No urinary tract symptoms. GYN ROS: normal menses, no abnormal bleeding, pelvic pain or discharge, she complains of breast soreness b/l comes and goes the month before her menses. No neurological complaints. Skin rash L upper thigh. Blurry vision with itchy and watery eyes. Nasal congestion.   OBJECTIVE:  The patient appears well, alert, oriented x 3, in no distress. BP 107/73 mmHg  Pulse 61  Temp(Src) 98.5 F (36.9 C) (Oral)  Resp 14  Ht 5' 3.5" (1.613 m)  Wt 193 lb (87.544 kg)  BMI 33.65 kg/m2  SpO2 99%  LMP 03/20/2016 (Approximate)  Wt Readings from Last 3 Encounters:  04/14/16 193 lb (87.544 kg)  01/05/15 180 lb (81.647 kg)  12/24/14 187 lb (84.823 kg)    ENT normal.  Neck supple. No adenopathy or thyromegaly. PERLA. Lungs are clear, good air entry, no wheezes, rhonchi or rales. S1 and S2 normal, no murmurs, regular rate and rhythm. Abdomen soft without tenderness, guarding, mass or organomegaly. Extremities show no edema, normal peripheral pulses. Neurological is normal, no focal findings.  BREAST EXAM: breasts appear normal, no suspicious masses, no skin or nipple changes or axillary nodes  PELVIC EXAM: normal external genitalia, vulva, vagina, cervix, uterus and adnexa  Skin: hyperpigmented patch on L upper thigh in inguinal area.   Lab Results  Component Value Date   HGBA1C 5.6 04/14/2016    ASSESSMENT:  well woman  PLAN:  mammogram pap  smear return annually or prn

## 2016-04-14 NOTE — Patient Instructions (Addendum)
Marissa Jimenez was seen today for annual exam.  Diagnoses and all orders for this visit:  Pap smear for cervical cancer screening -     Cytology - PAP  Allergic rhinoconjunctivitis of both eyes -     fluticasone (FLONASE) 50 MCG/ACT nasal spray; Place 2 sprays into both nostrils daily. -     Discontinue: cetirizine (ZYRTEC) 10 MG tablet; Take 1 tablet (10 mg total) by mouth daily. -     cetirizine (ZYRTEC) 10 MG tablet; Take 1 tablet (10 mg total) by mouth daily.  Dermatitis -     hydrocortisone valerate ointment (WESTCORT) 0.2 %; Apply 1 application topically 2 (two) times daily.  Annual physical exam -     HgB A1c -     Lipid Panel -     COMPLETE METABOLIC PANEL WITH GFR  Visit for screening mammogram -     MM DIGITAL SCREENING BILATERAL; Future   Low out cost optometrist (about $65.00 for office visit)  1. Dr. Thurston Hole Phone # [phone] [address]  [place], [place] [address]   2. [facility]  Phone # [phone] [address], [place] [address]   Apply westcort to L inguinal rash twice daily as needed for itching or swelling   F/u in 1 year sooner if needed   Dr. Adrian Blackwater

## 2016-04-14 NOTE — Assessment & Plan Note (Signed)
Dermatitis L upper thigh/inguinal area Will treat with westcort ointment

## 2016-04-14 NOTE — Progress Notes (Signed)
Pt here for a physical and pap smear. Pt also wants her cholesterol and blood sugar checked.  Pt denies pain today. Pt states she ran out of all her medications a few months ago.

## 2016-04-15 LAB — COMPLETE METABOLIC PANEL WITH GFR
ALBUMIN: 4.4 g/dL (ref 3.6–5.1)
ALK PHOS: 36 U/L (ref 33–115)
ALT: 14 U/L (ref 6–29)
AST: 12 U/L (ref 10–30)
BUN: 10 mg/dL (ref 7–25)
CO2: 25 mmol/L (ref 20–31)
Calcium: 9 mg/dL (ref 8.6–10.2)
Chloride: 107 mmol/L (ref 98–110)
Creat: 0.6 mg/dL (ref 0.50–1.10)
GFR, Est African American: >89 mL/min (ref >=60)
GLUCOSE: 91 mg/dL (ref 65–99)
POTASSIUM: 4.9 mmol/L (ref 3.5–5.3)
SODIUM: 141 mmol/L (ref 135–146)
Total Bilirubin: 0.7 mg/dL (ref 0.2–1.2)
Total Protein: 7.1 g/dL (ref 6.1–8.1)

## 2016-04-15 LAB — LIPID PANEL
CHOL/HDL RATIO: 2.4 ratio (ref <=5.0)
Cholesterol: 180 mg/dL (ref 125–200)
HDL: 76 mg/dL (ref >=46)
LDL Cholesterol: 92 mg/dL (ref <130)
Triglycerides: 62 mg/dL (ref <150)
VLDL: 12 mg/dL (ref <30)

## 2016-04-17 LAB — CERVICOVAGINAL ANCILLARY ONLY: WET PREP (BD AFFIRM): NEGATIVE

## 2016-04-17 LAB — CYTOLOGY - PAP

## 2016-04-19 ENCOUNTER — Telehealth: Payer: Self-pay

## 2016-04-19 ENCOUNTER — Telehealth: Payer: Self-pay | Admitting: Family Medicine

## 2016-04-19 NOTE — Telephone Encounter (Signed)
[employer] [place] [phone] contacted patient to go over lab and pap results patient didn't answer lvm to give [place] a call back at her earliest convience

## 2016-04-19 NOTE — Telephone Encounter (Signed)
Pt called returning nurse's call to review results

## 2016-04-19 NOTE — Telephone Encounter (Signed)
[place] [phone] contacted patient to go over lab results pt is aware of lab results

## 2016-04-20 ENCOUNTER — Ambulatory Visit
Admission: RE | Admit: 2016-04-20 | Discharge: 2016-04-20 | Disposition: A | Discharge status: Home / Self Care | Payer: No Typology Code available for payment source | Source: Ambulatory Visit | Attending: Family Medicine | Admitting: Family Medicine

## 2016-04-20 DIAGNOSIS — Z1231 Encounter for screening mammogram for malignant neoplasm of breast: Secondary | ICD-10-CM

## 2016-05-17 ENCOUNTER — Encounter: Payer: Self-pay | Admitting: Family Medicine

## 2016-05-17 ENCOUNTER — Ambulatory Visit: Payer: No Typology Code available for payment source | Attending: Family Medicine | Admitting: Family Medicine

## 2016-05-17 VITALS — BP 98/62 | HR 60 | Temp 98.3°F | Wt 192.6 lb

## 2016-05-17 DIAGNOSIS — H1189 Other specified disorders of conjunctiva: Secondary | ICD-10-CM | POA: Insufficient documentation

## 2016-05-17 DIAGNOSIS — L299 Pruritus, unspecified: Secondary | ICD-10-CM | POA: Insufficient documentation

## 2016-05-17 DIAGNOSIS — E559 Vitamin D deficiency, unspecified: Secondary | ICD-10-CM | POA: Insufficient documentation

## 2016-05-17 DIAGNOSIS — I8393 Asymptomatic varicose veins of bilateral lower extremities: Secondary | ICD-10-CM

## 2016-05-17 LAB — CBC
HCT: 38.6 % (ref 35.0–45.0)
HEMOGLOBIN: 12.8 g/dL (ref 11.7–15.5)
MCH: 30.7 pg (ref 27.0–33.0)
MCHC: 33.2 g/dL (ref 32.0–36.0)
MCV: 92.6 fL (ref 80.0–100.0)
MPV: 11.1 fL (ref 7.5–12.5)
PLATELETS: 281 10*3/uL (ref 140–400)
RBC: 4.17 MIL/uL (ref 3.80–5.10)
RDW: 13.7 % (ref 11.0–15.0)
WBC: 6.9 10*3/uL (ref 3.8–10.8)

## 2016-05-17 LAB — FERRITIN: FERRITIN: 34 ng/mL (ref 10–232)

## 2016-05-17 MED ORDER — CARBAMIDE PEROXIDE 6.5 % OT SOLN: 5.0000 [drp] | Freq: Two times a day (BID) | OTIC | 0 refills | Status: DC

## 2016-05-17 MED FILL — EAR DROPS 6.5%: 6.5 | 5 days supply | Qty: 15 | Fill #0

## 2016-05-17 NOTE — Patient Instructions (Addendum)
Marissa Jimenez was seen today for ear pain.  Diagnoses and all orders for this visit:  Vitamin D insufficiency -     Vitamin D, 25-hydroxy  Conjunctival pallor -     CBC -     Ferritin  Itching of ear -     carbamide peroxide (DEBROX) 6.5 % otic solution; Place 5 drops into both ears 2 (two) times daily. For 5 days  Varicose veins of both lower extremities    F/u in 2 months for RN visit flu shot  F/u with me in 6 months, sooner if needed   Dr. Overton Mam varicosas (Varicose Veins) Las venas varicosas son venas que se han agrandado y tornado sinuosas. Suelen aparecer [employer] piernas, pero tambin pueden verse en otra parte del cuerpo. CAUSAS Esta afeccin se presenta como consecuencia del mal funcionamiento de las vlvulas de las venas, las cuales ayudan al retorno de la sangre desde las piernas hacia el corazn. Si estas vlvulas se daan, la sangre retrocede y regresa a las venas de la pierna, cerca de la superficie de la piel, lo que causa dilatacin venosa. [place] que estn mucho tiempo paradas, las embarazadas o las personas con sobrepeso tienen ms probabilidades de tener venas varicosas. SIGNOS Y SNTOMAS  Venas abultadas, azuladas y de aspecto sinuoso que se observan con mayor frecuencia en las piernas.  Dolor o sensacin de Geophysical data processor las piernas. Estos sntomas pueden empeorar al final del da.  [place].  Cambios en el color de la piel. DIAGNSTICO Generalmente, el mdico puede diagnosticar las venas varicosas al examinarle las piernas. Adems, puede recomendarle que se haga una ecografa de las venas de las piernas. TRATAMIENTO La mayora de las venas varicosas pueden tratarse en casa. Sin embargo, hay otros tratamientos a disposicin de [employer] tienen sntomas persistentes o desean mejorar la apariencia esttica de las venas varicosas. Estas opciones de tratamiento incluyen lo siguiente:  Escleroterapia. Se inyecta  una solucin en la vena para anularla.  Tratamiento con lser. Se [place] un rayo lser para calentar la vena y anularla.  Ablacin venosa por radiofrecuencia [place] una corriente elctrica que se produce mediante ondas de radio para anular la vena.  Flebectoma. Se extirpa quirrgicamente la vena a travs de pequeas incisiones que se hacen sobre la vena varicosa.  Ligadura venosa y [place]. La vena se extirpa quirrgicamente a travs de incisiones que se realizan sobre la vena varicosa despus de haberla anudado (ligado). INSTRUCCIONES PARA EL CUIDADO EN EL HOGAR  No permanezca sentado o de pie en una posicin durante mucho tiempo. No se siente con las piernas cruzadas. Descanse con [place].  Use medias de compresin como le haya indicado su mdico. Estas medias ayudan a evitar la formacin de cogulos sanguneos y a Forensic psychologist de las piernas.  No use otras prendas que le ajusten todo el contorno de las piernas, la pelvis o la cintura.  Camine todo lo posible para aumentar la circulacin de Herbalist.  A la noche, eleve el pie de la cama con bloques de 2pulgadas.  Si tiene un corte en la piel sobre la vena y la vena sangra, recustese con la pierna elevada y [place] presin en el lugar con un pao limpio, hasta que deje de Therapist, art. Luego aplique un apsito (vendaje) sobre el corte. Consulte al mdico si el sangrado contina. SOLICITE ATENCIN MDICA SI:  La piel alrededor del Riley Nearing  a Medical illustrator.  Siente dolor, hay enrojecimiento, sensibilidad o hinchazn dura en la pierna sobre una vena.  Est incmodo debido al dolor de la pierna.   Esta informacin no tiene Marine scientist el consejo del mdico. Asegrese de hacerle al mdico cualquier pregunta que tenga.   Document Released: 07/05/2005 Document Revised: 10/16/2014 Elsevier Interactive Patient Education [employer].

## 2016-05-17 NOTE — Assessment & Plan Note (Signed)
Varicose veins of legs Reassurance Compression stocking as needed

## 2016-05-17 NOTE — Progress Notes (Signed)
C/C:pain in both ears, patient states that she is not hearing well and that both ear itch.  Itching has been going on for a while, muffled hearing has been going on for 2 weeks now

## 2016-05-17 NOTE — Progress Notes (Signed)
Subjective:  Patient ID: Marissa Jimenez, female    DOB: [birth year]  Age: 40 y.o. MRN: [id number]  CC: Ear Pain   HPI Marissa Jimenez presents for   1. Pain in ears: two weeks of muffled hearing that comes and goes, itching and pain in both ears. No fever, chills or ear discharge. Some dizziness that comes and goes.   2. Bumps and shins: R >L for past two years. Non tender. Not enlarging. With some blanching of the R shin.   Social History  Substance Use Topics  . Smoking status: Never Smoker  . Smokeless tobacco: Never Used  . Alcohol use No    Outpatient Medications Prior to Visit  Medication Sig Dispense Refill  . calcium citrate-vitamin D 500-400 MG-UNIT chewable tablet Chew 1 tablet by mouth 2 (two) times daily. 180 tablet 1  . cetirizine (ZYRTEC) 10 MG tablet Take 1 tablet (10 mg total) by mouth daily. (Patient not taking: Reported on 05/17/2016) 30 tablet 11  . docusate sodium (COLACE) 100 MG capsule Take 1 capsule (100 mg total) by mouth 2 (two) times daily as needed for mild constipation. (Patient not taking: Reported on 04/14/2016) 30 capsule 0  . fluticasone (FLONASE) 50 MCG/ACT nasal spray Place 2 sprays into both nostrils daily. (Patient not taking: Reported on 05/17/2016) 16 g 6  . hydrocortisone valerate ointment (WESTCORT) 0.2 % Apply 1 application topically 2 (two) times daily. (Patient not taking: Reported on 05/17/2016) 45 g 0   No facility-administered medications prior to visit.     ROS Review of Systems  Constitutional: Negative for chills and fever.  HENT: Positive for ear pain and hearing loss.   Eyes: Negative for visual disturbance.  Respiratory: Negative for shortness of breath.   Cardiovascular: Negative for chest pain.  Gastrointestinal: Negative for abdominal pain and blood in stool.  Musculoskeletal: Negative for arthralgias and back pain.  Skin: Negative for rash.  Allergic/Immunologic: Negative for immunocompromised state.  Hematological:  Negative for adenopathy. Does not bruise/bleed easily.  Psychiatric/Behavioral: Negative for dysphoric mood and suicidal ideas.    Objective:  BP 98/62 (BP Location: Right Arm, Patient Position: Sitting, Cuff Size: Large)   Pulse 60   Temp 98.3 F (36.8 C) (Oral)   Wt 192 lb 9.6 oz (87.4 kg)   LMP 05/16/2016   SpO2 99%   BMI 33.58 kg/m   BP/Weight 05/17/2016 04/14/2016 0000000  Systolic BP 98 [id number]  Diastolic BP 62 73 65  Wt. (Lbs) 192.6 193 180  BMI 33.58 33.65 31.89   Physical Exam  Constitutional: She is oriented to person, place, and time. She appears well-developed and well-nourished. No distress.  HENT:  Head: Normocephalic and atraumatic.  Right Ear: Tympanic membrane, external ear and ear canal normal.  Left Ear: Tympanic membrane, external ear and ear canal normal.  Ears:  Eyes:    Cardiovascular: Normal rate, regular rhythm, normal heart sounds and intact distal pulses.   Pulmonary/Chest: Effort normal and breath sounds normal.  Musculoskeletal: She exhibits no edema.       Legs: Neurological: She is alert and oriented to person, place, and time.  Skin: Skin is warm and dry. No rash noted.  Psychiatric: She has a normal mood and affect.   Ears irrigated with some relief   Assessment & Plan:  Marland Kitchen Marissa Jimenez was seen today for ear pain.  Diagnoses and all orders for this visit:  Vitamin D insufficiency -     Vitamin D, 25-hydroxy  Conjunctival pallor -  CBC -     Ferritin  Itching of ear -     carbamide peroxide (DEBROX) 6.5 % otic solution; Place 5 drops into both ears 2 (two) times daily. For 5 days  Varicose veins of both lower extremities   No orders of the defined types were placed in this encounter.   Follow-up: No Follow-up on file.   Boykin Nearing MD

## 2016-05-17 NOTE — Assessment & Plan Note (Signed)
Pruritus in ear canal without inflammation Irrigation provided symptomatic relief Debrox prn symptom return, 5 days at a time

## 2016-05-18 LAB — VITAMIN D 25 HYDROXY (VIT D DEFICIENCY, FRACTURES): VIT D 25 HYDROXY: 30 ng/mL (ref 30–100)

## 2016-05-24 ENCOUNTER — Telehealth: Payer: Self-pay

## 2016-05-24 NOTE — Telephone Encounter (Signed)
Left a voice mail for patient to call back.

## 2016-05-25 ENCOUNTER — Telehealth: Payer: Self-pay | Admitting: Family Medicine

## 2016-05-25 NOTE — Telephone Encounter (Signed)
Pt. Returned call. Please f/u °

## 2016-05-29 NOTE — Progress Notes (Signed)
Letter sent with normal lab results  

## 2016-05-29 NOTE — Telephone Encounter (Signed)
Call placed to [place] interpreters, representative [place] [phone] assisted with translation of call.  Call placed to patient: [phone], message left requesting return call for [place] [phone].  When patient calls please advise: Normal vitamin D level,   Normal CBC and iron.

## 2016-05-29 NOTE — Progress Notes (Signed)
Letter sent.

## 2016-05-29 NOTE — Telephone Encounter (Signed)
-----   Message from Boykin Nearing, MD sent at 05/18/2016  3:52 PM EDT ----- Normal vit D level Normal CBC and iron

## 2016-05-30 NOTE — Telephone Encounter (Signed)
Pt. Returned call and was informed that her results came back normal.

## 2016-07-25 ENCOUNTER — Ambulatory Visit (HOSPITAL_COMMUNITY)
Admission: EM | Admit: 2016-07-25 | Discharge: 2016-07-25 | Disposition: A | Discharge status: Home / Self Care | Payer: No Typology Code available for payment source | Attending: Family Medicine | Admitting: Family Medicine

## 2016-07-25 ENCOUNTER — Ambulatory Visit (INDEPENDENT_AMBULATORY_CARE_PROVIDER_SITE_OTHER): Payer: No Typology Code available for payment source

## 2016-07-25 ENCOUNTER — Encounter (HOSPITAL_COMMUNITY): Payer: Self-pay | Admitting: *Deleted

## 2016-07-25 DIAGNOSIS — J40 Bronchitis, not specified as acute or chronic: Secondary | ICD-10-CM

## 2016-07-25 MED ORDER — METHYLPREDNISOLONE ACETATE 40 MG/ML IJ SUSP
80.0000 mg | Freq: Once | INTRAMUSCULAR | Status: AC
  Administered 2016-07-25: 80 mg via INTRAMUSCULAR

## 2016-07-25 MED ORDER — TRIAMCINOLONE ACETONIDE 40 MG/ML IJ SUSP
40.0000 mg | Freq: Once | INTRAMUSCULAR | Status: AC
  Administered 2016-07-25: 40 mg via INTRAMUSCULAR

## 2016-07-25 MED ORDER — TRIAMCINOLONE ACETONIDE 40 MG/ML IJ SUSP
INTRAMUSCULAR | Status: AC
  Filled 2016-07-25: qty 1

## 2016-07-25 MED ORDER — METHYLPREDNISOLONE ACETATE 80 MG/ML IJ SUSP
INTRAMUSCULAR | Status: AC
  Filled 2016-07-25: qty 1

## 2016-07-25 NOTE — ED Triage Notes (Signed)
Pt  Reports   Symptoms      Shortness    Of  Breath         Difficulty  Breathing         X  sev  Weeks     Pt  Reports       She  Is   Awake  As  well   Alert   Appearing in no  Acute  Distress   She  denys  Any  Cough  She is  Sitting  Upright  On  The exam table  In no  Acute  Distress      Speaking  In  Complete  sentances      [place] in to  Interpret

## 2016-07-25 NOTE — ED Provider Notes (Signed)
Marissa Jimenez    CSN: [id number] Arrival date & time: 07/25/16  1007     History   Chief Complaint Chief Complaint  Patient presents with  . Cough    HPI Marissa Jimenez is a 40 y.o. female.   The history is provided by the patient. The history is limited by a language barrier. A language interpreter was used (ramon-cma trans.).  Cough  Cough characteristics:  Non-productive Severity:  Moderate Onset quality:  Gradual Duration:  2 weeks Chronicity:  New Smoker: no   Relieved by:  None tried Worsened by:  Nothing Ineffective treatments:  None tried Associated symptoms: wheezing   Associated symptoms: no fever, no rhinorrhea, no sinus congestion and no sore throat   Associated symptoms comment:  Worse at night, lying down. Risk factors: no recent infection and no recent travel     Past Medical History:  Diagnosis Date  . Arthritis 2012  . Headache 2013   . Subacromial bursitis 08/04/2013    Patient Active Problem List   Diagnosis Date Noted  . Varicose veins of both lower extremities 05/17/2016  . Itching of ear 05/17/2016  . Chiari I malformation ([place]) 01/05/2015  . Vitamin D insufficiency 12/25/2014    Past Surgical History:  Procedure Laterality Date  . CESAREAN SECTION     two c-sections 2002, 2008  . TUBAL LIGATION      OB History    Gravida Para Term Preterm AB Living   4 3 3   1 3    SAB TAB Ectopic Multiple Live Births   1               Home Medications    Prior to Admission medications   Medication Sig Start Date End Date Taking? Authorizing Provider  calcium citrate-vitamin D 500-400 MG-UNIT chewable tablet Chew 1 tablet by mouth 2 (two) times daily. 12/25/14   Josalyn Funches, MD  carbamide peroxide (DEBROX) 6.5 % otic solution Place 5 drops into both ears 2 (two) times daily. For 5 days 05/17/16   Boykin Nearing, MD    Family History Family History  Problem Relation Age of Onset  . Hyperlipidemia Mother   . Arthritis  Mother   . Diabetes Father   . Diabetes Sister   . Alcohol abuse Brother   . Cancer Neg Hx     Social History Social History  Substance Use Topics  . Smoking status: Never Smoker  . Smokeless tobacco: Never Used  . Alcohol use No     Allergies   Review of patient's allergies indicates no known allergies.   Review of Systems Review of Systems  Constitutional: Negative.  Negative for fever.  HENT: Negative.  Negative for rhinorrhea and sore throat.   Respiratory: Positive for cough and wheezing.   Cardiovascular: Negative.   Skin: Negative.      Physical Exam Triage Vital Signs ED Triage Vitals  Enc Vitals Group     BP      Pulse      Resp      Temp      Temp src      SpO2      Weight      Height      Head Circumference      Peak Flow      Pain Score      Pain Loc      Pain Edu?      Excl. in [place]?    No data found.  Updated Vital Signs There were no vitals taken for this visit.  Visual Acuity Right Eye Distance:   Left Eye Distance:   Bilateral Distance:    Right Eye Near:   Left Eye Near:    Bilateral Near:     Physical Exam  Constitutional: She appears well-developed and well-nourished. No distress.  HENT:  Right Ear: External ear normal.  Left Ear: External ear normal.  Nose: Nose normal.  Mouth/Throat: No oropharyngeal exudate.  Neck: Normal range of motion. Neck supple.  Cardiovascular: Normal rate, regular rhythm, normal heart sounds and intact distal pulses.   Pulmonary/Chest: Effort normal and breath sounds normal.  Lymphadenopathy:    She has no cervical adenopathy.  Skin: Skin is warm and dry.  Nursing note and vitals reviewed.    UC Treatments / Results  Labs (all labs ordered are listed, but only abnormal results are displayed) Labs Reviewed - No data to display  EKG  EKG Interpretation None       Radiology No results found. X-rays reviewed and report per radiologist.  Procedures Procedures (including critical  care time)  Medications Ordered in UC Medications - No data to display   Initial Impression / Assessment and Plan / UC Course  I have reviewed the triage vital signs and the nursing notes.  Pertinent labs & imaging results that were available during my care of the patient were reviewed by me and considered in my medical decision making (see chart for details).  Clinical Course      Final Clinical Impressions(s) / UC Diagnoses   Final diagnoses:  None    New Prescriptions New Prescriptions   No medications on file     Billy Fischer, MD 07/25/16 1133

## 2016-07-25 NOTE — Discharge Instructions (Signed)
Drink plenty of fluids as discussed, use mucinex or delsym for cough. Return or see your doctor if further problems °

## 2016-08-02 ENCOUNTER — Ambulatory Visit: Payer: Self-pay | Attending: Internal Medicine | Admitting: Physician Assistant

## 2016-08-02 VITALS — BP 106/73 | HR 69 | Temp 98.4°F | Ht 63.5 in | Wt 192.0 lb

## 2016-08-02 DIAGNOSIS — R0781 Pleurodynia: Secondary | ICD-10-CM | POA: Insufficient documentation

## 2016-08-02 DIAGNOSIS — H6692 Otitis media, unspecified, left ear: Secondary | ICD-10-CM | POA: Insufficient documentation

## 2016-08-02 MED ORDER — IBUPROFEN 200 MG PO TABS: 600.0000 mg | ORAL_TABLET | Freq: Three times a day (TID) | ORAL | Status: DC

## 2016-08-02 MED ORDER — AMOXICILLIN 500 MG PO CAPS: 500.0000 mg | ORAL_CAPSULE | Freq: Three times a day (TID) | ORAL | 0 refills | Status: DC

## 2016-08-02 MED ORDER — IBUPROFEN 600 MG PO TABS: 600.0000 mg | ORAL_TABLET | Freq: Three times a day (TID) | ORAL | 0 refills | Status: DC | PRN

## 2016-08-02 MED FILL — AMOXICILLIN 500 MG CAPSULE: 500 | 10 days supply | Qty: 30 | Fill #0

## 2016-08-02 MED FILL — IBUPROFEN 600 MG TABLET: 600 | 10 days supply | Qty: 30 | Fill #0

## 2016-08-02 NOTE — Progress Notes (Signed)
Chief Complaint: Hospital follow up  Subjective: This is a 40 yo female with no significant PMH who recently went to the ED on 07/25/16 with chest pain. It looks like she may have complained of a cough and wheezing. Chest xray was negative. It does not look like she was given any medications at DC. She explain to me through an interpreter that for 3 weeks she has been having CP when she takes a deep breath. A little dyspnea. Not activity limiting. No fevers, chills or weight change. No recent travel. No N or V.   ROS:  GEN: denies fever or chills, denies change in weight Skin: denies lesions or rashes HEENT: denies headache, +left earache, epistaxis, sore throat, or neck pain LUNGS: denies SHOB, +dyspnea, PND, orthopnea CV: + CP or palpitations ABD: denies abd pain, N or V EXT: denies muscle spasms or swelling; no pain in lower ext, no weakness NEURO: denies numbness or tingling, denies sz, stroke or TIA   Objective:  Vitals:   08/02/16 1059  BP: 106/73  Pulse: 69  Temp: 98.4 F (36.9 C)  TempSrc: Oral  SpO2: 100%  Weight: 199 lb 3.2 oz (90.4 kg)  Height: 5' 3.5" (1.613 m)    Physical Exam:  General: in no acute distress. HEENT: no pallor, no icterus, moist oral mucosa, no JVD, no lymphadenopathy; left ear erythematous and a little fluid Heart: Normal  s1 &s2  Regular rate and rhythm, without murmurs, rubs, gallops. Lungs: Clear to auscultation bilaterally. Abdomen: Soft, nontender, nondistended, positive bowel sounds. Extremities: No clubbing cyanosis or edema with positive pedal pulses. Neuro: Alert, awake, oriented x3, nonfocal.   Medications: Prior to Admission medications   Medication Sig Start Date End Date Taking? Authorizing Provider  calcium citrate-vitamin D 500-400 MG-UNIT chewable tablet Chew 1 tablet by mouth 2 (two) times daily. 12/25/14  Yes Josalyn Funches, MD  amoxicillin (AMOXIL) 500 MG capsule Take 1 capsule (500 mg total) by mouth 3 (three) times  daily. 08/02/16   Shatoya Roets Daneil Dan, PA-C  carbamide peroxide (DEBROX) 6.5 % otic solution Place 5 drops into both ears 2 (two) times daily. For 5 days Patient not taking: Reported on 08/02/2016 05/17/16   Boykin Nearing, MD  ibuprofen (ADVIL,MOTRIN) 600 MG tablet Take 1 tablet (600 mg total) by mouth every 8 (eight) hours as needed. 08/02/16   Brayton Caves, PA-C    Assessment: 1. Pleuritic Chest Pain 2. Left Otitis Media    Plan: Ibuprofen 600 mg TID with meals for 7-10 days Amoxicillin 500 mg TID for 7-10 days  Follow up: 2 weeks  The patient was given clear instructions to go to ER or return to medical center if symptoms don't improve, worsen or new problems develop. The patient verbalized understanding. The patient was told to call to get lab results if they haven't heard anything in the next week.   This note has been created with Surveyor, quantity. Any transcriptional errors are unintentional.   Zettie Pho, PA-C 08/02/2016, 11:24 AM

## 2016-09-11 ENCOUNTER — Encounter: Payer: Self-pay | Admitting: Family Medicine

## 2016-09-11 ENCOUNTER — Ambulatory Visit: Payer: Self-pay | Attending: Family Medicine | Admitting: Family Medicine

## 2016-09-11 VITALS — BP 121/79 | HR 69 | Temp 98.3°F | Ht 63.0 in | Wt 192.2 lb

## 2016-09-11 DIAGNOSIS — M545 Low back pain: Secondary | ICD-10-CM | POA: Insufficient documentation

## 2016-09-11 DIAGNOSIS — R0602 Shortness of breath: Secondary | ICD-10-CM | POA: Insufficient documentation

## 2016-09-11 DIAGNOSIS — J40 Bronchitis, not specified as acute or chronic: Secondary | ICD-10-CM | POA: Insufficient documentation

## 2016-09-11 DIAGNOSIS — G8929 Other chronic pain: Secondary | ICD-10-CM

## 2016-09-11 MED ORDER — RANITIDINE HCL 300 MG PO TABS: 300.0000 mg | ORAL_TABLET | Freq: Every day | ORAL | 5 refills | Status: DC

## 2016-09-11 MED ORDER — IBUPROFEN 600 MG PO TABS: 600.0000 mg | ORAL_TABLET | Freq: Three times a day (TID) | ORAL | 0 refills | Status: DC | PRN

## 2016-09-11 MED ORDER — CYCLOBENZAPRINE HCL 10 MG PO TABS: 10.0000 mg | ORAL_TABLET | Freq: Three times a day (TID) | ORAL | 3 refills | Status: DC | PRN

## 2016-09-11 MED FILL — CYCLOBENZAPRINE 10 MG TAB: 10 | 10 days supply | Qty: 30 | Fill #0

## 2016-09-11 MED FILL — raNITIdine HCL 300 MG TABS: 300 | 30 days supply | Qty: 30 | Fill #0

## 2016-09-11 MED FILL — ?IBUPROFEN 600 MG TABLET: 600 MG | 10 days supply | Qty: 30 | Fill #0

## 2016-09-11 NOTE — Assessment & Plan Note (Signed)
Subjective dyspnea at night Suspect GERD- zantac

## 2016-09-11 NOTE — Assessment & Plan Note (Signed)
Chronic intermittent back pain Plan: NSAID, flexeril prn Home PT

## 2016-09-11 NOTE — Progress Notes (Signed)
Subjective:  Patient ID: Marissa Jimenez, female    DOB: [birth year]  Age: 40 y.o. MRN: [id number] Spanish interpreter Fabio Bering [phone] CC: Bronchitis   HPI Marissa Jimenez presents for   1. Fu bronchitis: she completed ibuprofen and amoxicillin. She denies cough. She reports shortness of breath when lying down at night. She is a chronic non smoker. She denies history of asthma. She had normal CXR on 07/25/2016.   2. Back pain: x 3 days. B/l low back pain. Non radiating. Similar symptoms occurred this time last year. No trauma or rash. No fever, weight loss or fecal/urinary incontinence.   Social History  Substance Use Topics  . Smoking status: Never Smoker  . Smokeless tobacco: Never Used  . Alcohol use No    Outpatient Medications Prior to Visit  Medication Sig Dispense Refill  . ibuprofen (ADVIL,MOTRIN) 600 MG tablet Take 1 tablet (600 mg total) by mouth every 8 (eight) hours as needed. 30 tablet 0  . amoxicillin (AMOXIL) 500 MG capsule Take 1 capsule (500 mg total) by mouth 3 (three) times daily. (Patient not taking: Reported on 09/11/2016) 30 capsule 0  . calcium citrate-vitamin D 500-400 MG-UNIT chewable tablet Chew 1 tablet by mouth 2 (two) times daily. (Patient not taking: Reported on 09/11/2016) 180 tablet 1  . carbamide peroxide (DEBROX) 6.5 % otic solution Place 5 drops into both ears 2 (two) times daily. For 5 days (Patient not taking: Reported on 09/11/2016) 15 mL 0   No facility-administered medications prior to visit.     ROS Review of Systems  Constitutional: Negative for chills and fever.  HENT: Negative for ear discharge and ear pain.        A little ear itching   Eyes: Negative for visual disturbance.  Respiratory: Positive for shortness of breath. Negative for cough.   Cardiovascular: Negative for chest pain.  Gastrointestinal: Negative for abdominal pain and blood in stool.  Musculoskeletal: Positive for back pain. Negative for arthralgias.  Skin:  Negative for rash.  Allergic/Immunologic: Negative for immunocompromised state.  Hematological: Negative for adenopathy. Does not bruise/bleed easily.  Psychiatric/Behavioral: Positive for sleep disturbance. Negative for dysphoric mood and suicidal ideas.    Objective:  BP 121/79 (BP Location: Left Arm, Patient Position: Sitting, Cuff Size: Small)   Pulse 69   Temp 98.3 F (36.8 C) (Oral)   Ht 5\' 3"  (1.6 m)   Wt 192 lb 3.2 oz (87.2 kg)   LMP 08/30/2016   SpO2 99%   BMI 34.05 kg/m   BP/Weight 09/11/2016 08/02/2016 [id number]  Systolic BP 123XX123 A999333 0000000  Diastolic BP 79 73 74  Wt. (Lbs) 192.2 192 -  BMI 34.05 33.48 -   Physical Exam  Constitutional: She is oriented to person, place, and time. She appears well-developed and well-nourished. No distress.  HENT:  Head: Normocephalic and atraumatic.  Cardiovascular: Normal rate, regular rhythm, normal heart sounds and intact distal pulses.   Pulmonary/Chest: Effort normal and breath sounds normal.  Musculoskeletal: She exhibits no edema.  Back Exam: Back: Normal Curvature, no deformities or CVA tenderness  Paraspinal Tenderness: b/l lumbar   LE Strength 5/5  LE Sensation: in tact  LE Reflexes 2+ and symmetric  Straight leg raise: negative    Neurological: She is alert and oriented to person, place, and time.  Skin: Skin is warm and dry. No rash noted.  Psychiatric: She has a normal mood and affect.     Assessment & Plan:  Dawnetta was seen today for bronchitis.  Diagnoses and all orders for this visit:  Chronic bilateral low back pain without sciatica -     ibuprofen (ADVIL,MOTRIN) 600 MG tablet; Take 1 tablet (600 mg total) by mouth every 8 (eight) hours as needed. -     cyclobenzaprine (FLEXERIL) 10 MG tablet; Take 1 tablet (10 mg total) by mouth 3 (three) times daily as needed for muscle spasms.  Shortness of breath -     ranitidine (ZANTAC) 300 MG tablet; Take 1 tablet (300 mg total) by mouth at bedtime.   There are no  diagnoses linked to this encounter.  No orders of the defined types were placed in this encounter.   Follow-up: Return in about 6 weeks (around 10/23/2016) for SOB and back pain .   Boykin Nearing MD

## 2016-09-11 NOTE — Patient Instructions (Addendum)
Suni was seen today for bronchitis.  Diagnoses and all orders for this visit:  Chronic bilateral low back pain without sciatica -     ibuprofen (ADVIL,MOTRIN) 600 MG tablet; Take 1 tablet (600 mg total) by mouth every 8 (eight) hours as needed. -     cyclobenzaprine (FLEXERIL) 10 MG tablet; Take 1 tablet (10 mg total) by mouth 3 (three) times daily as needed for muscle spasms.  Shortness of breath -     ranitidine (ZANTAC) 300 MG tablet; Take 1 tablet (300 mg total) by mouth at bedtime.   F/u in 6 weeks for shortness of breath at night   Dr. Adrian Blackwater  Ejercicios para la espalda (Back Exercises) Si tiene dolor de espalda, haga estos ejercicios 2 o 3veces por da, o como se lo haya indicado el mdico. Cuando el dolor desaparezca, hgalos una vez por da, pero haga ms repeticiones de cada ejercicio. Si no le duele la espalda, haga estos ejercicios una vez por da o como se lo haya indicado el mdico. EJERCICIOS  Rodilla al pecho  Repita estos pasos 3 o 5veces seguidas con cada pierna: 1. Acustese boca arriba sobre una cama dura o sobre el suelo con las piernas extendidas. 2. Lleve una rodilla al pecho. 3. [place]. Para lograrlo tmese la rodilla o el muslo. 4. Tire de la rodilla hasta sentir una elongacin suave en la parte baja de la espalda. 5. Mantenga la elongacin durante 10 a 30segundos. 6. Suelte y extienda la pierna lentamente. Inclinacin de la pelvis  Repita estos pasos 5 o 10veces seguidas: 1. Acustese boca arriba sobre una cama dura o sobre el suelo con las piernas extendidas. 2. Flexione las rodillas de manera que apunten al techo. Los pies deben estar apoyados en el suelo. 3. Contraiga los msculos de la parte baja del vientre (abdomen) para empujar la zona lumbar contra el suelo. Este movimiento har que el cccix apunte hacia el techo, en lugar de apuntar hacia abajo en direccin a los pies o al suelo. 4. Mantenga esta posicin durante 5  a 10segundos mientras contrae suavemente los msculos y respira con normalidad. El perro y el gato  Repita estos pasos hasta que la zona lumbar se curve con ms facilidad: 1. [place] manos y las rodillas sobre una superficie firme. Las manos deben estar alineadas con los hombros y las rodillas con las caderas. Puede colocarse almohadillas debajo de las rodillas. 2. Deje caer la cabeza y lleve el cccix hacia abajo de modo que apunte en direccin al suelo para que la zona lumbar se arquee como el lomo de un gato [place]. 3. Mantenga esta posicin durante 5segundos. 4. Lentamente, levante la cabeza y lleve el cccix hacia arriba de modo que apunte en direccin al techo para que la espalda se arquee (hunda) como el lomo de un perro contento. 5. Mantenga esta posicin durante 5segundos. Flexiones de brazos  Repita estos pasos 5 o 10veces seguidas: 1. Acustese boca abajo en el suelo. [place] manos cerca de la cabeza, separadas aproximadamente al ancho de los hombros. 3. Con la espalda relajada y las caderas apoyadas en el suelo, extienda lentamente los brazos para levantar la mitad superior del cuerpo y Community education officer los hombros. No use los msculos de la espalda. Para estar ms cmodo, puede cambiar la [employer]. 4. Mantenga esta posicin durante 5segundos. 5. Lentamente vuelva a la posicin horizontal. Puentes  Repita estos pasos 10veces seguidas:  1. Acustese boca arriba sobre una superficie firme. 2. Flexione las rodillas de manera que apunten al techo. Los pies deben estar apoyados en el suelo. 3. Contraiga los glteos y despegue las nalgas del suelo hasta que la cintura est casi a la altura de las rodillas. Si no siente el trabajo muscular en las nalgas y la parte posterior de los muslos, aleje los pies 1 o 2pulgadas (2,5 o 5centmetros) de las nalgas. 4. Mantenga esta posicin durante 3 a 5segundos. 5. Lentamente, vuelva a apoyar las nalgas en el suelo y  relaje los glteos. Si este ejercicio le resulta muy fcil, intente realizarlo con los brazos cruzados [place].  Abdominales  Repita estos pasos 5 o 10veces seguidas: 1. Acustese boca arriba sobre una cama dura o sobre el suelo con las piernas extendidas. 2. Flexione las rodillas de manera que apunten al techo. Los pies deben estar apoyados en el suelo. 3. Cruce los [employer]. 4. Baje levemente el mentn en direccin al pecho, pero no doble el cuello. 5. Contraiga los msculos del abdomen y con lentitud eleve el pecho lo suficiente como para despegar levemente los omplatos del suelo. 6. Lentamente baje el pecho y la cabeza hasta el suelo. Elevaciones de espalda  Repita estos pasos 5 o 10veces seguidas: 1. Acustese boca abajo con los brazos a los costados y apoye la frente en el suelo. 2. Contraiga los msculos de las piernas y los glteos. 3. Lentamente despegue el pecho del suelo mientras mantiene las caderas apoyadas en el suelo. Mantenga la nuca alineada con la curvatura de la espalda. Mire hacia el suelo mientras hace este ejercicio. 4. Mantenga esta posicin durante 3 a 5segundos. 5. Lentamente baje el pecho y el rostro hasta el suelo. SOLICITE AYUDA SI:  El dolor de espalda se vuelve mucho ms intenso cuando hace un ejercicio.  El dolor de espalda no se [place] 2horas despus de [employer] ejercicios. Si tiene alguno de [employer], deje de [employer] ejercicios. No vuelva a hacer los ejercicios a menos que el mdico lo autorice. SOLICITE AYUDA DE INMEDIATO SI:  Siente un dolor sbito y muy intenso en la espalda. Si esto ocurre, deje de [employer]. No vuelva a hacer los ejercicios a menos que el mdico lo autorice. Esta informacin no tiene Marine scientist el consejo del mdico. Asegrese de hacerle al mdico cualquier pregunta que tenga. Document Released: 01/10/2011 Document Revised: 01/17/2016 Document Reviewed: 11/19/2014 Elsevier  Interactive Patient Education  2017 [place] de Glass blower/designer (Breast Self-Awareness) El autoexamen de [place] significa lo siguiente:  Saber cul es el aspecto de las [place].  Saber cmo se las siente al tacto.  Controlarse las [employer] para Transport planner.  Informar el mdico si advierte un cambio en las mamas. El autoexamen de mama le permite advertir problemas en la mama con anticipacin, cuando todava son pequeos. [place] forma de aprender qu es normal para las mamas y revisar si hay cambios es hacerse un autoexamen de las [place]. Para hacer un autoexamen de las mamas: Busque cambios  5. Qutese toda la ropa por encima de la cintura. 6. Prese frente a un espejo en una habitacin con buena iluminacin. 7. Apoye las manos en las caderas. 8. Empuje hacia abajo con las manos. [place] y los pezones en el espejo, para ver si hay diferencias entre s. Durante el examen, intente determinar si:  La forma de  una mama es diferente.  El tamao de una mama es diferente.  Hay arrugas, depresiones y protuberancias en [place] y no en la otra. 6. Observe cada mama para buscar cambios en la piel, por ejemplo:  Enrojecimiento.  Zonas escamosas. 7. Observe si hay cambios en los pezones, por ejemplo:  [place]qu[place] alrededor [employer].  Hemorragia.  Hoyuelos.  Enrojecimiento.  Un cambio en el lugar de los pezones. Plpese para detectar si hay cambios  1. Acustese en el piso boca arriba. 2. Plpese cada mama. Para hacerlo, siga estos pasos:  Elija una mama para palpar.  Coloque el brazo ms cercano a esa mama por encima de la cabeza.  Use el otro brazo para palpar la zona del pezn de la mama. Plpese la zona con las yemas de los tres dedos del medio y haga crculos con los dedos. Con el primer crculo, presione suavemente. Con el segundo, ms fuerte. Con el tercero, an ms fuerte.  Siga haciendo crculos con  los dedos con la presin [place], fuerte e incluso ms fuerte a medida que desciende por la mama. Detngase cuando sienta las costillas.  Desplace los dedos un poco hacia el centro del cuerpo.  Empiece a hacer crculos con los dedos nuevamente y esta vez haga movimientos ascendentes hasta llegar a la clavcula.  Siga haciendo crculos [place] y [place] llegar a la axila. Recuerde hacerlos con las tres presiones.  Plpese la otra mama de la misma forma. 1. Sintese o prese en la ducha o la baera. 2. Con agua jabonosa en la piel, plpese cada mama del mismo modo que lo hizo en el paso2, mientras estaba acostada en el piso. Anote sus hallazgos  Despus del autoexamen, anote lo siguiente:  Qu es normal para cada mama.  Cualquier cambio que haya encontrado en cada mama.  Cundo tuvo la ltima [place]. CON QU FRECUENCIA DEBO EXAMINARME LAS MAMAS? Examnese las [employer]. Si est amamantando, el mejor momento para el examen es despus de darle de mamar al beb o despus de usar un sacaleches. Si menstra, el mejor momento para [place] es 5 a 7das despus de la finalizada la [place]. [place]? Visite al mdico si advierte lo siguiente:  Un cambio en la forma o el tamao de las mamas o los pezones.  Un cambio en la piel de las mamas o los pezones, como la piel enrojecida o escamosa.  Secrecin de un lquido fuera de lo comn de los pezones.  Un ndulo o una zona engrosada que no tena antes.  Dolor de [place].  Cualquier cosa que la preocupe. Esta informacin no tiene Marine scientist el consejo del mdico. Asegrese de hacerle al mdico cualquier pregunta que tenga. Document Released: 10/28/2010 Document Revised: 01/17/2016 Document Reviewed: 08/15/2015 Elsevier Interactive Patient Education  2017 [employer].

## 2016-09-11 NOTE — Progress Notes (Signed)
Pt is here today to follow up on bronchitis.

## 2016-09-19 ENCOUNTER — Ambulatory Visit: Payer: Self-pay | Attending: Family Medicine

## 2016-11-23 ENCOUNTER — Ambulatory Visit: Payer: Self-pay | Admitting: Family Medicine

## 2016-11-24 ENCOUNTER — Ambulatory Visit: Payer: Self-pay | Attending: Family Medicine | Admitting: Family Medicine

## 2016-11-24 ENCOUNTER — Encounter: Payer: Self-pay | Admitting: Family Medicine

## 2016-11-24 VITALS — BP 106/72 | HR 68 | Temp 98.3°F | Ht 63.0 in | Wt 199.2 lb

## 2016-11-24 DIAGNOSIS — K2100 Gastro-esophageal reflux disease with esophagitis, without bleeding: Secondary | ICD-10-CM

## 2016-11-24 DIAGNOSIS — R079 Chest pain, unspecified: Secondary | ICD-10-CM | POA: Insufficient documentation

## 2016-11-24 DIAGNOSIS — M545 Low back pain: Secondary | ICD-10-CM | POA: Insufficient documentation

## 2016-11-24 DIAGNOSIS — J00 Acute nasopharyngitis [common cold]: Secondary | ICD-10-CM | POA: Insufficient documentation

## 2016-11-24 DIAGNOSIS — R0602 Shortness of breath: Secondary | ICD-10-CM | POA: Insufficient documentation

## 2016-11-24 DIAGNOSIS — G935 Compression of brain: Secondary | ICD-10-CM | POA: Insufficient documentation

## 2016-11-24 MED ORDER — FLUTICASONE PROPIONATE 50 MCG/ACT NA SUSP: 2.0000 | Freq: Every day | NASAL | 6 refills | Status: DC

## 2016-11-24 MED ORDER — IBUPROFEN 600 MG PO TABS: 600.0000 mg | ORAL_TABLET | Freq: Two times a day (BID) | ORAL | 0 refills | Status: DC

## 2016-11-24 MED ORDER — RANITIDINE HCL 300 MG PO TABS: 300.0000 mg | ORAL_TABLET | Freq: Every day | ORAL | 5 refills | Status: DC

## 2016-11-24 MED ORDER — ALBUTEROL SULFATE (2.5 MG/3ML) 0.083% IN NEBU
2.5000 mg | INHALATION_SOLUTION | Freq: Once | RESPIRATORY_TRACT | Status: AC
  Administered 2016-11-24: 2.5 mg via RESPIRATORY_TRACT

## 2016-11-24 MED FILL — IBUPROFEN 600 MG TABLET: 600 | 5 days supply | Qty: 10 | Fill #0

## 2016-11-24 MED FILL — FLUTICASONE PROP 50 MCG SPR: 50 | 30 days supply | Qty: 16 | Fill #0

## 2016-11-24 MED FILL — raNITIdine HCL 300 MG TABS: 300 | 30 days supply | Qty: 30 | Fill #0

## 2016-11-24 NOTE — Assessment & Plan Note (Signed)
Pleuritic type chest pain Normal exam No tachycardia or hypoxemia  Plan: NSAID

## 2016-11-24 NOTE — Patient Instructions (Addendum)
Marissa Jimenez was seen today for chest pain.  Diagnoses and all orders for this visit:  Gastroesophageal reflux disease with esophagitis -     ranitidine (ZANTAC) 300 MG tablet; Take 1 tablet (300 mg total) by mouth at bedtime.  Other acute rhinitis -     fluticasone (FLONASE) 50 MCG/ACT nasal spray; Place 2 sprays into both nostrils daily.  Chest pain, unspecified type -     albuterol (PROVENTIL) (2.5 MG/3ML) 0.083% nebulizer solution 2.5 mg; Take 3 mLs (2.5 mg total) by nebulization once. -     ibuprofen (ADVIL,MOTRIN) 600 MG tablet; Take 1 tablet (600 mg total) by mouth 2 (two) times daily with a meal. For 5-7 days for chest pains   F/u 2 weeks for chest pains  Dr. Adrian Blackwater

## 2016-11-24 NOTE — Assessment & Plan Note (Signed)
flonase

## 2016-11-24 NOTE — Progress Notes (Signed)
Pt is here today for chest pains. Pt states that pains are in center of chest.  Peak flow results:90,100,240(before neb treatment) Peak flow results:140,170,330(after neb treatment)

## 2016-11-24 NOTE — Progress Notes (Signed)
Subjective:  Patient ID: Marissa Jimenez, female    DOB: 1976/08/01  Age: 41 y.o. 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 Chest pain: substernal chest pains and shortness of breath x 2 months. She also has discomfort at her nasal bridge. No fever. No cough. No runny nose. She does have pain in frontal head. She has pain in her chest when  she breathes in.  Pain occurs at rest. Pain feels like pressure. Sometimes has a burning feeling in her throat. Also has a sour taste in her mouth in the mornings. She is no longer taking zantac.   Social History  Substance Use Topics  . Smoking status: Never Smoker  . Smokeless tobacco: Never Used  . Alcohol use No    Outpatient Medications Prior to Visit  Medication Sig Dispense Refill  . cyclobenzaprine (FLEXERIL) 10 MG tablet Take 1 tablet (10 mg total) by mouth 3 (three) times daily as needed for muscle spasms. (Patient not taking: Reported on 11/24/2016) 30 tablet 3  . ibuprofen (ADVIL,MOTRIN) 600 MG tablet Take 1 tablet (600 mg total) by mouth every 8 (eight) hours as needed. (Patient not taking: Reported on 11/24/2016) 30 tablet 0  . ranitidine (ZANTAC) 300 MG tablet Take 1 tablet (300 mg total) by mouth at bedtime. (Patient not taking: Reported on 11/24/2016) 30 tablet 5   No facility-administered medications prior to visit.     ROS Review of Systems  Constitutional: Negative for chills and fever.  HENT: Positive for sinus pressure.   Eyes: Negative for visual disturbance.  Respiratory: Negative for shortness of breath.   Cardiovascular: Positive for chest pain.  Gastrointestinal: Negative for abdominal pain and blood in stool.  Musculoskeletal: Negative for arthralgias and back pain.  Skin: Negative for rash.  Allergic/Immunologic: Negative for immunocompromised state.    Hematological: Negative for adenopathy. Does not bruise/bleed easily.  Psychiatric/Behavioral: Negative for dysphoric mood and suicidal ideas.    Objective:  BP 106/72 (BP Location: Right Arm, Patient Position: Sitting, Cuff Size: Small)   Pulse 68   Temp 98.3 F (36.8 C) (Oral)   Ht 5\' 3"  (1.6 m)   Wt 199 lb 3.2 oz (90.4 kg)   LMP 11/13/2016   SpO2 98%   BMI 35.29 kg/m  Ambulatory pulse ox 97-100% BP/Weight 11/24/2016 09/11/2016 0000000  Systolic BP [id number]  Diastolic BP 72 79 73  Wt. (Lbs) 199.2 192.2 192  BMI 35.29 34.05 33.48   Physical Exam  Constitutional: She is oriented to person, place, and time. She appears well-developed and well-nourished. No distress.  HENT:  Head: Normocephalic and atraumatic.  Nose: Mucosal edema present.  Cardiovascular: Normal rate, regular rhythm, normal heart sounds and intact distal pulses.   Pulmonary/Chest: Effort normal and breath sounds normal.  Musculoskeletal: She exhibits no edema.  Neurological: She is alert and oriented to person, place, and time.  Skin: Skin is warm and dry. No rash noted.  Psychiatric: She has a normal mood and affect.   Pre-peak flow-240 Albuterol neb 2.5 mg x 1 Post peak-flow 330  Assessment & Plan:  Marissa Jimenez was seen today for chest pain.  Diagnoses and all orders for this visit:  Gastroesophageal reflux disease with esophagitis -     ranitidine (ZANTAC) 300 MG tablet; Take 1 tablet (300 mg total) by mouth  at bedtime.  Other acute rhinitis -     fluticasone (FLONASE) 50 MCG/ACT nasal spray; Place 2 sprays into both nostrils daily.  Chest pain, unspecified type -     albuterol (PROVENTIL) (2.5 MG/3ML) 0.083% nebulizer solution 2.5 mg; Take 3 mLs (2.5 mg total) by nebulization once. -     ibuprofen (ADVIL,MOTRIN) 600 MG tablet; Take 1 tablet (600 mg total) by mouth 2 (two) times daily with a meal. For 5-7 days for chest pains   There are no diagnoses linked to this encounter.  No orders of  the defined types were placed in this encounter.   Follow-up: Return in about 2 weeks (around 12/08/2016) for chest pain .   Boykin Nearing MD

## 2016-11-24 NOTE — Assessment & Plan Note (Signed)
Zantac 

## 2016-12-08 ENCOUNTER — Ambulatory Visit: Payer: Self-pay | Attending: Family Medicine | Admitting: Family Medicine

## 2016-12-08 ENCOUNTER — Other Ambulatory Visit: Payer: Self-pay

## 2016-12-08 ENCOUNTER — Encounter: Payer: Self-pay | Admitting: Family Medicine

## 2016-12-08 VITALS — BP 110/66 | HR 66 | Temp 98.0°F | Ht 63.0 in | Wt 200.0 lb

## 2016-12-08 DIAGNOSIS — K219 Gastro-esophageal reflux disease without esophagitis: Secondary | ICD-10-CM | POA: Insufficient documentation

## 2016-12-08 DIAGNOSIS — M545 Low back pain: Secondary | ICD-10-CM | POA: Insufficient documentation

## 2016-12-08 DIAGNOSIS — J0110 Acute frontal sinusitis, unspecified: Secondary | ICD-10-CM | POA: Insufficient documentation

## 2016-12-08 DIAGNOSIS — R0602 Shortness of breath: Secondary | ICD-10-CM | POA: Insufficient documentation

## 2016-12-08 DIAGNOSIS — R001 Bradycardia, unspecified: Secondary | ICD-10-CM | POA: Insufficient documentation

## 2016-12-08 DIAGNOSIS — R079 Chest pain, unspecified: Secondary | ICD-10-CM | POA: Insufficient documentation

## 2016-12-08 MED ORDER — AMOXICILLIN 500 MG PO CAPS: 500.0000 mg | ORAL_CAPSULE | Freq: Three times a day (TID) | ORAL | 0 refills | Status: AC

## 2016-12-08 MED FILL — AMOXICILLIN 500 MG CAPSULE: 500 | 10 days supply | Qty: 30 | Fill #0

## 2016-12-08 NOTE — Assessment & Plan Note (Signed)
Pain in frontal sinuses Amoxicillin course

## 2016-12-08 NOTE — Progress Notes (Signed)
Pt is here today to follow up on chest pains. Pt states that she is still having chest pains.

## 2016-12-08 NOTE — Progress Notes (Signed)
Subjective:  Patient ID: Marissa Jimenez, female    DOB: Nov 12, 1975  Age: 41 y.o. MRN: [id number]  CC: Chest Pain   Fabio Bering  Spanish interpreter ID # [id number]  HPI Marissa Jimenez has hx of low back pain, Chiari 1 malformation, GERD, chronic non smoker she  presents for   1. Chest pain: substernal chest pains and shortness of breath x 2 months. She also has discomfort at her nasal bridge. No fever. No cough. No runny nose. She does have pain in frontal head. She has pain in her chest when  she breathes in.  Pain occurs at rest. Pain feels like pressure. Sometimes has a burning feeling in her throat. Also has a sour taste in her mouth in the mornings. She is no longer taking zantac.   She denies improvement in chest pain with ibuprofen.  She is taking zantac. She denies improvement in pain.  She continue to have pain and pressure along the bridge of her nose and forehead.   Social History  Substance Use Topics  . Smoking status: Never Smoker  . Smokeless tobacco: Never Used  . Alcohol use No    Outpatient Medications Prior to Visit  Medication Sig Dispense Refill  . fluticasone (FLONASE) 50 MCG/ACT nasal spray Place 2 sprays into both nostrils daily. 16 g 6  . ranitidine (ZANTAC) 300 MG tablet Take 1 tablet (300 mg total) by mouth at bedtime. 30 tablet 5  . ibuprofen (ADVIL,MOTRIN) 600 MG tablet Take 1 tablet (600 mg total) by mouth 2 (two) times daily with a meal. For 5-7 days for chest pains (Patient not taking: Reported on 12/08/2016) 10 tablet 0   No facility-administered medications prior to visit.     ROS Review of Systems  Constitutional: Negative for chills and fever.  HENT: Positive for sinus pressure.   Eyes: Negative for visual disturbance.  Respiratory: Negative for shortness of breath.   Cardiovascular: Positive for chest pain.  Gastrointestinal: Negative for abdominal pain and blood in stool.  Musculoskeletal: Negative for arthralgias and back pain.  Skin:  Negative for rash.  Allergic/Immunologic: Negative for immunocompromised state.  Hematological: Negative for adenopathy. Does not bruise/bleed easily.  Psychiatric/Behavioral: Negative for dysphoric mood and suicidal ideas.    Objective:  BP 110/66 (BP Location: Left Arm, Patient Position: Sitting, Cuff Size: Small)   Pulse 66   Temp 98 F (36.7 C) (Oral)   Ht 5\' 3"  (1.6 m)   Wt 200 lb (90.7 kg)   LMP 11/13/2016   SpO2 98%   BMI 35.43 kg/m   BP/Weight 12/08/2016 11/24/2016 [id number]  Systolic BP A999333 [id number]  Diastolic BP 66 72 79  Wt. (Lbs) 200 199.2 192.2  BMI 35.43 35.29 34.05   Physical Exam  Constitutional: She is oriented to person, place, and time. She appears well-developed and well-nourished. No distress.  HENT:  Head: Normocephalic and atraumatic.  Nose: Mucosal edema present.  Cardiovascular: Normal rate, regular rhythm, normal heart sounds and intact distal pulses.   Pulmonary/Chest: Effort normal and breath sounds normal.  Musculoskeletal: She exhibits no edema.  Neurological: She is alert and oriented to person, place, and time.  Skin: Skin is warm and dry. No rash noted.  Psychiatric: She has a normal mood and affect.   EKG: normal EKG, normal sinus rhythm.  Assessment & Plan:  Marissa Jimenez was seen today for chest pain.  Diagnoses and all orders for this visit:  Chest pain, unspecified type -     EKG 12-Lead -  DG Chest 2 View; Future  Acute non-recurrent frontal sinusitis -     amoxicillin (AMOXIL) 500 MG capsule; Take 1 capsule (500 mg total) by mouth 3 (three) times daily.   There are no diagnoses linked to this encounter.  No orders of the defined types were placed in this encounter.   Follow-up: Return in about 4 weeks (around 01/05/2017) for chest pain .   Boykin Nearing MD

## 2016-12-08 NOTE — Assessment & Plan Note (Signed)
Atypical chest pain Normal EKG No improvement with NSAID  Plan; CXR Continue zantac

## 2016-12-08 NOTE — Patient Instructions (Signed)
Marissa Jimenez was seen today for chest pain.  Diagnoses and all orders for this visit:  Chest pain, unspecified type -     EKG 12-Lead -     DG Chest 2 View; Future  Acute non-recurrent frontal sinusitis -     amoxicillin (AMOXIL) 500 MG capsule; Take 1 capsule (500 mg total) by mouth 3 (three) times daily.   Please complete chest x-ray at [facility] Radiology Department First floor    F/u in 4 week for chest pain, sooner if needed   Dr. Adrian Blackwater

## 2016-12-11 ENCOUNTER — Ambulatory Visit (HOSPITAL_COMMUNITY)
Admission: RE | Admit: 2016-12-11 | Discharge: 2016-12-11 | Disposition: A | Discharge status: Home / Self Care | Payer: Self-pay | Source: Ambulatory Visit | Attending: Family Medicine | Admitting: Family Medicine

## 2016-12-11 DIAGNOSIS — R079 Chest pain, unspecified: Secondary | ICD-10-CM | POA: Insufficient documentation

## 2016-12-13 ENCOUNTER — Telehealth: Payer: Self-pay

## 2016-12-13 NOTE — Telephone Encounter (Signed)
Pt was called and informed of xray results. 

## 2017-02-20 ENCOUNTER — Encounter: Payer: Self-pay | Admitting: Family Medicine

## 2017-02-20 ENCOUNTER — Ambulatory Visit: Payer: No Typology Code available for payment source | Attending: Family Medicine | Admitting: Family Medicine

## 2017-02-20 VITALS — BP 104/66 | HR 61 | Temp 98.4°F | Ht 63.0 in | Wt 204.2 lb

## 2017-02-20 DIAGNOSIS — D2230 Melanocytic nevi of unspecified part of face: Secondary | ICD-10-CM | POA: Insufficient documentation

## 2017-02-20 DIAGNOSIS — B079 Viral wart, unspecified: Secondary | ICD-10-CM | POA: Insufficient documentation

## 2017-02-20 DIAGNOSIS — K2100 Gastro-esophageal reflux disease with esophagitis, without bleeding: Secondary | ICD-10-CM

## 2017-02-20 DIAGNOSIS — M25561 Pain in right knee: Secondary | ICD-10-CM | POA: Insufficient documentation

## 2017-02-20 DIAGNOSIS — Z131 Encounter for screening for diabetes mellitus: Secondary | ICD-10-CM | POA: Insufficient documentation

## 2017-02-20 LAB — POCT GLYCOSYLATED HEMOGLOBIN (HGB A1C): HEMOGLOBIN A1C: 5.9

## 2017-02-20 MED ORDER — GLUCOSAMINE-CHONDROITIN 750-600 MG PO TABS: 2.0000 | ORAL_TABLET | Freq: Every day | ORAL | 0 refills | Status: DC

## 2017-02-20 MED ORDER — NAPROXEN 500 MG PO TABS: 500.0000 mg | ORAL_TABLET | Freq: Two times a day (BID) | ORAL | 0 refills | Status: DC

## 2017-02-20 MED ORDER — TURMERIC 500 MG PO CAPS: 500.0000 mg | ORAL_CAPSULE | Freq: Every day | ORAL | Status: DC

## 2017-02-20 MED FILL — FLUTICASONE PROP 50 MCG SPR: 50 | 30 days supply | Qty: 16 | Fill #1

## 2017-02-20 MED FILL — raNITIdine HCL 300 MG TABS: 300 | 30 days supply | Qty: 30 | Fill #1

## 2017-02-20 MED FILL — NAPROXEN 500 MG TABLET: 500 | 15 days supply | Qty: 30 | Fill #0

## 2017-02-20 NOTE — Progress Notes (Signed)
Subjective:  Patient ID: Marissa Jimenez, female    DOB: 1976/07/26  Age: 41 y.o. MRN: [id number]  CC: Follow-up and Knee Pain   Rose Spanish interpreter ID # [phone] HPI Marissa Jimenez has hx of low back pain, Chiari 1 malformation, GERD, chronic non smoker she  presents for   1. Chest pains and sinusitis follow up : substernal chest pains and shortness of breath x 2 months. She also has discomfort at her nasal bridge. No fever. No cough. No runny nose. She does have pain in frontal head. She has pain in her chest when  she breathes in.  Pain occurs at rest. Pain feels like pressure. Sometimes has a burning feeling in her throat. Also has a sour taste in her mouth in the mornings. She is no longer taking zantac.   She denies improvement in chest pain with ibuprofen.  She is taking zantac. She denies improvement in pain.  She continue to have pain and pressure along the bridge of her nose and forehead.   Today she reports, she has taking amoxicillin. Her nasal bridge pain has improved to minimal pain. She denies runny nose or sneezing. No fever. She has less frontal headache.   She also reports chest pain and shortness of breath has improved. She gets a little chest pain usually in the night. She did take zantac. She reports her symptoms were less when she took zantac. She has a little burning in her throat.   2. R knee pain: for the past 4 days. Medial knee pain. Some swelling. She knees injury. She reports she did have a similar pain one month ago that improved with use of flor de anica, pain relief ointment derived from the arnica flower.   3. Facial lesions: on R temple and R upper eyelid medially. Painless. Growing. Present on her eye lid since she was a child. Present on her R temple for 2 years.    Social History  Substance Use Topics  . Smoking status: Never Smoker  . Smokeless tobacco: Never Used  . Alcohol use No    Outpatient Medications Prior to Visit  Medication  Sig Dispense Refill  . fluticasone (FLONASE) 50 MCG/ACT nasal spray Place 2 sprays into both nostrils daily. (Patient not taking: Reported on 02/20/2017) 16 g 6  . ranitidine (ZANTAC) 300 MG tablet Take 1 tablet (300 mg total) by mouth at bedtime. (Patient not taking: Reported on 02/20/2017) 30 tablet 5   No facility-administered medications prior to visit.     ROS Review of Systems  Constitutional: Negative for chills and fever.  HENT: Negative for sinus pressure.   Eyes: Negative for visual disturbance.  Respiratory: Negative for shortness of breath.   Cardiovascular: Negative for chest pain.  Gastrointestinal: Negative for abdominal pain and blood in stool.  Musculoskeletal: Positive for arthralgias (R knee ). Negative for back pain.  Skin: Negative for rash.  Allergic/Immunologic: Negative for immunocompromised state.  Hematological: Negative for adenopathy. Does not bruise/bleed easily.  Psychiatric/Behavioral: Negative for dysphoric mood and suicidal ideas.    Objective:  BP 104/66   Pulse 61   Temp 98.4 F (36.9 C) (Oral)   Ht 5\' 3"  (1.6 m)   Wt 204 lb 3.2 oz (92.6 kg)   LMP 02/15/2017   SpO2 99%   BMI 36.17 kg/m   BP/Weight 02/20/2017 12/08/2016 4/69/6295  Systolic BP 284 132 440  Diastolic BP 66 66 72  Wt. (Lbs) 204.2 200 199.2  BMI 36.17 35.43 35.29  Physical Exam  Constitutional: She is oriented to person, place, and time. She appears well-developed and well-nourished. No distress.  HENT:  Head: Normocephalic and atraumatic.    Nose: Mucosal edema present.  Eyes:    Cardiovascular: Normal rate, regular rhythm, normal heart sounds and intact distal pulses.   Pulmonary/Chest: Effort normal and breath sounds normal.  Musculoskeletal: She exhibits no edema.       Right knee: She exhibits swelling (medial knee ). She exhibits normal range of motion. No tenderness found.  Neurological: She is alert and oriented to person, place, and time.  Skin: Skin is warm  and dry. No rash noted.  Psychiatric: She has a normal mood and affect.    Lab Results  Component Value Date   HGBA1C 5.9 02/20/2017    Assessment & Plan:  Marissa Jimenez was seen today for follow-up and knee pain.  Diagnoses and all orders for this visit:  Gastroesophageal reflux disease with esophagitis  Right medial knee pain -     naproxen (NAPROSYN) 500 MG tablet; Take 1 tablet (500 mg total) by mouth 2 (two) times daily with a meal. For 5 days for R knee pain -     Glucosamine-Chondroitin 750-600 MG TABS; Take 2 tablets by mouth daily. -     Turmeric 500 MG CAPS; Take 500 mg by mouth daily.  Diabetes mellitus screening -     Lipid Panel -     HgB A1c  Wart of face -     Ambulatory referral to Dermatology  Nevus of face -     Ambulatory referral to Dermatology   There are no diagnoses linked to this encounter.  No orders of the defined types were placed in this encounter.   Follow-up: Return in about 3 months (around 05/23/2017).   Boykin Nearing MD

## 2017-02-20 NOTE — Assessment & Plan Note (Signed)
R knee pain Suspect mild OA Plan: Naproxen 500 mg BID for 5 days  Advised tumeric and glucosamine-chondroitin supplement

## 2017-02-20 NOTE — Patient Instructions (Addendum)
Marissa Jimenez was seen today for follow-up and knee pain.  Diagnoses and all orders for this visit:  Gastroesophageal reflux disease with esophagitis  Right medial knee pain -     naproxen (NAPROSYN) 500 MG tablet; Take 1 tablet (500 mg total) by mouth 2 (two) times daily with a meal. For 5 days for R knee pain -     Glucosamine-Chondroitin 750-600 MG TABS; Take 2 tablets by mouth daily. -     Turmeric 500 MG CAPS; Take 500 mg by mouth daily.  Diabetes mellitus screening -     Lipid Panel -     HgB A1c  Wart of face -     Ambulatory referral to Dermatology   Continue zantac for another month, then as needed   f/u in 3 months   Dr. Adrian Blackwater

## 2017-02-20 NOTE — Assessment & Plan Note (Addendum)
R upper eyelid Patient desires removal Derm referral placed

## 2017-02-20 NOTE — Assessment & Plan Note (Signed)
R orbit area No atypical features  Derm referral placed

## 2017-02-21 ENCOUNTER — Encounter: Payer: Self-pay | Admitting: Family Medicine

## 2017-02-21 LAB — LIPID PANEL
CHOLESTEROL TOTAL: 189 mg/dL (ref 100–199)
Chol/HDL Ratio: 2.8 ratio (ref 0.0–4.4)
HDL: 67 mg/dL (ref >39)
LDL CALC: 106 mg/dL — AB (ref 0–99)
Triglycerides: 78 mg/dL (ref 0–149)
VLDL Cholesterol Cal: 16 mg/dL (ref 5–40)

## 2017-03-14 ENCOUNTER — Telehealth: Payer: Self-pay

## 2017-03-14 NOTE — Telephone Encounter (Signed)
Pt was called and informed of lab results.  [phone]

## 2017-03-23 ENCOUNTER — Ambulatory Visit: Payer: Self-pay | Attending: Family Medicine

## 2017-05-15 ENCOUNTER — Ambulatory Visit: Payer: Self-pay | Attending: Family Medicine | Admitting: Family Medicine

## 2017-05-15 ENCOUNTER — Encounter: Payer: Self-pay | Admitting: Family Medicine

## 2017-05-15 VITALS — BP 100/62 | HR 65 | Temp 98.4°F | Ht 63.0 in | Wt 202.2 lb

## 2017-05-15 DIAGNOSIS — G935 Compression of brain: Secondary | ICD-10-CM | POA: Insufficient documentation

## 2017-05-15 DIAGNOSIS — K2100 Gastro-esophageal reflux disease with esophagitis, without bleeding: Secondary | ICD-10-CM

## 2017-05-15 DIAGNOSIS — M545 Low back pain: Secondary | ICD-10-CM | POA: Insufficient documentation

## 2017-05-15 MED ORDER — RANITIDINE HCL 300 MG PO TABS: 300.0000 mg | ORAL_TABLET | Freq: Every evening | ORAL | 5 refills | Status: DC | PRN

## 2017-05-15 NOTE — Progress Notes (Signed)
Subjective:  Patient ID: Marissa Jimenez, female    DOB: Sep 27, 1976  Age: 41 y.o. MRN: [id number]  CC: Gastroesophageal Reflux   Marissa Jimenez Spanish interpreter ID # [id number]  Marissa Jimenez has hx of low back pain, Chiari 1 malformation, GERD, chronic non smoker she  presents for   1. GERD: she is not taking zantac. She has intermittent substernal chest pains and soreness in throat. Symptoms occur 2-3 times per week.  No abdominal pain, fever, chills or emesis.    Social History  Substance Use Topics  . Smoking status: Never Smoker  . Smokeless tobacco: Never Used  . Alcohol use No    Outpatient Medications Prior to Visit  Medication Sig Dispense Refill  . fluticasone (FLONASE) 50 MCG/ACT nasal spray Place 2 sprays into both nostrils daily. (Patient not taking: Reported on 02/20/2017) 16 g 6  . Glucosamine-Chondroitin 750-600 MG TABS Take 2 tablets by mouth daily. (Patient not taking: Reported on 05/15/2017)  0  . naproxen (NAPROSYN) 500 MG tablet Take 1 tablet (500 mg total) by mouth 2 (two) times daily with a meal. For 5 days for R knee pain (Patient not taking: Reported on 05/15/2017) 30 tablet 0  . ranitidine (ZANTAC) 300 MG tablet Take 1 tablet (300 mg total) by mouth at bedtime. (Patient not taking: Reported on 02/20/2017) 30 tablet 5  . Turmeric 500 MG CAPS Take 500 mg by mouth daily. (Patient not taking: Reported on 05/15/2017)     No facility-administered medications prior to visit.     ROS Review of Systems  Constitutional: Negative for chills and fever.  HENT: Negative for sinus pressure.   Eyes: Negative for visual disturbance.  Respiratory: Negative for shortness of breath.   Cardiovascular: Negative for chest pain.  Gastrointestinal: Negative for abdominal pain and blood in stool.  Musculoskeletal: Negative for arthralgias and back pain.  Skin: Negative for rash.  Allergic/Immunologic: Negative for immunocompromised state.  Hematological: Negative for adenopathy.  Does not bruise/bleed easily.  Psychiatric/Behavioral: Negative for dysphoric mood and suicidal ideas.    Objective:  BP 100/62   Pulse 65   Temp 98.4 F (36.9 C) (Oral)   Ht 5\' 3"  (1.6 m)   Wt 202 lb 3.2 oz (91.7 kg)   SpO2 98%   BMI 35.82 kg/m   BP/Weight 05/15/2017 06/02/36 0/01/8888  Systolic BP 169 450 388  Diastolic BP 62 66 66  Wt. (Lbs) 202.2 204.2 200  BMI 35.82 36.17 35.43   Physical Exam  Constitutional: She is oriented to person, place, and time. She appears well-developed and well-nourished. No distress.  HENT:  Head: Normocephalic and atraumatic.    Nose: Mucosal edema present.  Eyes:    Cardiovascular: Normal rate, regular rhythm, normal heart sounds and intact distal pulses.   Pulmonary/Chest: Effort normal and breath sounds normal.  Abdominal: Soft. Bowel sounds are normal. She exhibits no distension and no mass. There is no tenderness. There is no rebound and no guarding.  Musculoskeletal: She exhibits no edema.       Right knee: She exhibits swelling (medial knee ). She exhibits normal range of motion. No tenderness found.  Neurological: She is alert and oriented to person, place, and time.  Skin: Skin is warm and dry. No rash noted.  Psychiatric: She has a normal mood and affect.    Lab Results  Component Value Date   HGBA1C 5.9 02/20/2017    Assessment & Plan:  Marissa Jimenez was seen today for gastroesophageal reflux.  Diagnoses and all  orders for this visit:  Gastroesophageal reflux disease with esophagitis -     ranitidine (ZANTAC) 300 MG tablet; Take 1 tablet (300 mg total) by mouth at bedtime as needed for heartburn.   There are no diagnoses linked to this encounter.  No orders of the defined types were placed in this encounter.   Follow-up: Return in about 4 weeks (around 06/12/2017) for flu shot, RN visit .   Boykin Nearing MD

## 2017-05-15 NOTE — Patient Instructions (Addendum)
Marissa Jimenez was seen today for gastroesophageal reflux.  Diagnoses and all orders for this visit:  Gastroesophageal reflux disease with esophagitis -     ranitidine (ZANTAC) 300 MG tablet; Take 1 tablet (300 mg total) by mouth at bedtime as needed for heartburn.   F/u in September for flu shot this is an RN visit or during a flu clinic   F/u in May -July 2020 for your next pap smear and wellness physical. Return sooner if needed   Dr. Adrian Blackwater

## 2017-05-17 NOTE — Assessment & Plan Note (Signed)
GERD Prn zantac

## 2017-05-18 ENCOUNTER — Ambulatory Visit: Payer: Self-pay | Admitting: Family Medicine

## 2017-07-18 ENCOUNTER — Ambulatory Visit: Payer: Self-pay | Attending: Internal Medicine

## 2017-09-13 ENCOUNTER — Encounter: Payer: Self-pay | Admitting: Physician Assistant

## 2017-09-13 ENCOUNTER — Other Ambulatory Visit: Payer: Self-pay

## 2017-09-13 ENCOUNTER — Ambulatory Visit: Payer: Self-pay | Attending: Internal Medicine | Admitting: Physician Assistant

## 2017-09-13 VITALS — BP 104/70 | HR 63 | Temp 98.6°F | Resp 16 | Wt 199.2 lb

## 2017-09-13 DIAGNOSIS — R0981 Nasal congestion: Secondary | ICD-10-CM | POA: Insufficient documentation

## 2017-09-13 DIAGNOSIS — X501XXA Overexertion from prolonged static or awkward postures, initial encounter: Secondary | ICD-10-CM | POA: Insufficient documentation

## 2017-09-13 DIAGNOSIS — K2100 Gastro-esophageal reflux disease with esophagitis, without bleeding: Secondary | ICD-10-CM

## 2017-09-13 DIAGNOSIS — Z789 Other specified health status: Secondary | ICD-10-CM

## 2017-09-13 DIAGNOSIS — S93401A Sprain of unspecified ligament of right ankle, initial encounter: Secondary | ICD-10-CM | POA: Insufficient documentation

## 2017-09-13 DIAGNOSIS — R1310 Dysphagia, unspecified: Secondary | ICD-10-CM | POA: Insufficient documentation

## 2017-09-13 MED ORDER — RANITIDINE HCL 300 MG PO TABS: 300.0000 mg | ORAL_TABLET | Freq: Every evening | ORAL | 5 refills | Status: DC | PRN

## 2017-09-13 MED ORDER — FLUTICASONE PROPIONATE 50 MCG/ACT NA SUSP: 2.0000 | Freq: Every day | NASAL | 6 refills | Status: DC

## 2017-09-13 MED FILL — raNITIdine HCL 300 MG TABS: 300 | 30 days supply | Qty: 30 | Fill #0

## 2017-09-13 MED FILL — FLUTICASONE PROP 50 MCG SPR: 50 | 30 days supply | Qty: 16 | Fill #0

## 2017-09-13 NOTE — Progress Notes (Signed)
Patient ID: Marissa Jimenez, female   DOB: 08/02/1976, 41 y.o.   MRN: [id number]    Marissa Jimenez, is a 41 y.o. female  MPN:[id number]  MGQ:[id number]  DOB - 09/14/1976  Subjective:  Chief Complaint and HPI: Marissa Jimenez is a 41 y.o. female here today for Feels like she is getting food stuck in her throat.   Throat is not sore.  Symptoms for 2 weeks.  She stopped taking zantac about 1 month ago.  No changes in BM.  No melena/hematochezia.  No N/V/D.  No early satiety.  No weight loss.  Appetite is good.  No abdominal pain.    She is also clearing her sinuses a lot.  She used to use flonase but hasn't used it for a while.  No f/c.  No sinus pain.  No cough.  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    Also twisted R ankle about 2-3 weeks ago walking down the stairs and she missed a step.  Still having some pain and swelling.  She is able to bear weight.      Social History: 2 children  ROS:   Constitutional:  No f/c, No night sweats, No unexplained weight loss. EENT:  No vision changes, No blurry vision, No hearing changes. No other mouth, throat, or ear problems other than as mentioned aboved Respiratory: No cough, No SOB Cardiac: No CP, no palpitations GI:  No abd pain, No N/V/D. GU: No Urinary s/sx Musculoskeletal: No joint pain Neuro: No headache, no dizziness, no motor weakness.  Skin: No rash Endocrine:  No polydipsia. No polyuria.  Psych: Denies SI/HI  No problems updated.  ALLERGIES: No Known Allergies  PAST MEDICAL HISTORY: Past Medical History:  Diagnosis Date  . Arthritis 2012  . Headache 2013   . Subacromial bursitis 08/04/2013    MEDICATIONS AT HOME: Prior to Admission medications   Medication Sig Start Date End Date Taking? Authorizing Provider  ranitidine (ZANTAC) 300 MG tablet Take 1 tablet (300 mg total) by mouth at bedtime as needed for heartburn. Patient not taking: Reported on 09/13/2017 05/15/17   Boykin Nearing, MD      Objective:  EXAM:   Vitals:   09/13/17 1504  BP: 104/70  Pulse: 63  Resp: 16  Temp: 98.6 F (37 C)  TempSrc: Oral  SpO2: 96%  Weight: 199 lb 3.2 oz (90.4 kg)    General appearance : A&OX3. NAD. Non-toxic-appearing HEENT: Atraumatic and Normocephalic.  PERRLA. EOM intact.  TM clear B. Mouth-MMM, post pharynx WNL w/o erythema, No PND.  + nasal congestion.   Neck: supple, no JVD. No cervical lymphadenopathy. No thyromegaly Chest/Lungs:  Breathing-non-labored, Good air entry bilaterally, breath sounds normal without rales, rhonchi, or wheezing  CVS: S1 S2 regular, no murmurs, gallops, rubs  Abdomen: Bowel sounds present, Non tender and not distended with no gaurding, rigidity or rebound. Extremities: Bilateral Lower Ext shows no edema, both legs are warm to touch with = pulse throughout.  R ankle with mild swelling over R lateral malleolus.  Npo point TTP, ROM WNL.  No erythema Neurology:  CN II-XII grossly intact, Non focal.   Psych:  TP linear. J/I WNL. Normal speech. Appropriate eye contact and affect.  Skin:  No Rash  Data Review Lab Results  Component Value Date   HGBA1C 5.9 02/20/2017   HGBA1C 5.6 04/14/2016   HGBA1C 5.70 12/24/2014     Assessment & Plan   1. Dysphagia, unspecified type Likely precipitated by stopping ZANTAC.  RESTART ZANTAC.  Test  for H. Pylor, CMP  2. Gastroesophageal reflux disease with esophagitis Resume zantac,    3. Nasal congestion Resume flonase  4. Sprain of right ankle, unspecified ligament, initial encounter Ace wrap for support.  No point TTP, no imaging indicated  5. Language barrier [employer] interpreters used and additional time performing visit was required.  Patient have been counseled extensively about nutrition and exercise  Return in about 1 month (around 10/14/2017) for assign new PCP: f/up reflux and congestion.  The patient was given clear instructions to go to ER or return to medical center if symptoms don't  improve, worsen or new problems develop. The patient verbalized understanding. The patient was told to call to get lab results if they haven't heard anything in the next week.     Freeman Caldron, PA-C [facility] [place], [place]   09/13/2017, 3:15 PM

## 2017-09-13 NOTE — Progress Notes (Signed)
"  Feels like something is stuck in my throat"

## 2017-09-14 ENCOUNTER — Other Ambulatory Visit: Payer: Self-pay | Admitting: Physician Assistant

## 2017-09-14 DIAGNOSIS — A048 Other specified bacterial intestinal infections: Secondary | ICD-10-CM

## 2017-09-14 LAB — COMPREHENSIVE METABOLIC PANEL
ALT: 16 IU/L (ref 0–32)
AST: 13 IU/L (ref 0–40)
Albumin/Globulin Ratio: 1.6 (ref 1.2–2.2)
Albumin: 4.4 g/dL (ref 3.5–5.5)
Alkaline Phosphatase: 55 IU/L (ref 39–117)
BUN/Creatinine Ratio: 19 (ref 9–23)
BUN: 10 mg/dL (ref 6–24)
Bilirubin Total: <0.2 mg/dL (ref 0.0–1.2)
CALCIUM: 9.2 mg/dL (ref 8.7–10.2)
CO2: 23 mmol/L (ref 20–29)
CREATININE: 0.52 mg/dL — AB (ref 0.57–1.00)
Chloride: 102 mmol/L (ref 96–106)
GFR calc Af Amer: 137 mL/min/{1.73_m2} (ref >59)
GFR, EST NON AFRICAN AMERICAN: 119 mL/min/{1.73_m2} (ref >59)
Globulin, Total: 2.8 g/dL (ref 1.5–4.5)
Glucose: 90 mg/dL (ref 65–99)
Potassium: 4.3 mmol/L (ref 3.5–5.2)
SODIUM: 138 mmol/L (ref 134–144)
Total Protein: 7.2 g/dL (ref 6.0–8.5)

## 2017-09-14 LAB — H. PYLORI BREATH TEST: H pylori Breath Test: POSITIVE — AB

## 2017-09-14 MED ORDER — CLARITHROMYCIN 500 MG PO TABS: 500.0000 mg | ORAL_TABLET | Freq: Two times a day (BID) | ORAL | 0 refills | Status: DC

## 2017-09-14 MED ORDER — AMOXICILLIN 500 MG PO CAPS: 1000.0000 mg | ORAL_CAPSULE | Freq: Two times a day (BID) | ORAL | 0 refills | Status: DC

## 2017-09-14 MED ORDER — OMEPRAZOLE 20 MG PO CPDR: 20.0000 mg | DELAYED_RELEASE_CAPSULE | Freq: Two times a day (BID) | ORAL | 1 refills | Status: DC

## 2017-09-17 MED FILL — ?CLARITHROMYCIN 500 MG TAB: 500 | 10 days supply | Qty: 20 | Fill #0

## 2017-09-17 MED FILL — ?OMEPRAZOLE DR 20MG CAPSULE: 20 | 15 days supply | Qty: 30 | Fill #0

## 2017-09-17 MED FILL — AMOXICILLIN 500 MG CAPSULE: 500 | 10 days supply | Qty: 40 | Fill #0

## 2017-09-21 ENCOUNTER — Ambulatory Visit: Payer: Self-pay | Attending: Internal Medicine

## 2017-09-24 ENCOUNTER — Other Ambulatory Visit: Payer: Self-pay | Admitting: Obstetrics and Gynecology

## 2017-09-24 DIAGNOSIS — Z1231 Encounter for screening mammogram for malignant neoplasm of breast: Secondary | ICD-10-CM

## 2017-09-26 ENCOUNTER — Telehealth: Payer: Self-pay | Admitting: *Deleted

## 2017-09-26 NOTE — Telephone Encounter (Signed)
Please call patient. Your test for stomach ulcer bacteria was positive. Your other labs were normal. I have sent you 3 prescriptions to our pharmacy to take then follow-up as planned.   Thanks,  Freeman Caldron, PA-C   [place], [id number], [employer] assisted with call. Pt name and DOB verified.

## 2017-10-24 ENCOUNTER — Ambulatory Visit: Payer: Self-pay | Admitting: Nurse Practitioner

## 2017-10-30 ENCOUNTER — Encounter: Payer: Self-pay | Admitting: Nurse Practitioner

## 2017-10-30 ENCOUNTER — Ambulatory Visit: Payer: Self-pay | Attending: Nurse Practitioner | Admitting: Nurse Practitioner

## 2017-10-30 VITALS — BP 118/72 | HR 64 | Temp 98.3°F | Ht 64.0 in | Wt 200.6 lb

## 2017-10-30 DIAGNOSIS — M542 Cervicalgia: Secondary | ICD-10-CM

## 2017-10-30 DIAGNOSIS — R0981 Nasal congestion: Secondary | ICD-10-CM

## 2017-10-30 DIAGNOSIS — G935 Compression of brain: Secondary | ICD-10-CM

## 2017-10-30 DIAGNOSIS — M62838 Other muscle spasm: Secondary | ICD-10-CM | POA: Insufficient documentation

## 2017-10-30 DIAGNOSIS — R7303 Prediabetes: Secondary | ICD-10-CM

## 2017-10-30 DIAGNOSIS — G8929 Other chronic pain: Secondary | ICD-10-CM

## 2017-10-30 DIAGNOSIS — M62830 Muscle spasm of back: Secondary | ICD-10-CM | POA: Insufficient documentation

## 2017-10-30 DIAGNOSIS — Z79899 Other long term (current) drug therapy: Secondary | ICD-10-CM | POA: Insufficient documentation

## 2017-10-30 DIAGNOSIS — M549 Dorsalgia, unspecified: Secondary | ICD-10-CM

## 2017-10-30 DIAGNOSIS — Z23 Encounter for immunization: Secondary | ICD-10-CM

## 2017-10-30 LAB — POCT GLYCOSYLATED HEMOGLOBIN (HGB A1C): HEMOGLOBIN A1C: 5.9

## 2017-10-30 MED ORDER — CYCLOBENZAPRINE HCL 10 MG PO TABS: 10.0000 mg | ORAL_TABLET | Freq: Three times a day (TID) | ORAL | 1 refills | Status: DC | PRN

## 2017-10-30 MED ORDER — FLUTICASONE PROPIONATE 50 MCG/ACT NA SUSP: 2.0000 | Freq: Every day | NASAL | 6 refills | Status: DC

## 2017-10-30 MED FILL — FLUTICASONE PROP 50 MCG SPR: 50 | 30 days supply | Qty: 16 | Fill #2

## 2017-10-30 MED FILL — CYCLOBENZAPRINE 10 MG TAB: 10 | 10 days supply | Qty: 30 | Fill #0

## 2017-10-30 NOTE — Patient Instructions (Addendum)
Malformacin de Chiari (Chiari Malformation) La malformacin de Chiari (MC) es un tipo de anomala que afecta partes del cerebro importantes para el equilibrio (cerebelo) y las funciones corporales bsicas (tronco enceflico). En condiciones normales, el cerebelo se encuentra en la parte posterior del crneo, justo por encima de la apertura de la mdula espinal (agujero magno). En la malformacin de Chiari, una parte del cerebelo est por debajo del agujero magno. Esto puede causar dolor de cuello, problemas de equilibrio y otros sntomas. Hay cinco tipos de malformacin de Chiari.  TipoI. ? Este es el tipo ms frecuente. Puede hacer que el lquido cefalorraqudeo (LCR) no fluya como corresponde entre el cerebro y la mdula espinal. ? Es posible que no cause sntomas, y a menudo pasa desapercibido.  TipoII. ? Este tipo est presente desde el nacimiento (congnito) y, por lo general, se relaciona con un trastorno en el que la columna no se desarrolla como debe (espina bfida). ? El tipoII tambin se caracteriza por el hecho de que una seccin ms grande de estructuras cerebrales descienden y presionan (hernian) a travs del agujero magno hacia el canal espinal.  TipoIII. ? Este tipo, el menos frecuente, es ms grave que los tiposI y II. ? A menudo, se presenta con una discapacidad congnita en la que el cerebelo, el tronco enceflico y las membranas que lo recubren sobresalen y forman una bolsa (encefalocele) a travs de una apertura en el crneo.  TipoIV. ? Este tipo de malformacin de Chiari tambin es grave. ? Es posible que falte una parte del cerebelo, y quizs puedan verse partes de la columna y el crneo.  Tipo0. ? En este tipo, el cerebelo no produce una hernia hacia el interior ni por debajo del agujero magno. Sin embargo, el flujo anmalo de LCR entre el cerebro y la mdula espinal hace que se acumule lquido dentro de la mdula espinal (siringe). Esto puede causar  sntomas. CAUSAS La malformacin de Chiari suele ser congnita. Puede producirse cuando el crneo se forma incorrectamente y, en consecuencia, el cerebelo tiene menos espacio que el normal. En casos poco frecuentes, la malformacin de Chiari tambin puede surgir en un momento posterior de la vida ([place]). Estos casos pueden deberse a lo siguiente:  Lesiones.  Infeccin. Estructura anormal o presin en el cerebro que empuja al cerebelo hacia el agujero magno. FACTORES DE RIESGO La malformacin de Chiari congnita es ms probable en las siguientes personas:  Mujeres.  Personas con antecedentes familiares de malformacin de Chiari. SNTOMAS Es posible que la malformacin de Chiari no cause ningn sntoma. Los sntomas tambin pueden variar segn el tipo y la gravedad de la malformacin de Chiari. Los sntomas pueden aparecer y Armed forces operational officer. El sntoma ms frecuente es el dolor intenso en la parte posterior de la cabeza. El dolor de cabeza puede aparecer y Armed forces operational officer. Tambin puede extenderse al cuello y los hombros. El dolor puede empeorar al toser, estornudar o Orthoptist. Otros sntomas pueden ser los siguientes:  Dificultad para Contractor equilibrio.  Prdida de la coordinacin.  Dificultad para hablar o tragar.  Debilidad muscular.  Mareos.  Pitidos en el odo.  Desmayos.  Dificultad para dormir.  Fatiga.  Hormigueo o ardor en [place] pies.  Trastornos de la audicin.  Problemas de visin.  Vmitos.  Depresin.  Convulsiones. Estas se producen solo en los tipos ms graves de malformacin de Chiari. DIAGNSTICO Este trastorno se puede diagnosticar mediante la historia clnica y un  examen fsico. Esto puede incluir estudios del equilibrio y Sales promotion account executive. Tambin pueden hacerle otros estudios, por ejemplo:  Radiografas.  Tomografa computarizada.  Resonancia magntica. TRATAMIENTO El tratamiento de esta afeccin  depende de los sntomas y del tipo de malformacin de Chiari. Si tiene dolores de [place] o de cuello, puede aliviarlos con analgsicos o Biomedical scientist. Si los sntomas de Scientist, clinical (histocompatibility and immunogenetics) de Chiari son graves o [place] mdico puede recomendar una ciruga para Chief Technology Officer los sntomas y Product/process development scientist que la malformacin empeore. Si no tiene sntomas, es posible que no necesite tratamiento. Alpine los medicamentos solamente como se lo haya indicado el mdico.  Evite las actividades en las que deba hacer fuerza y levantar objetos pesados.  Considere la posibilidad de Chief Financial Officer en un grupo de apoyo para la malformacin de Chiari.  Concurra a todas las visitas de control como se lo haya indicado el mdico. Esto es importante. SOLICITE ATENCIN MDICA SI:  Aparecen nuevos sntomas.  Los sntomas empeoran. SOLICITE ATENCIN MDICA DE INMEDIATO SI:  Tiene convulsiones nuevas o diferentes a las anteriores. Esta informacin no tiene Marine scientist el consejo del mdico. Asegrese de hacerle al mdico cualquier pregunta que tenga. Document Released: 07/16/2013 Document Revised: 02/09/2015 Document Reviewed: 06/03/2014 Elsevier Interactive Patient Education  2018 [employer].

## 2017-10-30 NOTE — Progress Notes (Signed)
Assessment & Plan:  Al was seen today for establish care.  Diagnoses and all orders for this visit:  Nasal congestion -     Ambulatory referral to ENT -     fluticasone (FLONASE) 50 MCG/ACT nasal spray; Place 2 sprays into both nostrils daily.  Chiari I malformation ([place]) -     Ambulatory referral to Neurosurgery  Immunization due -     Flu Vaccine QUAD 6+ mos PF IM (Fluarix Quad PF)  Prediabetes -     HgB A1c INSTRUCTIONS: Work on a low fat, heart healthy diet and participate in regular aerobic exercise program to control as well by working out at least 150 minutes per week. No fried foods. No junk foods, sodas, sugary drinks, unhealthy snacking, or smoking.   Chronic neck and back pain -     Ambulatory referral to Neurosurgery -     cyclobenzaprine (FLEXERIL) 10 MG tablet; Take 1 tablet (10 mg total) by mouth 3 (three) times daily as needed for muscle spasms. INSTRUCTIONS: May alternate with acetominophen and ibuprofen for pain relief. Heat application to neck and back as tolerated to help relieve pain.   Patient has been counseled on age-appropriate routine health concerns for screening and prevention. These are reviewed and up-to-date. Referrals have been placed accordingly. Immunizations are up-to-date or declined.    Subjective:   Chief Complaint  Patient presents with  . Establish Care    Patient is here to establish care. Patient stated her congestion have been a little better. Patient stated she have pain on her back and neck for a week now. Patient took Advil to help the pain.    HPI Marissa Jimenez 42 y.o. female presents to office today to establish care.  Nasal Congestion This has been a chronic ongoing issue.  She reports minimal relief with use of Flonase spray.  She feels constant drainage in her throat. Endorses a constant sensation of something in her throat that she can't cough up. She denies recurrent sinus infections or history of allergic  rhinitis. There are no other upper respiratory symptoms such as cough, rhinorrhea, ear plugged sensation or sore throat. She does endorse frequent headaches.  She has never seen a specialist for her symptoms. WIll refer to ENT.  Back and Neck Pain Endorsing headaches occuring 2-3 days. She has a history of Chiari I malformation seen on MRI of Brain 01-2015. She was referred to Neurosurgery but was unable to afford the COPAY even with the orange card and patient discount. I encouraged her to attempt to make arrangement to pay copay to be evaluated by neuro. I will place another referral. This may be the etiology of her frequent headaches. She does report moderate relief of her back and neck pain with taking flexeril in the past. NSAIDs have provided little relief of pain. MRI of cervical spine in the past has shown minor multilevel cervical spondylosis.    Review of Systems  Constitutional: Negative for fever, malaise/fatigue and weight loss.  HENT: Positive for congestion. Negative for nosebleeds.        SEE HPI  Eyes: Negative.  Negative for blurred vision, double vision and photophobia.  Respiratory: Negative.  Negative for cough and shortness of breath.   Cardiovascular: Negative.  Negative for chest pain, palpitations and leg swelling.  Gastrointestinal: Negative.  Negative for abdominal pain, constipation, diarrhea, heartburn, nausea and vomiting.  Musculoskeletal: Positive for back pain, myalgias and neck pain.       SEE HPI  Neurological: Positive for headaches. Negative for dizziness, focal weakness and seizures.  Endo/Heme/Allergies: Negative for environmental allergies.  Psychiatric/Behavioral: Negative.  Negative for suicidal ideas.    Past Medical History:  Diagnosis Date  . Arthritis 2012  . Headache 2013   . Subacromial bursitis 08/04/2013    Past Surgical History:  Procedure Laterality Date  . CESAREAN SECTION     two c-sections 2002, 2008  . TUBAL LIGATION       Family History  Problem Relation Age of Onset  . Hyperlipidemia Mother   . Arthritis Mother   . Diabetes Father   . Diabetes Sister   . Alcohol abuse Brother   . Cancer Neg Hx     Social History Reviewed with no changes to be made today.   Outpatient Medications Prior to Visit  Medication Sig Dispense Refill  . fluticasone (FLONASE) 50 MCG/ACT nasal spray Place 2 sprays into both nostrils daily. 16 g 6  . amoxicillin (AMOXIL) 500 MG capsule Take 2 capsules (1,000 mg total) by mouth 2 (two) times daily. (Patient not taking: Reported on 10/30/2017) 40 capsule 0  . clarithromycin (BIAXIN) 500 MG tablet Take 1 tablet (500 mg total) by mouth 2 (two) times daily. (Patient not taking: Reported on 10/30/2017) 20 tablet 0  . omeprazole (PRILOSEC) 20 MG capsule Take 1 capsule (20 mg total) by mouth 2 (two) times daily before a meal. (Patient not taking: Reported on 10/30/2017) 30 capsule 1  . ranitidine (ZANTAC) 300 MG tablet Take 1 tablet (300 mg total) by mouth at bedtime as needed for heartburn. (Patient not taking: Reported on 10/30/2017) 30 tablet 5   No facility-administered medications prior to visit.     No Known Allergies     Objective:    BP 118/72 (BP Location: Right Arm, Patient Position: Sitting, Cuff Size: Normal)   Pulse 64   Temp 98.3 F (36.8 C) (Oral)   Ht 5\' 4"  (1.626 m)   Wt 200 lb 9.6 oz (91 kg)   LMP 10/09/2017   SpO2 98%   BMI 34.43 kg/m  Wt Readings from Last 3 Encounters:  10/30/17 200 lb 9.6 oz (91 kg)  09/13/17 199 lb 3.2 oz (90.4 kg)  05/15/17 202 lb 3.2 oz (91.7 kg)    Physical Exam  Constitutional: She is oriented to person, place, and time. She appears well-developed and well-nourished. She is cooperative.  HENT:  Head: Normocephalic and atraumatic.  Eyes: EOM are normal.  Neck: Normal range of motion. No thyromegaly present.  Cardiovascular: Normal rate, regular rhythm, normal heart sounds and intact distal pulses. Exam reveals no gallop and  no friction rub.  No murmur heard. Pulmonary/Chest: Effort normal and breath sounds normal. No tachypnea. No respiratory distress. She has no decreased breath sounds. She has no wheezes. She has no rhonchi. She has no rales. She exhibits no tenderness.  Abdominal: Soft. Bowel sounds are normal.  Musculoskeletal: Normal range of motion. She exhibits no edema.  Lymphadenopathy:    She has no cervical adenopathy.  Neurological: She is alert and oriented to person, place, and time. Coordination normal.  Skin: Skin is warm and dry.  Psychiatric: She has a normal mood and affect. Her behavior is normal. Judgment and thought content normal.  Nursing note and vitals reviewed.      Patient has been counseled extensively about nutrition and exercise as well as the importance of adherence with medications and regular follow-up. The patient was given clear instructions to go to ER or  return to medical center if symptoms don't improve, worsen or new problems develop. The patient verbalized understanding.   Follow-up: Return if symptoms worsen or fail to improve.   Gildardo Pounds, FNP-BC [facility] and [place], [place]   10/30/2017, 12:47 PM

## 2017-11-01 ENCOUNTER — Encounter (HOSPITAL_COMMUNITY): Payer: Self-pay

## 2017-11-01 ENCOUNTER — Ambulatory Visit
Admission: RE | Admit: 2017-11-01 | Discharge: 2017-11-01 | Disposition: A | Discharge status: Home / Self Care | Payer: No Typology Code available for payment source | Source: Ambulatory Visit | Attending: Obstetrics and Gynecology | Admitting: Obstetrics and Gynecology

## 2017-11-01 ENCOUNTER — Ambulatory Visit (HOSPITAL_COMMUNITY)
Admission: RE | Admit: 2017-11-01 | Discharge: 2017-11-01 | Disposition: A | Discharge status: Home / Self Care | Payer: Self-pay | Source: Ambulatory Visit | Attending: Obstetrics and Gynecology | Admitting: Obstetrics and Gynecology

## 2017-11-01 VITALS — BP 122/78 | Ht 63.75 in | Wt 198.4 lb

## 2017-11-01 DIAGNOSIS — Z1231 Encounter for screening mammogram for malignant neoplasm of breast: Secondary | ICD-10-CM

## 2017-11-01 DIAGNOSIS — Z1239 Encounter for other screening for malignant neoplasm of breast: Secondary | ICD-10-CM

## 2017-11-01 NOTE — Progress Notes (Signed)
No complaints today.   Pap Smear: Pap smear not completed today. Last Pap smear was 04/14/2016 at [facility] and normal with negative HPV. Per patient has no history of an abnormal Pap smear. Last Pap smear result is in Epic.  Physical exam: Breasts Breasts symmetrical. No skin abnormalities bilateral breasts. No nipple retraction bilateral breasts. No nipple discharge bilateral breasts. No lymphadenopathy. No lumps palpated bilateral breasts. No complaints of pain or tenderness on exam. Referred patient to the [facility] for a screening mammogram. Appointment scheduled for Thursday, November 01, 2017 at 1040.        Pelvic/Bimanual No Pap smear completed today since last Pap smear and HPV typing was 04/14/2016. Pap smear not indicated per BCCCP guidelines.   Smoking History: Patient has never smoked.  Patient Navigation: Patient education provided. Access to services provided for patient through [facility] program. Spanish interpreter provided.  Used Spanish interpreter [employer] from [place].

## 2017-11-01 NOTE — Patient Instructions (Signed)
Explained breast self awareness with [name]. Patient did not need a Pap smear today due to last Pap smear and HPV typing was 04/14/2016. Let her know BCCCP will cover Pap smears and HPV typing every 5 years unless has a history of abnormal Pap smears. Referred patient to the [facility] for a screening mammogram. Appointment scheduled for Thursday, November 01, 2017 at 1040. Let patient know the [facility] will follow up with her within the next couple weeks with results of mammogram by letter or phone. Marissa Jimenez verbalized understanding.  Marissa Jimenez, Arvil Chaco, RN 11:59 AM

## 2017-11-02 ENCOUNTER — Encounter (HOSPITAL_COMMUNITY): Payer: Self-pay | Admitting: *Deleted

## 2017-11-29 ENCOUNTER — Ambulatory Visit (INDEPENDENT_AMBULATORY_CARE_PROVIDER_SITE_OTHER): Payer: Self-pay | Admitting: Otolaryngology

## 2017-11-29 DIAGNOSIS — J342 Deviated nasal septum: Secondary | ICD-10-CM

## 2017-11-29 DIAGNOSIS — J343 Hypertrophy of nasal turbinates: Secondary | ICD-10-CM

## 2017-11-29 DIAGNOSIS — K219 Gastro-esophageal reflux disease without esophagitis: Secondary | ICD-10-CM

## 2017-11-29 DIAGNOSIS — H9313 Tinnitus, bilateral: Secondary | ICD-10-CM

## 2017-11-30 MED FILL — ?OMEPRAZOLE DR 20MG CAPSULE: 20 | 30 days supply | Qty: 30 | Fill #0

## 2017-12-27 MED FILL — OMEPRAZOLE DR 20 MG CAPSULE: 20 | 15 days supply | Qty: 30 | Fill #1

## 2018-01-10 ENCOUNTER — Ambulatory Visit (INDEPENDENT_AMBULATORY_CARE_PROVIDER_SITE_OTHER): Payer: Self-pay | Admitting: Otolaryngology

## 2018-01-10 DIAGNOSIS — R070 Pain in throat: Secondary | ICD-10-CM

## 2018-01-10 DIAGNOSIS — K219 Gastro-esophageal reflux disease without esophagitis: Secondary | ICD-10-CM

## 2018-01-28 ENCOUNTER — Ambulatory Visit: Payer: Self-pay | Attending: Nurse Practitioner | Admitting: Nurse Practitioner

## 2018-01-28 ENCOUNTER — Encounter: Payer: Self-pay | Admitting: Nurse Practitioner

## 2018-01-28 VITALS — BP 123/81 | HR 66 | Temp 98.8°F | Ht 64.0 in | Wt 201.2 lb

## 2018-01-28 DIAGNOSIS — Z7951 Long term (current) use of inhaled steroids: Secondary | ICD-10-CM | POA: Insufficient documentation

## 2018-01-28 DIAGNOSIS — Z833 Family history of diabetes mellitus: Secondary | ICD-10-CM | POA: Insufficient documentation

## 2018-01-28 DIAGNOSIS — Z0000 Encounter for general adult medical examination without abnormal findings: Secondary | ICD-10-CM

## 2018-01-28 DIAGNOSIS — Z79899 Other long term (current) drug therapy: Secondary | ICD-10-CM | POA: Insufficient documentation

## 2018-01-28 DIAGNOSIS — Z8261 Family history of arthritis: Secondary | ICD-10-CM | POA: Insufficient documentation

## 2018-01-28 DIAGNOSIS — R7303 Prediabetes: Secondary | ICD-10-CM | POA: Insufficient documentation

## 2018-01-28 DIAGNOSIS — M1990 Unspecified osteoarthritis, unspecified site: Secondary | ICD-10-CM | POA: Insufficient documentation

## 2018-01-28 LAB — GLUCOSE, POCT (MANUAL RESULT ENTRY): POC Glucose: 132 mg/dl — AB (ref 70–99)

## 2018-01-28 MED FILL — OMEPRAZOLE 20 MG CAP: 20 | 30 days supply | Qty: 30 | Fill #0

## 2018-01-28 NOTE — Patient Instructions (Addendum)
GEL inserts for foot support.   ACE wrap for ankle support.  Prediabetes (Prediabetes) QU ES LA PREDIABETES? La prediabetes es la enfermedad que presenta un nivel de azcar en la sangre (glucemia) ms alto de lo normal, pero no lo suficientemente alto como para que le diagnostiquen diabetes tipo2. El hecho de ser prediabtico lo pone en riesgo de desarrollar diabetes tipo2 (diabetes mellitus tipo2). La prediabetes tambin se puede llamar intolerancia a la glucosa o glucosa alterada en ayunas. Generalmente, la prediabetes no causa sntomas. El mdico puede diagnosticar esta enfermedad por los anlisis de [place]. Los anlisis para Hydrographic surveyor la prediabetes se pueden realizar si usted tiene sobrepeso y si presenta al menos un factor de riesgo ms de prediabetes. Entre los factores de riesgo de prediabetes, se incluyen los siguientes:  Best boy un familiar con diabetes tipo2.  Sobrepeso u obesidad.  Tener ms de 21 aos.  Ser descendiente de indgenas norteamericanos, afroamericanos, hispanos o latinos, o asiticos o isleos del Pacfico.  Tener un estilo de vida inactivo (sedentario).  Tener antecedentes de diabetes gestacional o sndrome de ovario poliqustico (SOP).  Tener niveles bajos del colesterol bueno (HDL-C) o niveles altos de grasas en la sangre (triglicridos).  Tener hipertensin arterial. QU ES LA GLUCEMIA Y CMO SE MIDE? La glucemia hace referencia a la cantidad de glucosa que tiene en el torrente sanguneo. La glucosa proviene de los alimentos que contienen azcar y almidn (carbohidratos) que el organismo descompone para formar glucosa. El nivel de glucemia se puede medir en mg/dl (miligramos por decilitro) o mmol/l (milimoles por litro).La glucemia puede controlarse con uno o ms de los siguientes anlisis de sangre:  Medicin de la glucemia en [place]. No se le permitir comer (tendr que [employer]) durante al menos 8horas antes de que se tome una [place] de  [place]. ? Un rango normal de glucemia en ayunas es de 70 a 100mg /dl (de 3,9 a 5,62mmol/l).  Un anlisis de sangre de A1c (hemoglobina A1c). Este anlisis proporciona informacin sobre el control de la glucemia durante los ltimos 2 o 69meses.  Prueba de tolerancia a la glucosa oral (PTGO). Esta prueba mide la glucemia dos veces: ? Despus del ayuno. Este es el valor inicial. ? Dos horas despus de ingerir una bebida que contiene glucosa. Pueden diagnosticarle prediabetes en los siguientes casos:  Si la glucemia en ayunas es de 100 a 125mg /dl (de 5,6 a 6,90mmol/l).  Si el nivel de A1c es del 5,7% al 6,4%.  Si el resultado de la PTGO es de 140 a 199mg /dl (de 7,8 a 47mmol/l). Estos anlisis de sangre se pueden repetir para Physicist, medical diagnstico. QU SUCEDE SI LA GLUCEMIA ES DEMASIADO ALTA? El pncreas produce una hormona (insulina) que ayuda a Civil Service fast streamer glucosa desde el torrente sanguneo hacia las clulas. Cuando las clulas no responden de forma [place] a la insulina que el organismo produce (resistencia a la insulina), el exceso de glucosa se acumula en la sangre en vez de dirigirse hacia las clulas. Como consecuencia, se puede desarrollar glucemia alta (hiperglucemia), que puede causar muchas complicaciones. Este es uno de los sntomas de la prediabetes. QU PUEDE SUCEDER SI LA GLUCEMIA PERMANECE MS ALTA DE LO NORMAL DURANTE MUCHO TIEMPO? Es peligroso Systems analyst glucemia alta durante mucho tiempo. Demasiada glucosa en la sangre puede daar los nervios y los vasos sanguneos. El dao a largo plazo puede provocar complicaciones de la diabetes, por ejemplo:  Cardiopata.  Ictus.  Ceguera.  Enfermedad renal.  Depresin.  Mala circulacin en los pies  y en las piernas, que podra llevar a la extraccin quirrgica (amputacin) en casos graves. CMO SE PUEDE EVITAR QUE LA PREDIABETES SE CONVIERTA EN DIABETES TIPO2? Para prevenir la diabetes tipo2, tome las siguientes  medidas:  Haga actividad fsica. ? Haga actividad fsica de intensidad moderada durante al menos 73minutos como mnimo 5das por [employer] Corporation, o tanto como le haya indicado el mdico. Podra hacer caminatas dinmicas, ciclismo o [place]. ? Pregntele al mdico qu actividades son seguras para usted. Una combinacin de actividades puede ser la mejor opcin, por ejemplo, caminar, practicar natacin, andar en bicicleta y hacer entrenamiento de fuerza.  Baje de [employer] se lo haya indicado el mdico. ? Bajar entre el 5% y el 7% del peso corporal puede revertir la resistencia a la insulina. ? El mdico puede determinar cuntos kilos tiene que bajar y [place] a que adelgace de Geographical information systems officer segura.  Siga un plan de alimentacin saludable. Este incluye consumir protenas magras, hidratos de carbono complejos, frutas y verduras frescas, productos lcteos con bajo contenido de grasa y grasas saludables. ? Siga las indicaciones del mdico respecto de las restricciones para las comidas o las bebidas. ? Programe una cita con un especialista en alimentacin y nutricin (nutricionista certificado) para que lo ayude a Manufacturing engineer plan de alimentacin saludable adecuado para usted.  No fume ni consuma ningn producto que contenga tabaco, lo que incluye cigarrillos, tabaco de Higher education careers adviser y Psychologist, sport and exercise. Si necesita ayuda para dejar de fumar, consulte al [employer].  [employer] de venta libre y los recetados como se lo haya indicado el mdico. Es posible que le receten medicamentos que ayuden a disminuir el riesgo de tener diabetes tipo2. Esta informacin no tiene Marine scientist el consejo del mdico. Asegrese de hacerle al mdico cualquier pregunta que tenga. Document Released: 11/16/2015 Document Revised: 11/16/2015 Document Reviewed: 11/16/2015 Elsevier Interactive Patient Education  2018 [place] de alimentacin para la prediabetes (Prediabetes Eating Plan) La prediabetes,  tambin llamada intolerancia a la glucosa o alteracin de la glucosa en ayunas, es una afeccin que eleva los niveles de azcar en la sangre (glucemia) por encima de lo normal. Seguir una dieta saludable puede ayudar a mantener la prediabetes bajo control, y tambin reduce el riesgo de tener diabetes tipo2 y cardiopata, que es ms alto en las personas que tienen esta afeccin. Junto con la actividad fsica habitual, una dieta saludable:  Promueve la prdida de [place].  Ayuda a Environmental consultant de Dispensing optician.  Ayuda a mejorar la forma en que el organismo [place] la insulina. QU DEBO SABER ACERCA DE ESTE PLAN DE [place]?  Use el ndice glucmico (IG) para planificar las comidas. El ndice le informa con qu rapidez un alimento elevar su nivel de azcar en la sangre. Elija los alimentos con bajo IG. Estos tardan ms tiempo en subir el nivel de azcar en la sangre.  Preste mucha atencin a la cantidad de hidratos de carbono que hay en los alimentos que consume. Los hidratos de carbono [employer] niveles de Dispensing optician.  Lleve un registro de la cantidad de caloras que ingiere. Ingerir la cantidad correcta de caloras lo ayudar a Development worker, international aid peso saludable. Bajar alrededor del 7por ciento del peso inicial puede ayudar a Product/process development scientist la diabetes tipo2.  Tal vez deba seguir Web designer. Esta incluye una gran cantidad de verduras, carnes magras o pescado, cereales integrales, frutas, as como aceites y grasas saludables.  QU ALIMENTOS PUEDO COMER? Cereales Cereales  integrales, como panes, galletas, cereales y pastas de salvado o integrales. Avena sin azcar. Trigo burgol. Cebada. Quinua. Arroz integral. Tortillas o tacos de harina de maz o de salvado. [place] Valeda Malm. Espinaca. Guisantes. Remolachas. Coliflor. Repollo. Brcoli. Zanahorias. Tomates. [place]. [place]in Coupe. Hierbas. Pimientos. Cebollas. Pepinos. Repollitos de Bruselas. Frutas Frutos rojos. Bananas.  Manzanas. Naranjas. Uvas. Papaya. Mango. [place]. Kiwi. Pomelo. Cerezas. Carnes y otras fuentes de protenas Mariscos. Carnes [place], entre ellas, pollo y [place] o cortes magros de carne de cerdo y de [place]. Tofu. Huevos. Los frutos secos. Frijoles. Lcteos Productos lcteos descremados o semidescremados, como yogur, queso cottage y [place]. [employer]. T. Caf. Gaseosas sin azcar o dietticas. Agua de [place]. Leche. Productos alternativos [place], como leche de soja o de [place]. Condimentos [place]. Salsa de pepinillos. [place] con bajo contenido de [place] y de Location manager. Salsa barbacoa con bajo contenido de grasa y de azcar. [place]onesa sin grasa o con bajo contenido de [place]. Dulces y postres [place] sin azcar o con bajo contenido de [employer]. Helados y otros dulces congelados sin azcar o con bajo contenido de [place]. Grasas y Medical laboratory scientific officer. Nueces. Aceite de oliva. Los artculos mencionados arriba pueden no ser [employer] de las bebidas o los alimentos recomendados. Comunquese con el nutricionista para conocer ms opciones. QU ALIMENTOS NO SE RECOMIENDAN? Cereales Productos a base de [place] y de [place], como panes, pastas, bocadillos y cereales. Bebidas Bebidas azucaradas, como t helado y gaseosas con Location manager. Dulces y postres Productos de [place], [place] tortas, [place], [place], Museum/gallery exhibitions officer y tarta de [place]. Los artculos mencionados arriba pueden no ser [employer] de las bebidas y los alimentos que se Higher education careers adviser. Comunquese con el nutricionista para obtener ms informacin. Esta informacin no tiene Marine scientist el consejo del mdico. Asegrese de hacerle al mdico cualquier pregunta que tenga. Document Released: 06/16/2015 Document Revised: 06/16/2015 Document Reviewed: 10/21/2014 Elsevier Interactive Patient Education  2017 [place] de la diabetes mellitus tipo 2 Preventing Type 2 Diabetes Mellitus La diabetes tipo 2  (diabetes mellitus tipo 2) es una enfermedad a Production designer, theatre/television/film plazo (crnica) que afecta los niveles de azcar en la sangre (glucosa). Normalmente, una hormona denominada insulina permite el ingreso de la glucosa en las clulas del cuerpo. Las clulas usan la glucosa para Dealer. En la diabetes tipo 2, puede presentarse uno de los siguientes problemas, o ambos:  El cuerpo no produce la cantidad suficiente de insulina.  El cuerpo no responde de [place] a la insulina que produce (resistencia a la insulina).  La resistencia a la insulina o la falta de insulina hacen que el exceso de glucosa se acumule en la sangre, en lugar de ir a las clulas. Como resultado, se produce un nivel alto de glucemia (hiperglucemia), lo cual puede causar muchas complicaciones. Al tener sobreseo o ser obeso y tener un estilo de vida inactivo (sedentario) puede aumentar su riesgo de padecer diabetes. La diabetes tipo 2 se puede retrasar o prevenir al realizar determinados cambios en la alimentacin y el estilo de vida. Qu cambios se pueden Engineer, structural?  Consuma comidas y refrigerios saludables con regularidad. Tenga a mano un refrigerio saludable cuando sienta [employer], tal como una fruta o un puado de nueces.  Como carnes [place] y protenas con bajo contenido de grasas saturadas como pollo, pescado, claras de huevo y frijoles. Evite las carnes procesadas.  Coma muchas frutas y verduras, y muchos granos no procesados (granos enteros). Se recomienda  que coma: ? 1 a 2 tazas de J. [employer]ney. ? 2 a 3 tazas de [employer]. ? 6 a 8 onzas de granos enteros todos los das, como avena, [place], trigo [place], arroz integral, quinoa y mijo.  Coma productos lcteos de bajo contenido graso, como [place], yogur y [place].  Coma alimentos que contengan grasas saludables, como frutos secos, aguacate, aceite de [place] y aceite de canola.  Beba agua [place]. Evite  los lquidos que contengan azcar agregada como gaseosas o t Alex.  Siga las indicaciones de su mdico respecto de las restricciones especficas para las comidas o las bebidas.  Controle la cantidad de alimentos que come por vez (tamao de la porcin). ? Verifique las etiquetas de los alimentos para verificar los tamaos de las porciones de los alimentos. ? Use una balanza de cocina para pesar las porciones de alimentos.  [place] o hierva al vapor los Building surveyor de frerlos. Cocine con agua o caldo en lugar de aceites o manteca.  Limite su consumo de: ? Sal (sodio). No consuma ms de 1 cucharadita (2,400mg ) de Electrical engineer. Si tiene enfermedad cardaca o presin arterial alta, debe consumir menos de  a  cucharadita (1,500mg ) de sodio por da. ? Grasas saturadas. Se trata de grasa slida a temperatura Sun[employer] o la grasa de la carne. Qu cambios en el estilo de vida se pueden realizar?  Actividad  Haga actividad fsica de intensidad moderada durante al menos 42minutos como mnimo 5das por semana o con la frecuencia que le indique su mdico.  Pregntele al mdico qu actividades son seguras para usted. Una combinacin de actividades puede ser la mejor opcin, por ejemplo, caminar, practicar natacin, andar en bicicleta y hacer entrenamiento de fuerza.  Trate de agregar actividad fsica a su jornada. Por ejemplo: ? Estacione en lugares ms alejados de lo habitual para tener que caminar ms. Por ejemplo, estacione en una equina alejada del estacionamiento cuando vaya a la oficina o a la tienda de comestibles. ? Vaya a caminar durante su hora de almuerzo. ? Utilice las Clinical cytogeneticist de ascensores o escaleras mecnicas. Bajar de peso  [place] de peso segn se le indique. El mdico puede determinar cuntos kilos tiene que bajar y [place] a que adelgace de Geographical information systems officer segura.  Si tiene sobrepeso o es obeso, es posible que se le indique que pierda al menos entre el  5 y el 7% de su Engineer, site. Alcohol y tabaco   Limite el consumo de alcohol a no ms de 1 medida por da si es mujer y no est Music therapist y a 2 medidas por da si es hombre. Una medida equivale a 12onzas de cerveza, 5onzas de vino o 1onzas de bebidas alcohlicas de alta graduacin.  No consuma ningn producto que contenga tabaco, lo que incluye cigarrillos, tabaco de Higher education careers adviser y Psychologist, sport and exercise. Si necesita ayuda para dejar de fumar, consulte al mdico. Trabaje con su mdico  Contrlese la glucemia de [place] regular, segn lo indicado por su mdico.  Analice sus factores de riesgo y cmo puede reducir su riesgo de padecer diabetes.  Somtase a pruebas de Programme researcher, broadcasting/film/video segn lo indicado por su mdico. Puede realizarse pruebas de deteccin con regularidad, especialmente si tiene ciertos factores de riesgo de padecer diabetes tipo 2.  Haga una cita con un especialista en dietas y nutricin (nutricionista matriculado). Un nutricionista matriculado puede ayudarlo a crear un plan de alimentacin saludable y a comprender los  tamaos de las porciones y las etiquetas de los alimentos. Por qu son importantes estos cambios?  Es posible prevenir o Nurse, children's diabetes tipo 2 y los problemas de salud relacionados al realizar cambios en el estilo de vida y Youth worker.  Puede ser difcil reconocer los signos de la diabetes tipo 2. La mejor manera de evitar posibles daos en el cuerpo es tomar medidas para prevenir la enfermedad antes que usted desarrolle los sntomas. Qu puede suceder si no se realizan cambios?  Sus niveles de glucemia pueden continuar aumentando. Tener la glucemia alta durante mucho tiempo es peligroso. Demasiada glucosa en la sangre puede daar los vasos sanguneos, el corazn, los riones, los nervios y los ojos.  Puede desarrollar prediabetes o diabetes tipo 2. La diabetes tipo 2 puede ocasionar muchos problemas de salud crnicos y complicaciones, tales  como: ? Enfermedad cardaca. ? Accidente cerebrovascular. ? Ceguera. ? Enfermedad renal. ? Depresin. ? Circulacin deficiente en los pies y las piernas, lo cual podra ocasionar una extirpacin quirrgica (amputacin) en casos graves. Dnde encontrar 12:  Pida a su mdico que le recomiende un nutricionista matriculado, Radio broadcast assistant en diabetes o un programa para Sports coach de [place].  Busque grupos de apoyo para bajar de peso a nivel local o en lnea.  Asista a un gimnasio, club de acondicionamiento fsico o nase a un grupo que realice actividad fsica al aire libre como un club de caminata. Dnde encontrar ms informacin: Para obtener ms informacin sobre la diabetes y la prevencin de la diabetes, visite:  Asociacin Americana de la Diabetes (American Diabetes Association, ADA): www.diabetes.[place] Diabetes y las Enfermedades Digestivas y [facility] of Diabetes and Digestive and Kidney Diseases): FindSpin.nl  Para obtener ms informacin sobre [employer], visite:  Departamento de Agricultura de los EE.UU., Mi plato (U.S. Department of Agriculture, Engineer, structural, Choose My Plate): http://wiley-williams.com/  Oficina para la Prevencin de Enfermedades y Promocin de Passenger transport manager, Games developer de Designer, multimedia (Office of Disease Prevention and Health Promotion, ODPHP, Dietary Guidelines): SurferLive.at  Resumen  Puede reducir su riesgo de padecer diabetes tipo 2 al aumentar su actividad fsica, comer alimentos saludables y Sports coach de peso segn se lo indiquen.  Hable con su mdico sobre su riesgo de padecer diabetes tipo 2. Pregunte [employer]  de sangre o pruebas de deteccin que debe Dispensing optician. Esta informacin no tiene Marine scientist el consejo del mdico. Asegrese de hacerle al mdico cualquier pregunta que tenga. Document Released: 11/16/2015 Document Revised: 01/15/2017  Document Reviewed: 11/16/2015 Elsevier Interactive Patient Education  2018 [employer].

## 2018-01-28 NOTE — Progress Notes (Signed)
Assessment & Plan:  Marissa Jimenez was seen today for annual exam.  Diagnoses and all orders for this visit:  Well woman exam (no gynecological exam) -     Lipid panel -     CBC  Prediabetes -     Glucose (CBG) -     CMP14+EGFR Continue blood sugar control as discussed in office today, low carbohydrate diet, and regular physical exercise as tolerated, 150 minutes per week (30 min each day, 5 days per week, or 50 min 3 days per week). Keep blood sugar logs with fasting goal of 80-130 mg/dl, post prandial less than 180.  For Hypoglycemia: BS <60 and Hyperglycemia BS >400; contact the clinic ASAP. Annual eye exams and foot exams are recommended.    Patient has been counseled on age-appropriate routine health concerns for screening and prevention. These are reviewed and up-to-date. Referrals have been placed accordingly. Immunizations are up-to-date or declined.    Subjective:   Chief Complaint  Patient presents with  . Annual Exam    Pt. is here for a physical. No pap smear, pap smear done 2017.    HPI Marissa Jimenez 42 y.o. female presents to office today for an annual wellness exam.    Review of Systems  Constitutional: Negative.  Negative for chills, fever, malaise/fatigue and weight loss.  HENT: Negative.  Negative for congestion, hearing loss, sinus pain and sore throat.   Eyes: Negative.  Negative for blurred vision, double vision, photophobia and pain.  Respiratory: Negative.  Negative for cough, sputum production, shortness of breath and wheezing.   Cardiovascular: Negative.  Negative for chest pain and leg swelling.  Gastrointestinal: Negative.  Negative for abdominal pain, constipation, diarrhea, heartburn, nausea and vomiting.  Genitourinary: Negative.   Musculoskeletal: Negative.  Negative for joint pain and myalgias.  Skin: Negative.  Negative for rash.  Neurological: Positive for headaches. Negative for dizziness, tremors, speech change, focal weakness and seizures.   Endo/Heme/Allergies: Positive for environmental allergies.  Psychiatric/Behavioral: Negative.  Negative for depression and suicidal ideas. The patient is not nervous/anxious and does not have insomnia.     Past Medical History:  Diagnosis Date  . Arthritis 2012  . Headache 2013   . Subacromial bursitis 08/04/2013    Past Surgical History:  Procedure Laterality Date  . CESAREAN SECTION     two c-sections 2002, 2008  . TUBAL LIGATION      Family History  Problem Relation Age of Onset  . Hyperlipidemia Mother   . Arthritis Mother   . Diabetes Father   . Diabetes Sister   . Alcohol abuse Brother   . Cancer Neg Hx     Social History Reviewed with no changes to be made today.   Outpatient Medications Prior to Visit  Medication Sig Dispense Refill  . fluticasone (FLONASE) 50 MCG/ACT nasal spray Place 2 sprays into both nostrils daily. 16 g 6  . omeprazole (PRILOSEC) 20 MG capsule Take 20 mg by mouth 2 (two) times daily.    . cyclobenzaprine (FLEXERIL) 10 MG tablet Take 1 tablet (10 mg total) by mouth 3 (three) times daily as needed for muscle spasms. (Patient not taking: Reported on 11/01/2017) 30 tablet 1   No facility-administered medications prior to visit.     No Known Allergies     Objective:    BP 123/81 (BP Location: Left Arm, Patient Position: Sitting, Cuff Size: Large)   Pulse 66   Temp 98.8 F (37.1 C) (Oral)   Ht _0  (1.626  m)   Wt 201 lb 3.2 oz (91.3 kg)   SpO2 99%   BMI 34.54 kg/m  Wt Readings from Last 3 Encounters:  01/28/18 201 lb 3.2 oz (91.3 kg)  11/01/17 198 lb 6.4 oz (90 kg)  10/30/17 200 lb 9.6 oz (91 kg)    Physical Exam  Constitutional: She is oriented to person, place, and time. She appears well-developed and well-nourished.  HENT:  Head: Normocephalic and atraumatic.  Right Ear: External ear normal.  Left Ear: External ear normal.  Nose: Nose normal.  Mouth/Throat: Oropharynx is clear and moist. No oropharyngeal exudate.  Eyes:  Pupils are equal, round, and reactive to light. Conjunctivae and EOM are normal. Right eye exhibits no discharge. No scleral icterus.  Neck: Normal range of motion. Neck supple. No tracheal deviation present. No thyromegaly present.  Cardiovascular: Normal rate, regular rhythm, normal heart sounds and intact distal pulses. Exam reveals no friction rub.  No murmur heard. Pulmonary/Chest: Effort normal and breath sounds normal. No accessory muscle usage. No respiratory distress. She has no decreased breath sounds. She has no wheezes. She has no rhonchi. She has no rales. She exhibits no tenderness. Right breast exhibits no inverted nipple, no mass, no nipple discharge, no skin change and no tenderness. Left breast exhibits no inverted nipple, no mass, no nipple discharge, no skin change and no tenderness. Breasts are symmetrical.  Abdominal: Soft. Bowel sounds are normal. She exhibits no distension and no mass. There is no tenderness. There is no rebound and no guarding.  Musculoskeletal: Normal range of motion. She exhibits no edema, tenderness or deformity.  Lymphadenopathy:    She has no cervical adenopathy.  Neurological: She is alert and oriented to person, place, and time. She has normal reflexes. No cranial nerve deficit. Coordination normal.  Skin: Skin is warm and dry. No erythema.  Psychiatric: She has a normal mood and affect. Her speech is normal and behavior is normal. Judgment and thought content normal.       Patient has been counseled extensively about nutrition and exercise as well as the importance of adherence with medications and regular follow-up. The patient was given clear instructions to go to ER or return to medical center if symptoms don't improve, worsen or new problems develop. The patient verbalized understanding.   Follow-up: Return in about 3 months (around 04/29/2018) for prediabetes.   Gildardo Pounds, FNP-BC [facility] [place], [place]   01/28/2018, 10:11 AM

## 2018-01-29 LAB — CBC
HEMATOCRIT: 39.6 % (ref 34.0–46.6)
HEMOGLOBIN: 13.4 g/dL (ref 11.1–15.9)
MCH: 30.9 pg (ref 26.6–33.0)
MCHC: 33.8 g/dL (ref 31.5–35.7)
MCV: 91 fL (ref 79–97)
Platelets: 337 10*3/uL (ref 150–379)
RBC: 4.34 x10E6/uL (ref 3.77–5.28)
RDW: 14.5 % (ref 12.3–15.4)
WBC: 8.4 10*3/uL (ref 3.4–10.8)

## 2018-01-29 LAB — CMP14+EGFR
A/G RATIO: 1.8 (ref 1.2–2.2)
ALBUMIN: 4.4 g/dL (ref 3.5–5.5)
ALT: 39 IU/L — AB (ref 0–32)
AST: 18 IU/L (ref 0–40)
Alkaline Phosphatase: 51 IU/L (ref 39–117)
BILIRUBIN TOTAL: 0.3 mg/dL (ref 0.0–1.2)
BUN / CREAT RATIO: 21 (ref 9–23)
BUN: 12 mg/dL (ref 6–24)
CHLORIDE: 101 mmol/L (ref 96–106)
CO2: 23 mmol/L (ref 20–29)
Calcium: 9.2 mg/dL (ref 8.7–10.2)
Creatinine, Ser: 0.58 mg/dL (ref 0.57–1.00)
GFR calc non Af Amer: 115 mL/min/{1.73_m2} (ref >59)
GFR, EST AFRICAN AMERICAN: 132 mL/min/{1.73_m2} (ref >59)
Globulin, Total: 2.4 g/dL (ref 1.5–4.5)
Glucose: 132 mg/dL — ABNORMAL HIGH (ref 65–99)
POTASSIUM: 4.9 mmol/L (ref 3.5–5.2)
Sodium: 139 mmol/L (ref 134–144)
TOTAL PROTEIN: 6.8 g/dL (ref 6.0–8.5)

## 2018-01-29 LAB — LIPID PANEL
Chol/HDL Ratio: 2.9 ratio (ref 0.0–4.4)
Cholesterol, Total: 163 mg/dL (ref 100–199)
HDL: 57 mg/dL (ref >39)
LDL Calculated: 91 mg/dL (ref 0–99)
Triglycerides: 75 mg/dL (ref 0–149)
VLDL Cholesterol Cal: 15 mg/dL (ref 5–40)

## 2018-01-30 NOTE — Progress Notes (Signed)
Patient came in the office asking for her lab results. Patient was given her lab results, and explained.

## 2018-02-06 ENCOUNTER — Ambulatory Visit: Payer: No Typology Code available for payment source

## 2018-02-15 ENCOUNTER — Ambulatory Visit: Payer: Self-pay | Attending: Nurse Practitioner

## 2018-04-19 ENCOUNTER — Ambulatory Visit: Payer: Self-pay | Attending: Nurse Practitioner

## 2018-04-25 ENCOUNTER — Ambulatory Visit (INDEPENDENT_AMBULATORY_CARE_PROVIDER_SITE_OTHER): Payer: Self-pay | Admitting: Otolaryngology

## 2018-04-25 DIAGNOSIS — K219 Gastro-esophageal reflux disease without esophagitis: Secondary | ICD-10-CM

## 2018-04-30 ENCOUNTER — Ambulatory Visit: Payer: No Typology Code available for payment source | Admitting: Nurse Practitioner

## 2018-05-06 ENCOUNTER — Ambulatory Visit: Payer: Self-pay

## 2018-05-24 ENCOUNTER — Ambulatory Visit: Payer: Self-pay | Admitting: Nurse Practitioner

## 2018-06-25 ENCOUNTER — Encounter: Payer: Self-pay | Admitting: Nurse Practitioner

## 2018-06-25 ENCOUNTER — Ambulatory Visit: Payer: Self-pay | Attending: Nurse Practitioner | Admitting: Nurse Practitioner

## 2018-06-25 DIAGNOSIS — Z79899 Other long term (current) drug therapy: Secondary | ICD-10-CM | POA: Insufficient documentation

## 2018-06-25 DIAGNOSIS — E119 Type 2 diabetes mellitus without complications: Secondary | ICD-10-CM | POA: Insufficient documentation

## 2018-06-25 DIAGNOSIS — Z833 Family history of diabetes mellitus: Secondary | ICD-10-CM | POA: Insufficient documentation

## 2018-06-25 DIAGNOSIS — Z09 Encounter for follow-up examination after completed treatment for conditions other than malignant neoplasm: Secondary | ICD-10-CM | POA: Insufficient documentation

## 2018-06-25 LAB — GLUCOSE, POCT (MANUAL RESULT ENTRY): POC Glucose: 135 mg/dl — AB (ref 70–99)

## 2018-06-25 LAB — POCT GLYCOSYLATED HEMOGLOBIN (HGB A1C): Hemoglobin A1C: 7.1 % — AB (ref 4.0–5.6)

## 2018-06-25 MED ORDER — GLUCOSE BLOOD VI STRP: ORAL_STRIP | 12 refills | Status: DC

## 2018-06-25 MED ORDER — METFORMIN HCL 500 MG PO TABS: 500.0000 mg | ORAL_TABLET | Freq: Two times a day (BID) | ORAL | 3 refills | Status: DC

## 2018-06-25 MED ORDER — TRUE METRIX METER W/DEVICE KIT: PACK | 0 refills | Status: DC

## 2018-06-25 MED ORDER — TRUEPLUS LANCETS 28G MISC: 3 refills | Status: DC

## 2018-06-25 MED FILL — TRUE METRIX TEST STRIP: 30 days supply | Qty: 100 | Fill #0

## 2018-06-25 MED FILL — ?METFORMIN HCL 500MG TABS: 500 | 30 days supply | Qty: 60 | Fill #0

## 2018-06-25 MED FILL — !TRUE METRIX BLOOD GLUCOSE: 30 days supply | Qty: 1 | Fill #0

## 2018-06-25 MED FILL — TRUEplus LANCETS 28G MISC: 30 days supply | Qty: 100 | Fill #0

## 2018-06-25 NOTE — Patient Instructions (Signed)
Diabetes blood sugar goals  Fasting in AM before breakfast which means at least 8 hrs of no eating or drinking) except water or unsweetened coffee or tea): 90-130 2 hrs after meals: < 130,   Hypoglycemia or low blood sugar: < 70 (You should not have hypoglycemia.)  Aim for 30 minutes of exercise most days. Rethink what you drink. Water is great! Aim for 2-3 Carb Choices per meal (30-45 grams) +/- 1 either way  Aim for 0-15 Carbs per snack if hungry  Include protein in moderation with your meals and snacks  Consider reading food labels for Total Carbohydrate and Fat Grams of foods  Consider checking BG at alternate times per day  Continue taking medication as directed Be mindful about how much sugar you are adding to beverages and other foods. Fruit Punch - find one with no sugar  Measure and decrease portions of carbohydrate foods  Make your plate and don't go back for seconds

## 2018-06-25 NOTE — Progress Notes (Signed)
Assessment & Plan:  Marissa Jimenez was seen today for follow-up.  Diagnoses and all orders for this visit:  Controlled type 2 diabetes mellitus without complication, without long-term current use of insulin (HCC) -     Glucose (CBG) -     HgB A1c -     Blood Glucose Monitoring Suppl (TRUE METRIX METER) w/Device KIT; Use as instructed -     glucose blood (TRUE METRIX BLOOD GLUCOSE TEST) test strip; Check blood glucose levels once a day by fingerstick -     TRUEPLUS LANCETS 28G MISC; Check blood glucose levels once a day by fingerstick -     Microalbumin/Creatinine Ratio, Urine -     metFORMIN (GLUCOPHAGE) 500 MG tablet; Take 1 tablet (500 mg total) by mouth 2 (two) times daily with a meal. -     Ambulatory referral to Ophthalmology    Patient has been counseled on age-appropriate routine health concerns for screening and prevention. These are reviewed and up-to-date. Referrals have been placed accordingly. Immunizations are up-to-date or declined.    Subjective:   Chief Complaint  Patient presents with  . Follow-up    Pt. is here for prediabetes follow-up.    HPI Marissa Jimenez 42 y.o. female presents to office today for follow up to prediabetes. VRI was used to communicate directly with patient for the entire encounter including providing detailed patient instructions.    Diabetes Mellitus Type 2 A1c has increased from 5.5 to 7.1. We discussed diabetes mellitus, complications, ways to control via dietary modification and exercise as well as medication which will be prescribed today.  All questions were answered and patient verbalized understanding by recall. She will be started on metformin 500 mg BID. Will f/u in 3 months. She was instructed to bring her glucometer and log with her. She currently denies any hypo or hyperglycemic symptoms.  Lab Results  Component Value Date   HGBA1C 7.1 (A) 06/25/2018    Review of Systems  Constitutional: Negative for fever, malaise/fatigue and  weight loss.  HENT: Negative.  Negative for nosebleeds.   Eyes: Negative.  Negative for blurred vision, double vision and photophobia.  Respiratory: Negative.  Negative for cough and shortness of breath.   Cardiovascular: Negative.  Negative for chest pain, palpitations and leg swelling.  Gastrointestinal: Negative.  Negative for heartburn, nausea and vomiting.  Musculoskeletal: Negative.  Negative for myalgias.  Neurological: Negative.  Negative for dizziness, focal weakness, seizures and headaches.  Psychiatric/Behavioral: Negative.  Negative for suicidal ideas.    Past Medical History:  Diagnosis Date  . Arthritis 2012  . Headache 2013   . Subacromial bursitis 08/04/2013    Past Surgical History:  Procedure Laterality Date  . CESAREAN SECTION     two c-sections 2002, 2008  . TUBAL LIGATION      Family History  Problem Relation Age of Onset  . Hyperlipidemia Mother   . Arthritis Mother   . Diabetes Father   . Diabetes Sister   . Alcohol abuse Brother   . Cancer Neg Hx     Social History Reviewed with no changes to be made today.   Outpatient Medications Prior to Visit  Medication Sig Dispense Refill  . fluticasone (FLONASE) 50 MCG/ACT nasal spray Place 2 sprays into both nostrils daily. 16 g 6  . omeprazole (PRILOSEC) 20 MG capsule Take 20 mg by mouth 2 (two) times daily.     No facility-administered medications prior to visit.     No Known Allergies  Objective:    BP 118/79 (BP Location: Left Arm, Patient Position: Sitting, Cuff Size: Large)   Pulse 61   Temp 98.9 F (37.2 C) (Oral)   Ht 5' 4"  (1.626 m)   Wt 189 lb (85.7 kg)   LMP 06/15/2018   SpO2 98%   BMI 32.44 kg/m  Wt Readings from Last 3 Encounters:  06/25/18 189 lb (85.7 kg)  01/28/18 201 lb 3.2 oz (91.3 kg)  11/01/17 198 lb 6.4 oz (90 kg)    Physical Exam  Constitutional: She is oriented to person, place, and time. She appears well-developed and well-nourished. She is cooperative.    HENT:  Head: Normocephalic and atraumatic.  Eyes: EOM are normal.  Neck: Normal range of motion.  Cardiovascular: Normal rate, regular rhythm and normal heart sounds. Exam reveals no gallop and no friction rub.  No murmur heard. Pulmonary/Chest: Effort normal and breath sounds normal. No tachypnea. No respiratory distress. She has no decreased breath sounds. She has no wheezes. She has no rhonchi. She has no rales. She exhibits no tenderness.  Abdominal: Soft. Bowel sounds are normal.  Musculoskeletal: Normal range of motion. She exhibits no edema.  Neurological: She is alert and oriented to person, place, and time. Coordination normal.  Skin: Skin is warm and dry.  Psychiatric: She has a normal mood and affect. Her behavior is normal. Judgment and thought content normal.  Nursing note and vitals reviewed.        Patient has been counseled extensively about nutrition and exercise as well as the importance of adherence with medications and regular follow-up. The patient was given clear instructions to go to ER or return to medical center if symptoms don't improve, worsen or new problems develop. The patient verbalized understanding.   Follow-up: Return in about 3 months (around 09/24/2018) for DM.   Gildardo Pounds, FNP-BC [facility] [place], [place]   06/25/2018, 5:40 PM

## 2018-06-26 LAB — MICROALBUMIN / CREATININE URINE RATIO: CREATININE, UR: 47.1 mg/dL

## 2018-06-28 ENCOUNTER — Telehealth: Payer: Self-pay | Admitting: Nurse Practitioner

## 2018-06-28 NOTE — Telephone Encounter (Signed)
Patient called wanting to know her lab results.  Results was inform.  Marissa Jimenez assist with the call.

## 2018-06-28 NOTE — Telephone Encounter (Signed)
-----   Message from Gildardo Pounds, NP sent at 06/28/2018  8:47 AM EDT ----- Labs do not show any microscopic kidney damage at this time.

## 2018-06-28 NOTE — Telephone Encounter (Signed)
Spanish speaking patient called to request her lab results, please follow up when results have been completed

## 2018-07-01 ENCOUNTER — Ambulatory Visit: Payer: Self-pay | Attending: Nurse Practitioner | Admitting: Pharmacist

## 2018-07-01 ENCOUNTER — Encounter: Payer: Self-pay | Admitting: Pharmacist

## 2018-07-01 DIAGNOSIS — E119 Type 2 diabetes mellitus without complications: Secondary | ICD-10-CM | POA: Insufficient documentation

## 2018-07-01 LAB — GLUCOSE, POCT (MANUAL RESULT ENTRY): POC Glucose: 134 mg/dl — AB (ref 70–99)

## 2018-07-01 NOTE — Patient Instructions (Signed)
Gracias por venir a vernos hoy!  La meta de azcar en la sangre en la maana ANTES de comer es 80-130.  La meta de azcar en la sangre 2 horas DESPUS de comer es MENOS de 180.  Si tiene [employer], llmeme. Mi nmero es [phone].

## 2018-07-01 NOTE — Progress Notes (Signed)
Patient was educated on the use of the True Metrix blood glucose meter. Reviewed necessary supplies and operation of the meter. Also reviewed goal blood glucose levels. Patient was able to demonstrate use. All questions and concerns were addressed.   

## 2018-07-23 ENCOUNTER — Encounter: Payer: Self-pay | Admitting: Family Medicine

## 2018-07-23 ENCOUNTER — Ambulatory Visit (INDEPENDENT_AMBULATORY_CARE_PROVIDER_SITE_OTHER): Payer: Self-pay | Admitting: Family Medicine

## 2018-07-23 VITALS — BP 127/78 | HR 64 | Temp 98.4°F | Resp 17 | Ht 64.0 in | Wt 177.4 lb

## 2018-07-23 DIAGNOSIS — N3000 Acute cystitis without hematuria: Secondary | ICD-10-CM

## 2018-07-23 MED ORDER — CEPHALEXIN 500 MG PO CAPS: 500.0000 mg | ORAL_CAPSULE | Freq: Two times a day (BID) | ORAL | 0 refills | Status: AC

## 2018-07-23 NOTE — Patient Instructions (Signed)
Keep upcoming follow-up with Provider. You will be notified of lab result.

## 2018-07-23 NOTE — Progress Notes (Signed)
Patient ID: Marissa Jimenez, female    DOB: 01-Nov-1975, 42 y.o.   MRN: [id number]  PCP: Gildardo Pounds, NP  Chief Complaint  Patient presents with  . Urinary Tract Infection    urgency, scanty urination, pain with urination, lower back pain x 2 days. patient denies hematuria, nausea, vomiting, fever, chills    Subjective:  HPI Marissa Jimenez is a 42 y.o. female presents for evaluation of UTI symptom x 2 days. Symptoms include dysuria, frequency, urgency, and flank pain. Denies fever, chills, nausea, or lower abdominal pain. Patient's last menstrual period was 06/22/2018.  Social History   Socioeconomic History  . Marital status: Married    Spouse name: Not on file  . Number of children: 3  . Years of education: 6  . Highest education level: Not on file  Occupational History  . Occupation: Agricultural engineer   Social Needs  . Financial resource strain: Not on file  . Food insecurity:    Worry: Not on file    Inability: Not on file  . Transportation needs:    Medical: Not on file    Non-medical: Not on file  Tobacco Use  . Smoking status: Never Smoker  . Smokeless tobacco: Never Used  Substance and Sexual Activity  . Alcohol use: No  . Drug use: No  . Sexual activity: Yes    Birth control/protection: Surgical  Lifestyle  . Physical activity:    Days per week: 2 days    Minutes per session: 60 min  . Stress: Not at all  Relationships  . Social connections:    Talks on phone: Twice a week    Gets together: Once a week    Attends religious service: More than 4 times per year    Active member of club or organization: No    Attends meetings of clubs or organizations: Never    Relationship status: Living with partner  . Intimate partner violence:    Fear of current or ex partner: No    Emotionally abused: No    Physically abused: No    Forced sexual activity: No  Other Topics Concern  . Not on file  Social History Narrative   Lives in [place] with husband and  three children. Stay at home mother.   Children age- 65, 76, 13.    From [place].   Lived in [place] for 17 years.     Family History  Problem Relation Age of Onset  . Hyperlipidemia Mother   . Arthritis Mother   . Diabetes Father   . Diabetes Sister   . Alcohol abuse Brother   . Cancer Neg Hx    Review of Systems  Pertinent negatives listed in HPI Patient Active Problem List   Diagnosis Date Noted  . Prediabetes 10/30/2017  . Right medial knee pain 02/20/2017  . Wart of face 02/20/2017  . Nevus of face 02/20/2017  . Gastroesophageal reflux disease with esophagitis 11/24/2016  . Varicose veins of both lower extremities 05/17/2016  . Itching of ear 05/17/2016  . Chiari I malformation ([place]) 01/05/2015  . Vitamin D insufficiency 12/25/2014    No Known Allergies  Prior to Admission medications   Medication Sig Start Date End Date Taking? Authorizing Provider  Blood Glucose Monitoring Suppl (TRUE METRIX METER) w/Device KIT Use as instructed 06/25/18  Yes Gildardo Pounds, NP  fluticasone (FLONASE) 50 MCG/ACT nasal spray Place 2 sprays into both nostrils daily. 10/30/17  Yes Gildardo Pounds, NP  glucose blood (TRUE METRIX  BLOOD GLUCOSE TEST) test strip Check blood glucose levels once a day by fingerstick 06/25/18  Yes Gildardo Pounds, NP  metFORMIN (GLUCOPHAGE) 500 MG tablet Take 1 tablet (500 mg total) by mouth 2 (two) times daily with a meal. 06/25/18  Yes Gildardo Pounds, NP  omeprazole (PRILOSEC) 20 MG capsule Take 20 mg by mouth 2 (two) times daily.   Yes [provider]  TRUEPLUS LANCETS 28G MISC Check blood glucose levels once a day by fingerstick 06/25/18  Yes Gildardo Pounds, NP  cephALEXin (KEFLEX) 500 MG capsule Take 1 capsule (500 mg total) by mouth 2 (two) times daily for 10 days. 07/23/18 08/02/18  Scot Jun, FNP    Past Medical, Surgical Family and Social History reviewed and updated.    Objective:   Today's Vitals   07/23/18 1613  BP:  127/78  Pulse: 64  Resp: 17  Temp: 98.4 F (36.9 C)  TempSrc: Oral  SpO2: 98%  Weight: 177 lb 6.4 oz (80.5 kg)  Height: _0  (1.626 m)    Wt Readings from Last 3 Encounters:  07/23/18 177 lb 6.4 oz (80.5 kg)  06/25/18 189 lb (85.7 kg)  01/28/18 201 lb 3.2 oz (91.3 kg)   Physical Exam General appearance: alert, well developed, well nourished, cooperative and in no distress Head: Normocephalic, without obvious abnormality, atraumatic Respiratory: Respirations even and unlabored, normal respiratory rate Heart: rate and rhythm normal. No gallop or murmurs noted on exam  Abdomen: Positive for bilateral CVA  Extremities: No gross deformities Skin: Skin color, texture, turgor normal. No rashes seen  Psych: Appropriate mood and affect. Neurologic: Mental status: Alert, oriented to person, place, and time, thought content appropriate.  Lab Results  Component Value Date   POCGLU 134 (A) 07/01/2018   POCGLU 135 (A) 06/25/2018   POCGLU 132 (A) 01/28/2018    Lab Results  Component Value Date   HGBA1C 7.1 (A) 06/25/2018     Assessment & Plan:  1. Acute cystitis without hematuria,  Treating based on symptoms for an uncomplicated UTI. Sending out urine specimen for UA and urine culture. Advised upon receipt of results will notify patient if any changes in antibiotic therapy warranted. -Keflex 500 mg twice daily x 10 days.   Meds ordered this encounter  Medications  . cephALEXin (KEFLEX) 500 MG capsule    Sig: Take 1 capsule (500 mg total) by mouth 2 (two) times daily for 10 days.    Dispense:  20 capsule    Refill:  0   -The patient was given clear instructions to go to ER or return to medical center if symptoms do not improve, worsen or new problems develop. The patient verbalized understanding.    Molli Barrows, FNP Primary Care at [facility] [address] [place] [phone]: [phone]

## 2018-07-24 LAB — URINALYSIS
Bilirubin, UA: NEGATIVE
GLUCOSE, UA: NEGATIVE
Ketones, UA: NEGATIVE
Nitrite, UA: POSITIVE — AB
PH UA: 6.5 (ref 5.0–7.5)
PROTEIN UA: NEGATIVE
Specific Gravity, UA: 1.014 (ref 1.005–1.030)
Urobilinogen, Ur: 0.2 mg/dL (ref 0.2–1.0)

## 2018-07-25 MED FILL — ?METFORMIN HCL 500MG TABL: 500 | 30 days supply | Qty: 60 | Fill #1

## 2018-07-26 LAB — URINE CULTURE

## 2018-07-30 NOTE — Progress Notes (Signed)
Called patient using interpreter line. Patient notified of results & recommendations. Expressed understanding.

## 2018-07-31 ENCOUNTER — Ambulatory Visit: Payer: Self-pay | Attending: Nurse Practitioner | Admitting: Pharmacist

## 2018-07-31 ENCOUNTER — Encounter: Payer: Self-pay | Admitting: Pharmacist

## 2018-07-31 DIAGNOSIS — Z7984 Long term (current) use of oral hypoglycemic drugs: Secondary | ICD-10-CM | POA: Insufficient documentation

## 2018-07-31 DIAGNOSIS — E119 Type 2 diabetes mellitus without complications: Secondary | ICD-10-CM | POA: Insufficient documentation

## 2018-07-31 LAB — GLUCOSE, POCT (MANUAL RESULT ENTRY): POC GLUCOSE: 90 mg/dL (ref 70–99)

## 2018-07-31 MED ORDER — METFORMIN HCL 500 MG PO TABS: 1000.0000 mg | ORAL_TABLET | Freq: Two times a day (BID) | ORAL | 2 refills | Status: DC

## 2018-07-31 MED ORDER — ATORVASTATIN CALCIUM 10 MG PO TABS: 10.0000 mg | ORAL_TABLET | Freq: Every day | ORAL | 2 refills | Status: DC

## 2018-07-31 MED FILL — TRUEplus LANCETS 28G MISC: 30 days supply | Qty: 100 | Fill #1

## 2018-07-31 MED FILL — ?ATORVASTATIN 10MG TABLET: 10 | 30 days supply | Qty: 30 | Fill #0

## 2018-07-31 MED FILL — TRUE METRIX TEST STRIP: 30 days supply | Qty: 100 | Fill #1

## 2018-07-31 NOTE — Patient Instructions (Addendum)
Gracias por venir a verme hoy. Por favor haga lo siguiente:  1. Aumente la metformina a 2 tabletas 2 veces al da.  2. Comience atorvastatina 10 mg al da. 3. Contine revisando el azcar en la sangre en casa.  4. Continen haciendo los cambios de estilo de vida que hemos discutido juntos durante nuestra visita. La dieta y el ejercicio juegan un papel importante en la mejora del azcar en la sangre.  5. Seguimiento con Zelda 17/12/19.   Hipoglucemia o bajo nivel de azcar en la sangre:   El nivel bajo de azcar en la sangre puede ocurrir rpidamente y puede convertirse en una emergencia si no se trata de inmediato.   Si bien esto no debera suceder con frecuencia, se puede provocar si omite una comida o no come lo suficiente. Adems, si su insulina u otros medicamentos para la diabetes se dosifican demasiado, esto puede hacer que su nivel de azcar en la sangre baje.   Las seales de advertencia de niveles bajos de azcar en la sangre incluyen: Sentirse tembloroso o mareado Sentirse dbil o cansado Hambre excesiva Sentirse ansioso o molesto Sudando incluso cuando no hace ejercicio.  Qu hacer si tengo un nivel bajo de azcar en la sangre?   Controle su nivel de azcar en la sangre con su medidor. Si es inferior a 70, contine con el paso 2. Tratar con 3-4 tabletas de glucosa o 3 paquetes de azcar regular. Si no estn disponibles, puedes probar un caramelo duro. Otra opcin sera beber 4 onzas de jugo de fruta o 6 onzas de refresco REGULAR. Vuelva a revisar su azcar en 15 minutos. Si todava est por debajo de 70, haga lo que hizo en el paso 2 nuevamente. Si ha vuelto a subir, contine y coma un refrigerio o una comida pequea en este momento.  English translation.  Thank you for coming to see me today. Please do the following:  1. Increase metformin to 2 tablets 2 times a day.+ 2. Start atorvastatin 10 mg daily. 3. Continue checking blood sugars at home.  4. Continue making the  lifestyle changes we've discussed together during our visit. Diet and exercise play a significant role in improving your blood sugars.  5. Follow-up with Zelda 09/24/18.    Hypoglycemia or low blood sugar:   Low blood sugar can happen quickly and may become an emergency if not treated right away.   While this shouldn't happen often, it can be brought upon if you skip a meal or do not eat enough. Also, if your insulin or other diabetes medications are dosed too high, this can cause your blood sugar to go to low.   Warning signs of low blood sugar include: 1. Feeling shaky or dizzy 2. Feeling weak or tired  3. Excessive hunger 4. Feeling anxious or upset  5. Sweating even when you aren't exercising  What to do if I experience low blood sugar? 1. Check your blood sugar with your meter. If lower than 70, proceed to step 2.  2. Treat with 3-4 glucose tablets or 3 packets of regular sugar. If these aren't around, you can try hard candy. Yet another option would be to drink 4 ounces of fruit juice or 6 ounces of REGULAR soda.  3. Re-check your sugar in 15 minutes. If it is still below 70, do what you did in step 2 again. If has come back up, go ahead and eat a snack or small meal at this time.

## 2018-07-31 NOTE — Progress Notes (Signed)
    S:    PCP: Geryl Rankins  No chief complaint on file.  Patient arrives in good spirits.  Presents for diabetes evaluation, education, and management at the request of Zelda. Patient was last seen and referred by Zelda on  06/25/18. I last saw pt on 07/01/18. Meter education was provided.   Patient reports diabetes was diagnosed in Sept, 2019.   Family/Social History:  - FH: DM (father, sister) - Tobacco: never smoker - Alcohol: "once a year at Christmas"  Insurance coverage/medication affordability:  - Self-pay  Patient reports adherence with medications.  Current diabetes medications include:  - metformin 500 mg BID  Patient denies hypoglycemic events.  Patient reported dietary habits:  - reports decreasing carb-heavy foods and monitoring what she eats    Patient-reported exercise habits:  - walks 1 hour in the morning Monday-Friday   Patient reports some polydipsia, polyuria. (Attributes this to increased water intake).  Patient denies neuropathy. Patient reports visual changes. (recently saw ophthalmologist) Patient reports self foot exams.   O:   POCT: 90. Fasting Home fasting CBG: 83 - 145. 3 outliers of 170, 186, and 246  2 hour post-prandial/random CBG: 100 - 167  Lab Results  Component Value Date   HGBA1C 7.1 (A) 06/25/2018   There were no vitals filed for this visit.  Lipid Panel     Component Value Date/Time   CHOL 163 01/28/2018 1034   TRIG 75 01/28/2018 1034   HDL 57 01/28/2018 1034   CHOLHDL 2.9 01/28/2018 1034   CHOLHDL 2.4 04/14/2016 1130   VLDL 12 04/14/2016 1130   LDLCALC 91 01/28/2018 1034   Clinical ASCVD: No   A/P: Diabetes longstanding currently uncontrolled based on A1c of 7.1. She is very close to goal.. Patient is able to verbalize appropriate hypoglycemia management plan. Patient is adherent with medication. -Increased dose of  metformin to 500 mg 2 tablets (1000mg ) BID. -Extensively discussed pathophysiology of DM, recommended  lifestyle interventions, dietary effects on glycemic control -Counseled on s/sx of and management of hypoglycemia -Next A1C anticipated 09/2018.   ASCVD risk - primary prevention in patient with DM. Last LDL is not controlled. ASCVD risk score is not >20%  - moderate intensity statin indicated. -Started atorvastatin 10 mg.  -Recommend lipid panel in 4-12 weeks for reassessment   Written patient instructions provided.  Total time in face to face counseling 30 minutes.   Follow up PCP Clinic Visit 09/24/18.     Patient seen with:  Dixon Boos, PharmD Candidate [employer] 2021  Benard Halsted, PharmD, [place] [phone]

## 2018-08-01 LAB — CMP14+EGFR
ALBUMIN: 4.4 g/dL (ref 3.5–5.5)
ALT: 37 IU/L — ABNORMAL HIGH (ref 0–32)
AST: 23 IU/L (ref 0–40)
Albumin/Globulin Ratio: 1.7 (ref 1.2–2.2)
Alkaline Phosphatase: 49 IU/L (ref 39–117)
BUN / CREAT RATIO: 16 (ref 9–23)
BUN: 10 mg/dL (ref 6–24)
Bilirubin Total: 0.6 mg/dL (ref 0.0–1.2)
CALCIUM: 9.1 mg/dL (ref 8.7–10.2)
CO2: 23 mmol/L (ref 20–29)
CREATININE: 0.64 mg/dL (ref 0.57–1.00)
Chloride: 100 mmol/L (ref 96–106)
GFR, EST AFRICAN AMERICAN: 127 mL/min/{1.73_m2} (ref >59)
GFR, EST NON AFRICAN AMERICAN: 110 mL/min/{1.73_m2} (ref >59)
GLOBULIN, TOTAL: 2.6 g/dL (ref 1.5–4.5)
Glucose: 94 mg/dL (ref 65–99)
POTASSIUM: 4.4 mmol/L (ref 3.5–5.2)
SODIUM: 139 mmol/L (ref 134–144)
Total Protein: 7 g/dL (ref 6.0–8.5)

## 2018-08-02 ENCOUNTER — Telehealth: Payer: Self-pay | Admitting: Nurse Practitioner

## 2018-08-02 NOTE — Telephone Encounter (Signed)
Patient called requesting an appointment for headaches. Patient was advised to come Monday 10/28 in the morning and wait until one of the providers has a no show. Patient stated she would come Monday morning.

## 2018-08-07 ENCOUNTER — Telehealth: Payer: Self-pay

## 2018-08-07 NOTE — Telephone Encounter (Signed)
Patient was called and informed of lab results. 

## 2018-08-07 NOTE — Telephone Encounter (Signed)
-----   Message from Enobong Newlin, MD sent at 08/01/2018  8:27 AM EDT ----- Please inform the patient that labs are normal. Thank you. 

## 2018-08-08 ENCOUNTER — Ambulatory Visit: Payer: Self-pay | Attending: Nurse Practitioner | Admitting: Physician Assistant

## 2018-08-08 VITALS — BP 122/78 | HR 82 | Temp 98.5°F | Ht 64.0 in | Wt 172.6 lb

## 2018-08-08 DIAGNOSIS — G935 Compression of brain: Secondary | ICD-10-CM | POA: Insufficient documentation

## 2018-08-08 DIAGNOSIS — E119 Type 2 diabetes mellitus without complications: Secondary | ICD-10-CM | POA: Insufficient documentation

## 2018-08-08 DIAGNOSIS — R110 Nausea: Secondary | ICD-10-CM | POA: Insufficient documentation

## 2018-08-08 DIAGNOSIS — Z79899 Other long term (current) drug therapy: Secondary | ICD-10-CM | POA: Insufficient documentation

## 2018-08-08 DIAGNOSIS — Z789 Other specified health status: Secondary | ICD-10-CM

## 2018-08-08 DIAGNOSIS — M1990 Unspecified osteoarthritis, unspecified site: Secondary | ICD-10-CM | POA: Insufficient documentation

## 2018-08-08 DIAGNOSIS — G44209 Tension-type headache, unspecified, not intractable: Secondary | ICD-10-CM | POA: Insufficient documentation

## 2018-08-08 DIAGNOSIS — Z7984 Long term (current) use of oral hypoglycemic drugs: Secondary | ICD-10-CM | POA: Insufficient documentation

## 2018-08-08 LAB — GLUCOSE, POCT (MANUAL RESULT ENTRY): POC Glucose: 100 mg/dl — AB (ref 70–99)

## 2018-08-08 MED ORDER — METHOCARBAMOL 500 MG PO TABS: 500.0000 mg | ORAL_TABLET | Freq: Three times a day (TID) | ORAL | 0 refills | Status: DC | PRN

## 2018-08-08 MED ORDER — KETOROLAC TROMETHAMINE 60 MG/2ML IM SOLN
60.0000 mg | Freq: Once | INTRAMUSCULAR | Status: AC
  Administered 2018-08-08: 60 mg via INTRAMUSCULAR

## 2018-08-08 MED ORDER — ONDANSETRON HCL 8 MG PO TABS: 8.0000 mg | ORAL_TABLET | Freq: Three times a day (TID) | ORAL | 0 refills | Status: DC | PRN

## 2018-08-08 MED FILL — ONDANSETRON HCL 8 MG TABLET: 8 | 6 days supply | Qty: 20 | Fill #0

## 2018-08-08 MED FILL — METHOCARBAMOL 500 MG TABS: 500 | 30 days supply | Qty: 90 | Fill #0

## 2018-08-08 NOTE — Progress Notes (Signed)
Patient ID: Marissa Jimenez, female   DOB: 15-Sep-1976, 42 y.o.   MRN: [id number]      Marissa Jimenez, is a 42 y.o. female  AQT:[id number]  TGY:[id number]  DOB - 1976-04-24  Subjective:  Chief Complaint and HPI: Marissa Jimenez is a 42 y.o. female here with HA. HA is in back of head and neck.  Tylenol helped some.  She has h/o type 1 Chiari malformation.  She denies any issues with balance.  No vision changes.  No neurological deficits.  Some nausea.  No vomiting.  She has had HA like this in the past.    Adriana with [employer] interpreters translating  Reviewed previous MRI/CT head  ROS:   Constitutional:  No f/c, No night sweats, No unexplained weight loss. EENT:  No vision changes, No blurry vision, No hearing changes. No mouth, throat, or ear problems.  Respiratory: No cough, No SOB Cardiac: No CP, no palpitations GI:  No abd pain, No N/V/D. GU: No Urinary s/sx Musculoskeletal: No joint pain Neuro: + headache, no dizziness, no motor weakness.  Skin: No rash Endocrine:  No polydipsia. No polyuria.  Psych: Denies SI/HI  No problems updated.  ALLERGIES: No Known Allergies  PAST MEDICAL HISTORY: Past Medical History:  Diagnosis Date  . Arthritis 2012  . Headache 2013   . Subacromial bursitis 08/04/2013    MEDICATIONS AT HOME: Prior to Admission medications   Medication Sig Start Date End Date Taking? Authorizing Provider  atorvastatin (LIPITOR) 10 MG tablet Take 1 tablet (10 mg total) by mouth daily. 07/31/18  Yes Charlott Rakes, MD  Blood Glucose Monitoring Suppl (TRUE METRIX METER) w/Device KIT Use as instructed 06/25/18  Yes Gildardo Pounds, NP  fluticasone (FLONASE) 50 MCG/ACT nasal spray Place 2 sprays into both nostrils daily. 10/30/17  Yes Gildardo Pounds, NP  glucose blood (TRUE METRIX BLOOD GLUCOSE TEST) test strip Check blood glucose levels once a day by fingerstick 06/25/18  Yes Gildardo Pounds, NP  metFORMIN (GLUCOPHAGE) 500 MG tablet Take 2  tablets (1,000 mg total) by mouth 2 (two) times daily with a meal. 07/31/18  Yes Newlin, Enobong, MD  omeprazole (PRILOSEC) 20 MG capsule Take 20 mg by mouth 2 (two) times daily.   Yes [provider]  TRUEPLUS LANCETS 28G MISC Check blood glucose levels once a day by fingerstick 06/25/18  Yes Gildardo Pounds, NP     Objective:  EXAM:   Vitals:   08/08/18 0858  BP: (!) 145/81  Pulse: 76  Temp: 98.5 F (36.9 C)  TempSrc: Oral  SpO2: 98%  Weight: 172 lb 9.6 oz (78.3 kg)  Height: _0  (1.626 m)    General appearance : A&OX3. NAD. Non-toxic-appearing HEENT: Atraumatic and Normocephalic.  PERRLA. EOM intact.   Neck: supple, no JVD. No cervical lymphadenopathy. No thyromegaly Chest/Lungs:  Breathing-non-labored, Good air entry bilaterally, breath sounds normal without rales, rhonchi, or wheezing  CVS: S1 S2 regular, no murmurs, gallops, rubs  Extremities: Bilateral Lower Ext shows no edema, both legs are warm to touch with = pulse throughout +trapezius spasm in neck B.   Neurology:  CN II-XII grossly intact, Non focal.  Neg [place]omberg.  Normal heel to toe/tiptoe walking.  Normal gait.   Psych:  TP linear. J/I WNL. Normal speech. Appropriate eye contact and affect.  Skin:  No Rash  Data Review Lab Results  Component Value Date   HGBA1C 7.1 (A) 06/25/2018   HGBA1C 5.9 10/30/2017   HGBA1C 5.9 02/20/2017  Assessment & Plan   1. Acute non intractable tension-type headache - ketorolac (TORADOL) injection 60 -methocarbamol at home and can use tylenol or advil-BP recheck manually 122/78  2. Controlled type 2 diabetes mellitus without complication, without long-term current use of insulin (HCC) Controlled today - Glucose (CBG)  3. Language barrier [employer] interpreters used and additional time performing visit was required.   4. Nausea zofran sent.  5. Chiari I malformation ([place]) She does not have any cerebellar signs today.  HA improved with tylenol at home and  toradol in office.  I have advised her to go to the ED/call 911 if she develops any problems with balance/speech/gait/ or other neurological problems.     Patient have been counseled extensively about nutrition and exercise  Return for keep December appt.  .  The patient was given clear instructions to go to ER or return to medical center if symptoms don't improve, worsen or new problems develop. The patient verbalized understanding. The patient was told to call to get lab results if they haven't heard anything in the next week.     Freeman Caldron, PA-C [facility] and [place], [place]   08/08/2018, 9:33 AM

## 2018-08-08 NOTE — Patient Instructions (Signed)
Go to the Emergency Department if your Headache worsens.

## 2018-08-08 NOTE — Progress Notes (Signed)
Pt. Stated she had this headache since Tuesday and it come with a stabbing pain.

## 2018-08-09 ENCOUNTER — Ambulatory Visit: Payer: Self-pay | Attending: Nurse Practitioner | Admitting: Nurse Practitioner

## 2018-08-09 ENCOUNTER — Encounter: Payer: Self-pay | Admitting: Nurse Practitioner

## 2018-08-09 VITALS — BP 154/84 | HR 91 | Temp 98.7°F | Ht 64.0 in | Wt 172.0 lb

## 2018-08-09 DIAGNOSIS — Z7984 Long term (current) use of oral hypoglycemic drugs: Secondary | ICD-10-CM | POA: Insufficient documentation

## 2018-08-09 DIAGNOSIS — R634 Abnormal weight loss: Secondary | ICD-10-CM

## 2018-08-09 DIAGNOSIS — E119 Type 2 diabetes mellitus without complications: Secondary | ICD-10-CM | POA: Insufficient documentation

## 2018-08-09 DIAGNOSIS — Z833 Family history of diabetes mellitus: Secondary | ICD-10-CM | POA: Insufficient documentation

## 2018-08-09 DIAGNOSIS — Z79899 Other long term (current) drug therapy: Secondary | ICD-10-CM | POA: Insufficient documentation

## 2018-08-09 LAB — GLUCOSE, POCT (MANUAL RESULT ENTRY): POC Glucose: 94 mg/dl (ref 70–99)

## 2018-08-09 MED ORDER — IBUPROFEN 800 MG PO TABS: 800.0000 mg | ORAL_TABLET | Freq: Three times a day (TID) | ORAL | 2 refills | Status: DC | PRN

## 2018-08-09 MED FILL — IBUPROFEN 800 MG TABLET: 800 | 15 days supply | Qty: 60 | Fill #0

## 2018-08-09 NOTE — Progress Notes (Signed)
Assessment & Plan:  [name] was seen today for weight loss.  Diagnoses and all orders for this visit:  Recent weight loss Continue dietary and exercise modifications as discussed.    Controlled type 2 diabetes mellitus without complication, without long-term current use of insulin (HCC) -     Glucose (CBG) -     TSH -     CBC Continue blood sugar control as discussed in office today, low carbohydrate diet, and regular physical exercise as tolerated, 150 minutes per week (30 min each day, 5 days per week, or 50 min 3 days per week). Keep blood sugar logs with fasting goal of 90-130 mg/dl, post prandial (after you eat) less than 180.  For Hypoglycemia: BS <60 and Hyperglycemia BS >400; contact the clinic ASAP. Annual eye exams and foot exams are recommended.    Patient has been counseled on age-appropriate routine health concerns for screening and prevention. These are reviewed and up-to-date. Referrals have been placed accordingly. Immunizations are up-to-date or declined.    Subjective:   Chief Complaint  Patient presents with  . Weight Loss    Pt. is concern that she is losing weight fast and would like to talk to PCP regarding about it.    HPI Marissa Jimenez 42 y.o. female presents to office today concerned about weight loss 17 lbs in 2 months however she has also made significant dietary changes and her metformin was increased over the past few months. She has stopped drinking sodas, eating less tortillas/breads and less carbs. I have assured her that her weight loss is expected due to recent changes with her medications as well as the dietary changes she has made recently. A1c is not due today. She denies any hypo or hyperglycemic symptoms.  Diabetes Mellitus Type 2 Chronic and stable. Taking metformin 1000 mg BID as prescribed. She does not exercise. Overdue for eye exam. Patient has been advised to apply for financial assistance and schedule to see our financial counselor.  Weight is decreased. Monitoring blood glucose levels at home daily.  Lab Results  Component Value Date   HGBA1C 7.1 (A) 06/25/2018     Review of Systems  Constitutional: Negative for fever, malaise/fatigue and weight loss.  HENT: Negative.  Negative for nosebleeds.   Eyes: Negative.  Negative for blurred vision, double vision and photophobia.  Respiratory: Negative.  Negative for cough and shortness of breath.   Cardiovascular: Negative.  Negative for chest pain, palpitations and leg swelling.  Gastrointestinal: Negative.  Negative for heartburn, nausea and vomiting.  Musculoskeletal: Negative.  Negative for myalgias.  Neurological: Negative.  Negative for dizziness, focal weakness, seizures and headaches.  Psychiatric/Behavioral: Negative.  Negative for suicidal ideas.    Past Medical History:  Diagnosis Date  . Arthritis 2012  . Headache 2013   . Subacromial bursitis 08/04/2013    Past Surgical History:  Procedure Laterality Date  . CESAREAN SECTION     two c-sections 2002, 2008  . TUBAL LIGATION      Family History  Problem Relation Age of Onset  . Hyperlipidemia Mother   . Arthritis Mother   . Diabetes Father   . Diabetes Sister   . Alcohol abuse Brother   . Cancer Neg Hx     Social History Reviewed with no changes to be made today.   Outpatient Medications Prior to Visit  Medication Sig Dispense Refill  . atorvastatin (LIPITOR) 10 MG tablet Take 1 tablet (10 mg total) by mouth daily. 30 tablet 2  .  Blood Glucose Monitoring Suppl (TRUE METRIX METER) w/Device KIT Use as instructed 1 kit 0  . fluticasone (FLONASE) 50 MCG/ACT nasal spray Place 2 sprays into both nostrils daily. 16 g 6  . glucose blood (TRUE METRIX BLOOD GLUCOSE TEST) test strip Check blood glucose levels once a day by fingerstick 100 each 12  . metFORMIN (GLUCOPHAGE) 500 MG tablet Take 2 tablets (1,000 mg total) by mouth 2 (two) times daily with a meal. 120 tablet 2  . methocarbamol (ROBAXIN) 500  MG tablet Take 1 tablet (500 mg total) by mouth every 8 (eight) hours as needed for muscle spasms. 90 tablet 0  . omeprazole (PRILOSEC) 20 MG capsule Take 20 mg by mouth 2 (two) times daily.    . ondansetron (ZOFRAN) 8 MG tablet Take 1 tablet (8 mg total) by mouth every 8 (eight) hours as needed for nausea or vomiting. 20 tablet 0  . TRUEPLUS LANCETS 28G MISC Check blood glucose levels once a day by fingerstick 100 each 3   No facility-administered medications prior to visit.     No Known Allergies     Objective:    BP (!) 154/84 (BP Location: Right Arm, Patient Position: Sitting, Cuff Size: Normal)   Pulse 91   Temp 98.7 F (37.1 C) (Oral)   Ht 5' 4"  (1.626 m)   Wt 172 lb (78 kg)   LMP 08/07/2018   SpO2 100%   BMI 29.52 kg/m  Wt Readings from Last 3 Encounters:  08/09/18 172 lb (78 kg)  08/08/18 172 lb 9.6 oz (78.3 kg)  07/23/18 177 lb 6.4 oz (80.5 kg)    Physical Exam  Constitutional: She is oriented to person, place, and time. She appears well-developed and well-nourished. She is cooperative.  HENT:  Head: Normocephalic and atraumatic.  Eyes: EOM are normal.  Neck: Normal range of motion.  Cardiovascular: Normal rate, regular rhythm and normal heart sounds. Exam reveals no gallop and no friction rub.  No murmur heard. Pulmonary/Chest: Effort normal and breath sounds normal. No tachypnea. No respiratory distress. She has no decreased breath sounds. She has no wheezes. She has no rhonchi. She has no rales. She exhibits no tenderness.  Abdominal: Soft. Bowel sounds are normal.  Musculoskeletal: Normal range of motion. She exhibits no edema.  Neurological: She is alert and oriented to person, place, and time. Coordination normal.  Skin: Skin is warm and dry.  Psychiatric: She has a normal mood and affect. Her behavior is normal. Judgment and thought content normal.  Nursing note and vitals reviewed.        Patient has been counseled extensively about nutrition and  exercise as well as the importance of adherence with medications and regular follow-up. The patient was given clear instructions to go to ER or return to medical center if symptoms don't improve, worsen or new problems develop. The patient verbalized understanding.   Follow-up: Return in 2 years (on 09/27/2020) for DM/headaches .   Gildardo Pounds, FNP-BC [facility] [place], [place]   08/11/2018, 3:21 PM

## 2018-08-10 LAB — CBC
Hematocrit: 39 % (ref 34.0–46.6)
Hemoglobin: 12.8 g/dL (ref 11.1–15.9)
MCH: 29.7 pg (ref 26.6–33.0)
MCHC: 32.8 g/dL (ref 31.5–35.7)
MCV: 91 fL (ref 79–97)
PLATELETS: 335 10*3/uL (ref 150–450)
RBC: 4.31 x10E6/uL (ref 3.77–5.28)
RDW: 13.1 % (ref 12.3–15.4)
WBC: 8.2 10*3/uL (ref 3.4–10.8)

## 2018-08-10 LAB — TSH: TSH: 1.64 u[IU]/mL (ref 0.450–4.500)

## 2018-08-11 ENCOUNTER — Encounter: Payer: Self-pay | Admitting: Nurse Practitioner

## 2018-08-13 ENCOUNTER — Telehealth: Payer: Self-pay

## 2018-08-13 NOTE — Telephone Encounter (Signed)
-----   Message from Gildardo Pounds, NP sent at 08/11/2018  3:24 PM EST ----- Thyroid level is normal as well as CBC. There is no anemia present.

## 2018-08-13 NOTE — Telephone Encounter (Addendum)
CMA attempt to reach patient to inform on results.  No answer and left a VM for patient to call back.  If patient call back, please inform:  Thyroid level is normal as well as CBC. There is no anemia present.  A letter will be send out to reach patient.

## 2018-08-14 MED FILL — ?METFORMIN HCL 500MG TABL: 500 | 30 days supply | Qty: 60 | Fill #2

## 2018-08-19 ENCOUNTER — Ambulatory Visit: Payer: Self-pay | Attending: Nurse Practitioner

## 2018-08-20 ENCOUNTER — Other Ambulatory Visit: Payer: Self-pay

## 2018-08-20 ENCOUNTER — Ambulatory Visit: Payer: Self-pay | Attending: Nurse Practitioner | Admitting: Nurse Practitioner

## 2018-08-20 ENCOUNTER — Encounter: Payer: Self-pay | Admitting: Nurse Practitioner

## 2018-08-20 ENCOUNTER — Telehealth: Payer: Self-pay | Admitting: Nurse Practitioner

## 2018-08-20 VITALS — BP 122/80 | HR 83 | Temp 98.1°F | Ht 64.0 in | Wt 172.0 lb

## 2018-08-20 DIAGNOSIS — Z79899 Other long term (current) drug therapy: Secondary | ICD-10-CM | POA: Insufficient documentation

## 2018-08-20 DIAGNOSIS — Z83438 Family history of other disorder of lipoprotein metabolism and other lipidemia: Secondary | ICD-10-CM | POA: Insufficient documentation

## 2018-08-20 DIAGNOSIS — Z9851 Tubal ligation status: Secondary | ICD-10-CM | POA: Insufficient documentation

## 2018-08-20 DIAGNOSIS — R002 Palpitations: Secondary | ICD-10-CM | POA: Insufficient documentation

## 2018-08-20 DIAGNOSIS — Z7984 Long term (current) use of oral hypoglycemic drugs: Secondary | ICD-10-CM | POA: Insufficient documentation

## 2018-08-20 DIAGNOSIS — E119 Type 2 diabetes mellitus without complications: Secondary | ICD-10-CM | POA: Insufficient documentation

## 2018-08-20 DIAGNOSIS — Z9889 Other specified postprocedural states: Secondary | ICD-10-CM | POA: Insufficient documentation

## 2018-08-20 DIAGNOSIS — Z833 Family history of diabetes mellitus: Secondary | ICD-10-CM | POA: Insufficient documentation

## 2018-08-20 LAB — GLUCOSE, POCT (MANUAL RESULT ENTRY): POC GLUCOSE: 82 mg/dL (ref 70–99)

## 2018-08-20 MED ORDER — METOPROLOL SUCCINATE ER 25 MG PO TB24: 12.5000 mg | ORAL_TABLET | Freq: Every day | ORAL | 1 refills | Status: DC

## 2018-08-20 MED FILL — METOPROLOL SUCCINATE ER 25: 25 | 30 days supply | Qty: 15 | Fill #0

## 2018-08-20 NOTE — Progress Notes (Signed)
Assessment & Plan:  Marissa Jimenez was seen today for tachycardia.  Diagnoses and all orders for this visit:  Heart palpitations -     EKG 12-Lead -     metoprolol succinate (TOPROL-XL) 25 MG 24 hr tablet; Take 0.5 tablets (12.5 mg total) by mouth daily.  Controlled type 2 diabetes mellitus without complication, without long-term current use of insulin (HCC) -     Glucose (CBG) Controlled.She denies any hypo or hyperglycemic symptoms. She has a log with her today with average readings Fasting:  110s. Postprandial 140s. Taking metformin 1000 mg BID as prescribed.  Lab Results  Component Value Date   HGBA1C 7.1 (A) 06/25/2018    Patient has been counseled on age-appropriate routine health concerns for screening and prevention. These are reviewed and up-to-date. Referrals have been placed accordingly. Immunizations are up-to-date or declined.    Subjective:   Chief Complaint  Patient presents with  . Tachycardia    pt. stated she is not having chest pain,  her heart felt like beating fast.    HPI Marissa Jimenez 42 y.o. female presents to office today with complaints of "feeling as if my heart was racing".  VRI was used to communicate directly with patient for the entire encounter including providing detailed patient instructions.   Palpitations Patient complains of rapid heart beat and shortness of breath  The symptoms are of moderate severity, occuring intermittently and lasting up to an hour per episode. Cardiac risk factors include: diabetes mellitus. Aggravating factors: none. Relieving factors: spontaneous. Associated signs and symptoms: none Duration: up to an hour. She currently denies any palpitations however she states she experienced palpations earlier today. Her shortness of breath was present prior to the onset of her palpitations   Review of Systems  Constitutional: Negative for fever, malaise/fatigue and weight loss.  HENT: Negative.  Negative for nosebleeds.   Eyes:  Negative.  Negative for blurred vision, double vision and photophobia.  Respiratory: Negative.  Negative for cough and shortness of breath.   Cardiovascular: Positive for palpitations. Negative for chest pain and leg swelling.  Gastrointestinal: Negative.  Negative for heartburn, nausea and vomiting.  Musculoskeletal: Negative.  Negative for myalgias.  Neurological: Negative.  Negative for dizziness, focal weakness, seizures and headaches.  Psychiatric/Behavioral: Negative.  Negative for suicidal ideas.    Past Medical History:  Diagnosis Date  . Arthritis 2012  . Headache 2013   . Subacromial bursitis 08/04/2013    Past Surgical History:  Procedure Laterality Date  . CESAREAN SECTION     two c-sections 2002, 2008  . TUBAL LIGATION      Family History  Problem Relation Age of Onset  . Hyperlipidemia Mother   . Arthritis Mother   . Diabetes Father   . Diabetes Sister   . Alcohol abuse Brother   . Cancer Neg Hx     Social History Reviewed with no changes to be made today.   Outpatient Medications Prior to Visit  Medication Sig Dispense Refill  . atorvastatin (LIPITOR) 10 MG tablet Take 1 tablet (10 mg total) by mouth daily. 30 tablet 2  . Blood Glucose Monitoring Suppl (TRUE METRIX METER) w/Device KIT Use as instructed 1 kit 0  . fluticasone (FLONASE) 50 MCG/ACT nasal spray Place 2 sprays into both nostrils daily. 16 g 6  . glucose blood (TRUE METRIX BLOOD GLUCOSE TEST) test strip Check blood glucose levels once a day by fingerstick 100 each 12  . ibuprofen (ADVIL,MOTRIN) 800 MG tablet Take 1 tablet (  800 mg total) by mouth every 8 (eight) hours as needed. 60 tablet 2  . metFORMIN (GLUCOPHAGE) 500 MG tablet Take 2 tablets (1,000 mg total) by mouth 2 (two) times daily with a meal. 120 tablet 2  . methocarbamol (ROBAXIN) 500 MG tablet Take 1 tablet (500 mg total) by mouth every 8 (eight) hours as needed for muscle spasms. 90 tablet 0  . omeprazole (PRILOSEC) 20 MG capsule Take  20 mg by mouth 2 (two) times daily.    . ondansetron (ZOFRAN) 8 MG tablet Take 1 tablet (8 mg total) by mouth every 8 (eight) hours as needed for nausea or vomiting. 20 tablet 0  . TRUEPLUS LANCETS 28G MISC Check blood glucose levels once a day by fingerstick 100 each 3   No facility-administered medications prior to visit.     No Known Allergies     Objective:    BP 122/80 (BP Location: Left Arm, Patient Position: Sitting, Cuff Size: Normal)   Pulse 83   Temp 98.1 F (36.7 C) (Oral)   Ht 5' 4"  (1.626 m)   Wt 172 lb (78 kg)   LMP 08/07/2018   SpO2 100%   BMI 29.52 kg/m  Wt Readings from Last 3 Encounters:  08/20/18 172 lb (78 kg)  08/09/18 172 lb (78 kg)  08/08/18 172 lb 9.6 oz (78.3 kg)    Physical Exam  Constitutional: She is oriented to person, place, and time. She appears well-developed and well-nourished. She is cooperative.  HENT:  Head: Normocephalic and atraumatic.  Eyes: EOM are normal.  Neck: Normal range of motion.  Cardiovascular: Normal rate and regular rhythm. Exam reveals no gallop and no friction rub.  Murmur heard. Pulmonary/Chest: Effort normal and breath sounds normal. No stridor. No tachypnea. No respiratory distress. She has no decreased breath sounds. She has no wheezes. She has no rhonchi. She has no rales. She exhibits no tenderness.  Abdominal: Bowel sounds are normal.  Musculoskeletal: Normal range of motion. She exhibits no edema.  Neurological: She is alert and oriented to person, place, and time. Coordination normal.  Skin: Skin is warm and dry.  Psychiatric: She has a normal mood and affect. Her behavior is normal. Judgment and thought content normal.  Nursing note and vitals reviewed.      Patient has been counseled extensively about nutrition and exercise as well as the importance of adherence with medications and regular follow-up. The patient was given clear instructions to go to ER or return to medical center if symptoms don't improve,  worsen or new problems develop. The patient verbalized understanding.   Follow-up: No follow-ups on file. She has appt already scheduled for next month   Elida Harbin W Arleta Ostrum, FNP-BC [facility] [place], [place]   08/20/2018, 5:37 PM

## 2018-08-20 NOTE — Patient Instructions (Signed)
Palpitaciones Palpitations Es la sensacin de sentir que el latido cardaco es irregular o es ms rpido que lo normal. Se siente como un aleteo o que falta un latido. Generalmente no es un problema grave. [place] palpitaciones pueden ser diversas, entre ellas, el estrs y consumo de cigarrillos, cafena, alcohol y determinados medicamentos. Si bien la [employer] de las causas de las palpitaciones no son graves, estas pueden ser un signo de un problema mdico grave. En algunos casos, podra ser necesario hacer ms estudios. Siga estas instrucciones en su casa: Est atento a cualquier cambio en los sntomas. Tome estas medidas para controlar la afeccin:  Evite consumir lo siguiente: ? TEFL teacher que contengan cafena como el caf, el t, los refrescos, las pastillas para Horticulturist, commercial y las bebidas energizantes. ? Chocolate. ? Alcohol.  No consuma ningn producto que contenga tabaco, lo que incluye cigarrillos, tabaco de Higher education careers adviser y Psychologist, sport and exercise. Si necesita ayuda para dejar de fumar, consulte al [employer].  Trate de reducir los niveles de estrs y [place]. Algunas cosas que pueden ayudarlo a relajarse son: ? Practicar yoga. ? Meditacin. ? Actividad fsica como natacin, trote o caminatas. ? Biorretroalimentacin. Este es un mtodo que le ensea a usar la mente para Chief Technology Officer cosas del cuerpo, como los latidos del corazn.  Descanse y duerma lo suficiente.  Tome los medicamentos de venta libre y los recetados solamente como se lo haya indicado el mdico.  Consulting civil engineer a todas las visitas de control como se lo haya indicado el mdico. Esto es importante.  Comunquese con un mdico si:  Contina con latidos cardacos rpidos o irregulares despus de 24 horas.  Las [employer] suceden con ms frecuencia. Solicite ayuda de inmediato si:  Siente falta de aire o dolor en el pecho.  Tiene un dolor de cabeza intenso.  Se siente mareado o se desmaya. Esta informacin no tiene  Marine scientist el consejo del mdico. Asegrese de hacerle al mdico cualquier pregunta que tenga. Document Released: 07/05/2005 Document Revised: 01/02/2017 Document Reviewed: 06/10/2015 Elsevier Interactive Patient Education  [employer].

## 2018-08-23 ENCOUNTER — Telehealth: Payer: Self-pay

## 2018-08-23 NOTE — Telephone Encounter (Signed)
-----   Message from Charlott Rakes, MD sent at 08/01/2018  8:27 AM EDT ----- Please inform the patient that labs are normal. Thank you.

## 2018-08-23 NOTE — Telephone Encounter (Signed)
Patient was called via interpreter and informed of normal lab results.

## 2018-08-27 MED FILL — ?METFORMIN HCL 500MG TABS: 500 | 30 days supply | Qty: 120 | Fill #0

## 2018-08-27 MED FILL — ?ATORVASTATIN 10 MG TABLET: 10 | 30 days supply | Qty: 30 | Fill #1

## 2018-08-28 ENCOUNTER — Telehealth: Payer: Self-pay | Admitting: Nurse Practitioner

## 2018-08-28 NOTE — Telephone Encounter (Signed)
I called Pt since received a e-mail about Pt husband that need prove of income, Pt informed me that her husband does not work, does not have bank account or file taxes. I forwer the information to be review the [employer] apliaction

## 2018-09-24 ENCOUNTER — Encounter: Payer: Self-pay | Admitting: Nurse Practitioner

## 2018-09-24 ENCOUNTER — Ambulatory Visit: Payer: Self-pay | Attending: Nurse Practitioner | Admitting: Nurse Practitioner

## 2018-09-24 ENCOUNTER — Other Ambulatory Visit: Payer: Self-pay

## 2018-09-24 VITALS — BP 138/80 | HR 85 | Temp 98.4°F | Ht 64.0 in | Wt 161.2 lb

## 2018-09-24 DIAGNOSIS — M1990 Unspecified osteoarthritis, unspecified site: Secondary | ICD-10-CM | POA: Insufficient documentation

## 2018-09-24 DIAGNOSIS — E1165 Type 2 diabetes mellitus with hyperglycemia: Secondary | ICD-10-CM | POA: Insufficient documentation

## 2018-09-24 DIAGNOSIS — Z7984 Long term (current) use of oral hypoglycemic drugs: Secondary | ICD-10-CM | POA: Insufficient documentation

## 2018-09-24 DIAGNOSIS — E782 Mixed hyperlipidemia: Secondary | ICD-10-CM | POA: Insufficient documentation

## 2018-09-24 DIAGNOSIS — E118 Type 2 diabetes mellitus with unspecified complications: Secondary | ICD-10-CM | POA: Insufficient documentation

## 2018-09-24 DIAGNOSIS — Z79899 Other long term (current) drug therapy: Secondary | ICD-10-CM | POA: Insufficient documentation

## 2018-09-24 LAB — POCT GLYCOSYLATED HEMOGLOBIN (HGB A1C): Hemoglobin A1C: 5.5 % (ref 4.0–5.6)

## 2018-09-24 LAB — GLUCOSE, POCT (MANUAL RESULT ENTRY): POC Glucose: 83 mg/dl (ref 70–99)

## 2018-09-24 MED ORDER — ATORVASTATIN CALCIUM 10 MG PO TABS: 10.0000 mg | ORAL_TABLET | Freq: Every day | ORAL | 2 refills | Status: DC

## 2018-09-24 MED ORDER — LISINOPRIL 5 MG PO TABS: 5.0000 mg | ORAL_TABLET | Freq: Every day | ORAL | 3 refills | Status: DC

## 2018-09-24 MED ORDER — GLUCOSE BLOOD VI STRP: ORAL_STRIP | 12 refills | Status: DC

## 2018-09-24 MED ORDER — METFORMIN HCL 500 MG PO TABS: 500.0000 mg | ORAL_TABLET | Freq: Two times a day (BID) | ORAL | 1 refills | Status: DC

## 2018-09-24 MED ORDER — TRUEPLUS LANCETS 28G MISC: 3 refills | Status: DC

## 2018-09-24 MED FILL — TRUEplus LANCETS 28G MISC: 30 days supply | Qty: 100 | Fill #2

## 2018-09-24 MED FILL — TRUE METRIX TEST STRIP: 30 days supply | Qty: 100 | Fill #2

## 2018-09-24 MED FILL — LISINOPRIL 5 MG TAB: 5 | 30 days supply | Qty: 30 | Fill #0

## 2018-09-24 MED FILL — ?ATORVASTATIN 10 MG TABLET: 10 | 30 days supply | Qty: 30 | Fill #2

## 2018-09-24 MED FILL — ?METFORMIN HCL 500MG TABS: 500 | 30 days supply | Qty: 120 | Fill #1

## 2018-09-24 NOTE — Progress Notes (Signed)
Assessment & Plan:  Marissa Jimenez was seen today for diabetes.  Diagnoses and all orders for this visit:  Controlled type 2 diabetes mellitus with complication, without long-term current use of insulin (HCC) -     Glucose (CBG) -     POCT glycosylated hemoglobin (Hb A1C) -     metFORMIN (GLUCOPHAGE) 500 MG tablet; Take 1 tablet (500 mg total) by mouth 2 (two) times daily with a meal. -     glucose blood (TRUE METRIX BLOOD GLUCOSE TEST) test strip; Check blood glucose levels once a day by fingerstick -     TRUEPLUS LANCETS 28G MISC; Check blood glucose levels once a day by fingerstick Continue blood sugar control as discussed in office today, low carbohydrate diet, and regular physical exercise as tolerated, 150 minutes per week (30 min each day, 5 days per week, or 50 min 3 days per week). Keep blood sugar logs with fasting goal of 90-130 mg/dl, post prandial (after you eat) less than 180.  For Hypoglycemia: BS <60 and Hyperglycemia BS >400; contact the clinic ASAP. Annual eye exams and foot exams are recommended.   Mixed hyperlipidemia -     atorvastatin (LIPITOR) 10 MG tablet; Take 1 tablet (10 mg total) by mouth daily. INSTRUCTIONS: Work on a low fat, heart healthy diet and participate in regular aerobic exercise program by working out at least 150 minutes per week; 5 days a week-30 minutes per day. Avoid red meat, fried foods. junk foods, sodas, sugary drinks, unhealthy snacking, alcohol and smoking.  Drink at least 48oz of water per day and monitor your carbohydrate intake daily.     Patient has been counseled on age-appropriate routine health concerns for screening and prevention. These are reviewed and up-to-date. Referrals have been placed accordingly. Immunizations are up-to-date or declined.    Subjective:   Chief Complaint  Patient presents with  . Diabetes   HPI Marissa Jimenez 42 y.o. female presents to office today for follow up to DM.   DM Type 2 Last A1c 7.1 currently  down to 5.5. Current medications include metformin 1000 mg BID. Lab Results  Component Value Date   HGBA1C 5.5 09/24/2018   Lab Results  Component Value Date   HGBA1C 7.1 (A) 06/25/2018  Checking blood glucose levels twice a day.  She has her meter with her today with average as follows:  7 day average 100 14 day average 104 30 day average 103 She denies any hypo or hyperglycemic symptoms. She has lost 10lbs. Has changed her diet and states she feels good!!!! Overdue for eye exam. Patient has been advised to apply for financial assistance and schedule to see our financial counselor.    Review of Systems  Constitutional: Negative for fever, malaise/fatigue and weight loss.  HENT: Negative.  Negative for nosebleeds.   Eyes: Negative.  Negative for blurred vision, double vision and photophobia.  Respiratory: Negative.  Negative for cough and shortness of breath.   Cardiovascular: Negative.  Negative for chest pain, palpitations and leg swelling.  Gastrointestinal: Negative.  Negative for heartburn, nausea and vomiting.  Musculoskeletal: Negative.  Negative for myalgias.  Neurological: Negative.  Negative for dizziness, focal weakness, seizures and headaches.  Psychiatric/Behavioral: Negative.  Negative for suicidal ideas.    Past Medical History:  Diagnosis Date  . Arthritis 2012  . Headache 2013   . Subacromial bursitis 08/04/2013    Past Surgical History:  Procedure Laterality Date  . CESAREAN SECTION     two c-sections 2002,  2008  . TUBAL LIGATION      Family History  Problem Relation Age of Onset  . Hyperlipidemia Mother   . Arthritis Mother   . Diabetes Father   . Diabetes Sister   . Alcohol abuse Brother   . Cancer Neg Hx     Social History Reviewed with no changes to be made today.   Outpatient Medications Prior to Visit  Medication Sig Dispense Refill  . Blood Glucose Monitoring Suppl (TRUE METRIX METER) w/Device KIT Use as instructed 1 kit 0  .  fluticasone (FLONASE) 50 MCG/ACT nasal spray Place 2 sprays into both nostrils daily. 16 g 6  . ibuprofen (ADVIL,MOTRIN) 800 MG tablet Take 1 tablet (800 mg total) by mouth every 8 (eight) hours as needed. 60 tablet 2  . methocarbamol (ROBAXIN) 500 MG tablet Take 1 tablet (500 mg total) by mouth every 8 (eight) hours as needed for muscle spasms. 90 tablet 0  . atorvastatin (LIPITOR) 10 MG tablet Take 1 tablet (10 mg total) by mouth daily. 30 tablet 2  . glucose blood (TRUE METRIX BLOOD GLUCOSE TEST) test strip Check blood glucose levels once a day by fingerstick 100 each 12  . metFORMIN (GLUCOPHAGE) 500 MG tablet Take 2 tablets (1,000 mg total) by mouth 2 (two) times daily with a meal. 120 tablet 2  . TRUEPLUS LANCETS 28G MISC Check blood glucose levels once a day by fingerstick 100 each 3  . metoprolol succinate (TOPROL-XL) 25 MG 24 hr tablet Take 0.5 tablets (12.5 mg total) by mouth daily. 45 tablet 1  . omeprazole (PRILOSEC) 20 MG capsule Take 20 mg by mouth 2 (two) times daily.    . ondansetron (ZOFRAN) 8 MG tablet Take 1 tablet (8 mg total) by mouth every 8 (eight) hours as needed for nausea or vomiting. (Patient not taking: Reported on 09/24/2018) 20 tablet 0   No facility-administered medications prior to visit.     No Known Allergies     Objective:    BP 138/80   Pulse 85   Temp 98.4 F (36.9 C)   Ht _0  (1.626 m)   Wt 161 lb 3.2 oz (73.1 kg)   LMP 09/21/2018   SpO2 100%   BMI 27.67 kg/m  Wt Readings from Last 3 Encounters:  09/24/18 161 lb 3.2 oz (73.1 kg)  08/20/18 172 lb (78 kg)  08/09/18 172 lb (78 kg)    Physical Exam Vitals signs and nursing note reviewed.  Constitutional:      Appearance: She is well-developed.  HENT:     Head: Normocephalic and atraumatic.  Neck:     Musculoskeletal: Normal range of motion.  Cardiovascular:     Rate and Rhythm: Normal rate and regular rhythm.     Heart sounds: Normal heart sounds. No murmur. No friction rub. No gallop.     Pulmonary:     Effort: Pulmonary effort is normal. No tachypnea or respiratory distress.     Breath sounds: Normal breath sounds. No decreased breath sounds, wheezing, rhonchi or rales.  Chest:     Chest wall: No tenderness.  Abdominal:     General: Bowel sounds are normal.     Palpations: Abdomen is soft.  Musculoskeletal: Normal range of motion.  Skin:    General: Skin is warm and dry.  Neurological:     Mental Status: She is alert and oriented to person, place, and time.     Coordination: Coordination normal.  Psychiatric:  Behavior: Behavior normal. Behavior is cooperative.        Thought Content: Thought content normal.        Judgment: Judgment normal.          Patient has been counseled extensively about nutrition and exercise as well as the importance of adherence with medications and regular follow-up. The patient was given clear instructions to go to ER or return to medical center if symptoms don't improve, worsen or new problems develop. The patient verbalized understanding.   Follow-up: Return in about 3 months (around 12/24/2018) for /LIPDS.   Gildardo Pounds, FNP-BC [facility] and [place], [place]   09/24/2018, 10:10 AM

## 2018-10-15 ENCOUNTER — Telehealth: Payer: Self-pay | Admitting: Nurse Practitioner

## 2018-10-15 NOTE — Telephone Encounter (Signed)
1) Medication(s) Requested (by name): Blood glucose monitor Patient says her meter isn't working and won't turn on. 2) Pharmacy of Choice:  [email or web address]

## 2018-10-15 NOTE — Telephone Encounter (Signed)
Tried to contact patient but something went wrong with the call. Will try again later.

## 2018-10-15 NOTE — Telephone Encounter (Signed)
The meter is from 06/2018 and should still be in working order, please advise the patient to try replacing the battery or call customer service @ [phone] if the meter is faulty they can replace it for her at no cost.

## 2018-10-23 ENCOUNTER — Other Ambulatory Visit (HOSPITAL_COMMUNITY): Payer: Self-pay | Admitting: *Deleted

## 2018-10-23 DIAGNOSIS — Z1231 Encounter for screening mammogram for malignant neoplasm of breast: Secondary | ICD-10-CM

## 2018-10-25 ENCOUNTER — Telehealth: Payer: Self-pay | Admitting: Nurse Practitioner

## 2018-10-25 NOTE — Telephone Encounter (Signed)
1) Medication(s) Requested (by name): -atorvastatin (LIPITOR) 10 MG tablet   2) Pharmacy of Choice: -[facility] 3) Special Requests:   Approved medications will be sent to the pharmacy, we will reach out if there is an issue.  Requests made after 3pm may not be addressed until the following business day!  If a patient is unsure of the name of the medication(s) please note and ask patient to call back when they are able to provide all info, do not send to responsible party until all information is available!

## 2018-10-28 MED FILL — ?ATORVASTATIN 10 MG TABLET: 10 | 30 days supply | Qty: 30 | Fill #0

## 2018-10-28 NOTE — Telephone Encounter (Signed)
Pt had refills available at pharmacy, rx was submitted for refill and should be ready later today. No further action required.

## 2018-11-27 MED FILL — ?METFORMIN HCL 500MG TABL: 500 | 30 days supply | Qty: 120 | Fill #2

## 2018-11-27 MED FILL — ?ATORVASTATIN 10 MG TABLET: 10 | 30 days supply | Qty: 30 | Fill #1

## 2018-12-24 ENCOUNTER — Ambulatory Visit: Payer: Self-pay | Attending: Nurse Practitioner | Admitting: Nurse Practitioner

## 2018-12-24 ENCOUNTER — Other Ambulatory Visit: Payer: Self-pay

## 2018-12-24 ENCOUNTER — Encounter: Payer: Self-pay | Admitting: Nurse Practitioner

## 2018-12-24 VITALS — BP 138/87 | HR 76 | Temp 97.9°F | Ht 64.0 in | Wt 162.6 lb

## 2018-12-24 DIAGNOSIS — E785 Hyperlipidemia, unspecified: Secondary | ICD-10-CM

## 2018-12-24 DIAGNOSIS — R002 Palpitations: Secondary | ICD-10-CM

## 2018-12-24 DIAGNOSIS — E118 Type 2 diabetes mellitus with unspecified complications: Secondary | ICD-10-CM

## 2018-12-24 DIAGNOSIS — I10 Essential (primary) hypertension: Secondary | ICD-10-CM

## 2018-12-24 LAB — GLUCOSE, POCT (MANUAL RESULT ENTRY): POC Glucose: 108 mg/dl — AB (ref 70–99)

## 2018-12-24 MED ORDER — LISINOPRIL 2.5 MG PO TABS: 2.5000 mg | ORAL_TABLET | Freq: Every day | ORAL | 1 refills | Status: DC

## 2018-12-24 MED ORDER — METFORMIN HCL 500 MG PO TABS: 500.0000 mg | ORAL_TABLET | Freq: Two times a day (BID) | ORAL | 1 refills | Status: DC

## 2018-12-24 MED ORDER — METOPROLOL SUCCINATE ER 25 MG PO TB24: 12.5000 mg | ORAL_TABLET | Freq: Every day | ORAL | 2 refills | Status: DC

## 2018-12-24 MED ORDER — GLUCOSE BLOOD VI STRP: ORAL_STRIP | 12 refills | Status: DC

## 2018-12-24 MED ORDER — ATORVASTATIN CALCIUM 10 MG PO TABS: 10.0000 mg | ORAL_TABLET | Freq: Every day | ORAL | 2 refills | Status: DC

## 2018-12-24 MED FILL — ?ATORVASTATIN 10 MG TABLET: 10 | 90 days supply | Qty: 90 | Fill #0

## 2018-12-24 MED FILL — TRUE METRIX TEST STRIP: 25 days supply | Qty: 100 | Fill #0

## 2018-12-24 MED FILL — LISINOPRIL 2.5 MG TABLET: 2.5 | 90 days supply | Qty: 90 | Fill #0

## 2018-12-24 MED FILL — ?METFORMIN HCL 500MG TABL: 500 | 90 days supply | Qty: 180 | Fill #0

## 2018-12-24 MED FILL — ?METOPROLOL SUCC ER 25MG TA: 25 | 90 days supply | Qty: 45 | Fill #0

## 2018-12-24 NOTE — Progress Notes (Signed)
Assessment & Plan:  [name] was seen today for follow-up.  Diagnoses and all orders for this visit:  Controlled type 2 diabetes mellitus with complication, without long-term current use of insulin (HCC) -     Glucose (CBG) -     glucose blood (TRUE METRIX BLOOD GLUCOSE TEST) test strip; Check blood glucose levels once a day by fingerstick -     metFORMIN (GLUCOPHAGE) 500 MG tablet; Take 1 tablet (500 mg total) by mouth 2 (two) times daily with a meal. -     CMP14+EGFR Controlled Continue medications as prescribed.  Continue blood sugar control as discussed in office today, low carbohydrate diet, and regular physical exercise as tolerated, 150 minutes per week (30 min each day, 5 days per week, or 50 min 3 days per week). Keep blood sugar logs with fasting goal of 90-130 mg/dl, post prandial (after you eat) less than 180.  For Hypoglycemia: BS <60 and Hyperglycemia BS >400; contact the clinic ASAP. Annual eye exams and foot exams are recommended.  Hyperlipidemia LDL goal <70 -     atorvastatin (LIPITOR) 10 MG tablet; Take 1 tablet (10 mg total) by mouth daily. -     Lipid panel INSTRUCTIONS: Work on a low fat, heart healthy diet and participate in regular aerobic exercise program by working out at least 150 minutes per week; 5 days a week-30 minutes per day. Avoid red meat, fried foods. junk foods, sodas, sugary drinks, unhealthy snacking, alcohol and smoking.  Drink at least 48oz of water per day and monitor your carbohydrate intake daily.    Heart palpitations -     metoprolol succinate (TOPROL-XL) 25 MG 24 hr tablet; Take 0.5 tablets (12.5 mg total) by mouth daily. -     CMP14+EGFR  Essential hypertension -     lisinopril (PRINIVIL,ZESTRIL) 2.5 MG tablet; Take 1 tablet (2.5 mg total) by mouth daily. -     metoprolol succinate (TOPROL-XL) 25 MG 24 hr tablet; Take 0.5 tablets (12.5 mg total) by mouth daily. Continue all antihypertensives as prescribed.  Remember to bring in your blood  pressure log with you for your follow up appointment.  DASH/Mediterranean Diets are healthier choices for HTN.    Patient has been counseled on age-appropriate routine health concerns for screening and prevention. These are reviewed and up-to-date. Referrals have been placed accordingly. Immunizations are up-to-date or declined.    Subjective:   Chief Complaint  Patient presents with   Follow-up    Pt. is here for Lipids.    HPI Marissa Jimenez 43 y.o. female presents to office today for follow up. She currently has well controlled DM TYPE 2. Taking atorvastatin 10m and Lisinopril 583mdaily based on ADA guidelines. VRI was used to communicate directly with patient for the entire encounter including providing detailed patient instructions.   DM TYPE 2 Chronic and well controlled. Last A1c 5.5. LDL not at goal.  Will recheck today. She has her meter today. Monitoring her blood glucose levels twice per day. Average readings.  7 day 116 14 day 116 30 day 116 She is taking metformin 500 mg BID as prescribed. Denies any hypo or hyperglycemic symptoms.  Lab Results  Component Value Date   LDLCALC 91 01/28/2018    Essential Hypertension STAGE 1 Controlled. Taking lisinopril 5 mg daily. Will decrease to 2.65m58maily as her metoprolol 12.65mg72mily will be restarted today due to reoccurring palpitations. Denies chest pain, shortness of breath, lightheadedness, dizziness, headaches or BLE edema.  BP  Readings from Last 3 Encounters:  12/24/18 138/87  09/24/18 138/80  08/20/18 122/80     Palpitations She was started on metoprolol 12.17m 4 months ago for palpitations. Today she states the palpitations resolved for a few months however she has been experiencing short episodes over the past few weeks. She also states she has not been taking the metoprolol anymore. I have instructed her to resume the metoprolol and we will re evaluate her symptoms in a few weeks. She denies any chest pain,  shortness of breath or syncope. The symptoms are of mild severity, occuring intermittently and lasting a few seconds per episode. Cardiac risk factors include: diabetes mellitus and hypertension. Aggravating factors: none. Relieving factors: spontaneous. Associated signs and symptoms: has no complaint(s) of chest pain, chest pressure/discomfort, claudication, dyspnea, exertional chest pressure/discomfort, fatigue, lower extremity edema, near-syncope, palpitations, paroxysmal nocturnal dyspnea, syncope and tachypnea  Review of Systems  Constitutional: Negative for fever, malaise/fatigue and weight loss.  HENT: Negative.  Negative for nosebleeds.   Eyes: Negative.  Negative for blurred vision, double vision and photophobia.  Respiratory: Negative.  Negative for cough and shortness of breath.   Cardiovascular: Positive for palpitations. Negative for chest pain and leg swelling.  Gastrointestinal: Negative.  Negative for heartburn, nausea and vomiting.  Musculoskeletal: Negative.  Negative for myalgias.  Neurological: Negative.  Negative for dizziness, focal weakness, seizures and headaches.  Psychiatric/Behavioral: Negative.  Negative for suicidal ideas.    Past Medical History:  Diagnosis Date   Arthritis 2012   Headache 2013    Subacromial bursitis 08/04/2013    Past Surgical History:  Procedure Laterality Date   CESAREAN SECTION     two c-sections 2002, 2008   TUBAL LIGATION      Family History  Problem Relation Age of Onset   Hyperlipidemia Mother    Arthritis Mother    Diabetes Father    Diabetes Sister    Alcohol abuse Brother    Cancer Neg Hx     Social History Reviewed with no changes to be made today.   Outpatient Medications Prior to Visit  Medication Sig Dispense Refill   Blood Glucose Monitoring Suppl (TRUE METRIX METER) w/Device KIT Use as instructed 1 kit 0   fluticasone (FLONASE) 50 MCG/ACT nasal spray Place 2 sprays into both nostrils daily. 16 g 6    ibuprofen (ADVIL,MOTRIN) 800 MG tablet Take 1 tablet (800 mg total) by mouth every 8 (eight) hours as needed. 60 tablet 2   methocarbamol (ROBAXIN) 500 MG tablet Take 1 tablet (500 mg total) by mouth every 8 (eight) hours as needed for muscle spasms. 90 tablet 0   TRUEPLUS LANCETS 28G MISC Check blood glucose levels once a day by fingerstick 100 each 3   atorvastatin (LIPITOR) 10 MG tablet Take 1 tablet (10 mg total) by mouth daily. 90 tablet 2   glucose blood (TRUE METRIX BLOOD GLUCOSE TEST) test strip Check blood glucose levels once a day by fingerstick 100 each 12   omeprazole (PRILOSEC) 20 MG capsule Take 20 mg by mouth 2 (two) times daily.     lisinopril (PRINIVIL,ZESTRIL) 5 MG tablet Take 1 tablet (5 mg total) by mouth daily. (Patient not taking: Reported on 12/24/2018) 90 tablet 3   metFORMIN (GLUCOPHAGE) 500 MG tablet Take 1 tablet (500 mg total) by mouth 2 (two) times daily with a meal. 180 tablet 1   metoprolol succinate (TOPROL-XL) 25 MG 24 hr tablet Take 0.5 tablets (12.5 mg total) by mouth daily. 45 tablet  1   ondansetron (ZOFRAN) 8 MG tablet Take 1 tablet (8 mg total) by mouth every 8 (eight) hours as needed for nausea or vomiting. (Patient not taking: Reported on 09/24/2018) 20 tablet 0   No facility-administered medications prior to visit.     No Known Allergies     Objective:    BP 138/87 (BP Location: Right Arm, Patient Position: Sitting, Cuff Size: Normal)    Pulse 76    Temp 97.9 F (36.6 C) (Oral)    Ht 5' 4"  (1.626 m)    Wt 162 lb 9.6 oz (73.8 kg)    SpO2 100%    BMI 27.91 kg/m  Wt Readings from Last 3 Encounters:  12/24/18 162 lb 9.6 oz (73.8 kg)  09/24/18 161 lb 3.2 oz (73.1 kg)  08/20/18 172 lb (78 kg)    Physical Exam Vitals signs and nursing note reviewed.  Constitutional:      Appearance: She is well-developed.  HENT:     Head: Normocephalic and atraumatic.  Neck:     Musculoskeletal: Normal range of motion.  Cardiovascular:     Rate and  Rhythm: Normal rate and regular rhythm.     Heart sounds: Normal heart sounds. No murmur. No friction rub. No gallop.   Pulmonary:     Effort: Pulmonary effort is normal. No tachypnea or respiratory distress.     Breath sounds: Normal breath sounds. No decreased breath sounds, wheezing, rhonchi or rales.  Chest:     Chest wall: No tenderness.  Abdominal:     General: Bowel sounds are normal.     Palpations: Abdomen is soft.  Musculoskeletal: Normal range of motion.     Right lower leg: No edema.     Left lower leg: No edema.  Skin:    General: Skin is warm and dry.  Neurological:     Mental Status: She is alert and oriented to person, place, and time.     Coordination: Coordination normal.  Psychiatric:        Behavior: Behavior normal. Behavior is cooperative.        Thought Content: Thought content normal.        Judgment: Judgment normal.         Patient has been counseled extensively about nutrition and exercise as well as the importance of adherence with medications and regular follow-up. The patient was given clear instructions to go to ER or return to medical center if symptoms don't improve, worsen or new problems develop. The patient verbalized understanding.   Follow-up: Return for f/u 4 weeks palpitations; 3 months DM.   Gildardo Pounds, FNP-BC [facility] [place], [place]   12/24/2018, 10:29 AM

## 2018-12-25 LAB — CMP14+EGFR
ALT: 13 IU/L (ref 0–32)
AST: 9 IU/L (ref 0–40)
Albumin/Globulin Ratio: 2 (ref 1.2–2.2)
Albumin: 4.6 g/dL (ref 3.8–4.8)
Alkaline Phosphatase: 44 IU/L (ref 39–117)
BUN/Creatinine Ratio: 20 (ref 9–23)
BUN: 10 mg/dL (ref 6–24)
Bilirubin Total: 0.6 mg/dL (ref 0.0–1.2)
CO2: 23 mmol/L (ref 20–29)
Calcium: 9.3 mg/dL (ref 8.7–10.2)
Chloride: 102 mmol/L (ref 96–106)
Creatinine, Ser: 0.5 mg/dL — ABNORMAL LOW (ref 0.57–1.00)
GFR calc Af Amer: 138 mL/min/{1.73_m2} (ref >59)
GFR calc non Af Amer: 120 mL/min/{1.73_m2} (ref >59)
Globulin, Total: 2.3 g/dL (ref 1.5–4.5)
Glucose: 94 mg/dL (ref 65–99)
Potassium: 4.4 mmol/L (ref 3.5–5.2)
Sodium: 141 mmol/L (ref 134–144)
Total Protein: 6.9 g/dL (ref 6.0–8.5)

## 2018-12-25 LAB — LIPID PANEL
Chol/HDL Ratio: 1.9 ratio (ref 0.0–4.4)
Cholesterol, Total: 131 mg/dL (ref 100–199)
HDL: 69 mg/dL
LDL Calculated: 54 mg/dL (ref 0–99)
Triglycerides: 42 mg/dL (ref 0–149)
VLDL Cholesterol Cal: 8 mg/dL (ref 5–40)

## 2018-12-26 ENCOUNTER — Ambulatory Visit (HOSPITAL_COMMUNITY): Payer: Self-pay

## 2018-12-26 ENCOUNTER — Ambulatory Visit
Admission: RE | Admit: 2018-12-26 | Discharge: 2018-12-26 | Disposition: A | Discharge status: Home / Self Care | Payer: Self-pay | Source: Ambulatory Visit | Attending: Obstetrics and Gynecology | Admitting: Obstetrics and Gynecology

## 2018-12-26 ENCOUNTER — Other Ambulatory Visit: Payer: Self-pay

## 2018-12-26 DIAGNOSIS — Z1231 Encounter for screening mammogram for malignant neoplasm of breast: Secondary | ICD-10-CM

## 2018-12-26 MED FILL — TRUEplus LANCETS 28G MISC: 30 days supply | Qty: 100 | Fill #3

## 2018-12-30 ENCOUNTER — Telehealth: Payer: Self-pay

## 2018-12-30 NOTE — Telephone Encounter (Signed)
CMA attempt to reach patient to inform on results.  No answer and left a VM for a call back.  

## 2018-12-30 NOTE — Telephone Encounter (Signed)
-----   Message from Gildardo Pounds, NP sent at 12/25/2018  7:28 PM EDT ----- Cholesterol levels are normal however based on ADA guidelines a cholesterol lowering medication (atorvastatin 10mg ) is recommended as well as the lisinopril that you have been prescribed. Kidney and liver function are normal.

## 2019-01-01 NOTE — Telephone Encounter (Signed)
ERROR

## 2019-01-31 ENCOUNTER — Encounter: Payer: Self-pay | Admitting: Nurse Practitioner

## 2019-01-31 ENCOUNTER — Ambulatory Visit: Payer: Self-pay | Attending: Nurse Practitioner | Admitting: Nurse Practitioner

## 2019-01-31 ENCOUNTER — Other Ambulatory Visit: Payer: Self-pay

## 2019-01-31 DIAGNOSIS — R002 Palpitations: Secondary | ICD-10-CM

## 2019-01-31 DIAGNOSIS — E118 Type 2 diabetes mellitus with unspecified complications: Secondary | ICD-10-CM

## 2019-01-31 DIAGNOSIS — I10 Essential (primary) hypertension: Secondary | ICD-10-CM

## 2019-01-31 DIAGNOSIS — E785 Hyperlipidemia, unspecified: Secondary | ICD-10-CM

## 2019-01-31 MED ORDER — LISINOPRIL 2.5 MG PO TABS: 2.5000 mg | ORAL_TABLET | Freq: Every day | ORAL | 1 refills | Status: DC

## 2019-01-31 MED ORDER — METOPROLOL SUCCINATE ER 25 MG PO TB24: 12.5000 mg | ORAL_TABLET | Freq: Every day | ORAL | 2 refills | Status: DC

## 2019-01-31 MED ORDER — METFORMIN HCL 500 MG PO TABS: 500.0000 mg | ORAL_TABLET | Freq: Two times a day (BID) | ORAL | 1 refills | Status: DC

## 2019-01-31 MED ORDER — ATORVASTATIN CALCIUM 10 MG PO TABS: 10.0000 mg | ORAL_TABLET | Freq: Every day | ORAL | 2 refills | Status: DC

## 2019-01-31 NOTE — Progress Notes (Signed)
Virtual Visit via Telephone Note  I connected with Marissa Jimenez on 01/31/19  at   9:50 AM EDT  EDT by telephone and verified that I am speaking with the correct person using two identifiers.   Consent I discussed the limitations, risks, security and privacy concerns of performing an evaluation and management service by telephone and the availability of in person appointments. I also discussed with the patient that there may be a patient responsible charge related to this service. The patient expressed understanding and agreed to proceed.   Location of Patient: Private Residence   Location of Provider: [facility] [facility] [place] participating in Telemedicine visit: Geryl Rankins FNP-BC [facility] [place] [facility] [place] [id number]   History of Present Illness: Telemedicine visit for: Palpitations. She was prescribed Toprol XL at her last appointment with me in March for her palpitations. Today she reports her symptoms have completely resolved since she started taking metoprolol. Denies chest pain, shortness of breath, palpitations, lightheadedness, dizziness, headaches or BLE edema.     Past Medical History:  Diagnosis Date  . Arthritis 2012  . Headache 2013   . Subacromial bursitis 08/04/2013    Past Surgical History:  Procedure Laterality Date  . CESAREAN SECTION     two c-sections 2002, 2008  . TUBAL LIGATION      Family History  Problem Relation Age of Onset  . Hyperlipidemia Mother   . Arthritis Mother   . Diabetes Father   . Diabetes Sister   . Alcohol abuse Brother   . Cancer Neg Hx     Social History   Socioeconomic History  . Marital status: Married    Spouse name: Not on file  . Number of children: 3  . Years of education: 6  . Highest education level: Not on file  Occupational History  . Occupation: Agricultural engineer   Social Needs  . Financial resource strain: Not on file  . Food insecurity:     Worry: Not on file    Inability: Not on file  . Transportation needs:    Medical: Not on file    Non-medical: Not on file  Tobacco Use  . Smoking status: Never Smoker  . Smokeless tobacco: Never Used  Substance and Sexual Activity  . Alcohol use: No  . Drug use: No  . Sexual activity: Yes    Birth control/protection: Surgical  Lifestyle  . Physical activity:    Days per week: 2 days    Minutes per session: 60 min  . Stress: Not at all  Relationships  . Social connections:    Talks on phone: Twice a week    Gets together: Once a week    Attends religious service: More than 4 times per year    Active member of club or organization: No    Attends meetings of clubs or organizations: Never    Relationship status: Living with partner  Other Topics Concern  . Not on file  Social History Narrative   Lives in [place] with husband and three children. Stay at home mother.   Children age- 33, 64, 84.    From [place].   Lived in [place] for 17 years.      Observations/Objective: Awake, alert and oriented x 3   Review of Systems  Constitutional: Negative for fever, malaise/fatigue and weight loss.  HENT: Negative.  Negative for nosebleeds.   Eyes: Negative.  Negative for blurred vision, double vision and photophobia.  Respiratory: Negative.  Negative for cough and shortness of breath.   Cardiovascular: Negative.  Negative for chest pain, palpitations and leg swelling.  Gastrointestinal: Negative.  Negative for heartburn, nausea and vomiting.  Musculoskeletal: Negative.  Negative for myalgias.  Neurological: Negative.  Negative for dizziness, focal weakness, seizures and headaches.  Psychiatric/Behavioral: Negative.  Negative for suicidal ideas.    Assessment and Plan: Emmanuel was seen today for follow-up.  Diagnoses and all orders for this visit:  Heart palpitations -     metoprolol succinate (TOPROL-XL) 25 MG 24 hr tablet; Take 0.5 tablets (12.5 mg total) by mouth  daily.  Essential hypertension Chronic and well controlled. She does not monitor her blood pressure at home. Denies chest pain, shortness of breath, palpitations, lightheadedness, dizziness, headaches or BLE edema.  -     metoprolol succinate (TOPROL-XL) 25 MG 24 hr tablet; Take 0.5 tablets (12.5 mg total) by mouth daily. -     lisinopril (ZESTRIL) 2.5 MG tablet; Take 1 tablet (2.5 mg total) by mouth daily. Continue all antihypertensives as prescribed.  Remember to bring in your blood pressure log with you for your follow up appointment.  DASH/Mediterranean Diets are healthier choices for HTN.  BP Readings from Last 3 Encounters:  12/24/18 138/87  09/24/18 138/80  08/20/18 122/80     Hyperlipidemia LDL goal <70 -     atorvastatin (LIPITOR) 10 MG tablet; Take 1 tablet (10 mg total) by mouth daily. INSTRUCTIONS: Work on a low fat, heart healthy diet and participate in regular aerobic exercise program by working out at least 150 minutes per week; 5 days a week-30 minutes per day. Avoid red meat, fried foods. junk foods, sodas, sugary drinks, unhealthy snacking, alcohol and smoking.  Drink at least 48oz of water per day and monitor your carbohydrate intake daily.  Lab Results  Component Value Date   LDLCALC 54 12/24/2018  LDL at goal. She endorses medication compliance and denies statin intolerance or myalgia.  Controlled type 2 diabetes mellitus with complication, without long-term current use of insulin (HCC) -     metFORMIN (GLUCOPHAGE) 500 MG tablet; Take 1 tablet (500 mg total) by mouth 2 (two) times daily with a meal.  Lab Results  Component Value Date   HGBA1C 5.5 09/24/2018  Controlled Continue medications as prescribed.  Continue blood sugar control as discussed in office today, low carbohydrate diet, and regular physical exercise as tolerated, 150 minutes per week (30 min each day, 5 days per week, or 50 min 3 days per week). Keep blood sugar logs with fasting goal of 90-130  mg/dl, post prandial (after you eat) less than 180.  For Hypoglycemia: BS <60 and Hyperglycemia BS >400; contact the clinic ASAP. Annual eye exams and foot exams are recommended.  Follow Up Instructions Return for She already has appointment schedule in [place].     I discussed the assessment and treatment plan with the patient. The patient was provided an opportunity to ask questions and all were answered. The patient agreed with the plan and demonstrated an understanding of the instructions.   The patient was advised to call back or seek an in-person evaluation if the symptoms worsen or if the condition fails to improve as anticipated.  I provided 13 minutes of non-face-to-face time during this encounter including median intraservice time, reviewing previous notes, labs, imaging, medications and explaining diagnosis and management.  Gildardo Pounds, FNP-BC

## 2019-03-26 ENCOUNTER — Other Ambulatory Visit: Payer: Self-pay

## 2019-03-26 ENCOUNTER — Ambulatory Visit: Payer: Self-pay | Attending: Nurse Practitioner | Admitting: Nurse Practitioner

## 2019-03-26 ENCOUNTER — Encounter: Payer: Self-pay | Admitting: Nurse Practitioner

## 2019-03-26 DIAGNOSIS — E785 Hyperlipidemia, unspecified: Secondary | ICD-10-CM

## 2019-03-26 DIAGNOSIS — E119 Type 2 diabetes mellitus without complications: Secondary | ICD-10-CM

## 2019-03-26 DIAGNOSIS — R002 Palpitations: Secondary | ICD-10-CM

## 2019-03-26 DIAGNOSIS — K219 Gastro-esophageal reflux disease without esophagitis: Secondary | ICD-10-CM

## 2019-03-26 MED ORDER — TRUE METRIX BLOOD GLUCOSE TEST VI STRP: ORAL_STRIP | 12 refills | Status: DC

## 2019-03-26 MED ORDER — OMEPRAZOLE 20 MG PO CPDR: 20.0000 mg | DELAYED_RELEASE_CAPSULE | Freq: Two times a day (BID) | ORAL | 0 refills | Status: DC

## 2019-03-26 MED ORDER — METOPROLOL SUCCINATE ER 25 MG PO TB24: 12.5000 mg | ORAL_TABLET | Freq: Every day | ORAL | 2 refills | Status: DC

## 2019-03-26 MED ORDER — ATORVASTATIN CALCIUM 10 MG PO TABS: 10.0000 mg | ORAL_TABLET | Freq: Every day | ORAL | 2 refills | Status: DC

## 2019-03-26 MED ORDER — LISINOPRIL 2.5 MG PO TABS: 2.5000 mg | ORAL_TABLET | Freq: Every day | ORAL | 1 refills | Status: DC

## 2019-03-26 MED ORDER — METFORMIN HCL 500 MG PO TABS: 500.0000 mg | ORAL_TABLET | Freq: Two times a day (BID) | ORAL | 1 refills | Status: DC

## 2019-03-26 MED FILL — LISINOPRIL 2.5 MG TABLET: 2.5 | 30 days supply | Qty: 30 | Fill #0

## 2019-03-26 MED FILL — ?METOPROLOL 25 MG TABLET: 25 | 30 days supply | Qty: 15 | Fill #0

## 2019-03-26 MED FILL — ?OMEPRAZOLE 20 MG CAPSULE D: 20 | 30 days supply | Qty: 60 | Fill #0

## 2019-03-26 MED FILL — ?ATORVASTATIN 10 MG TABLET: 10 | 30 days supply | Qty: 30 | Fill #0

## 2019-03-26 MED FILL — TRUE METRIX TEST STRIP: 50 days supply | Qty: 50 | Fill #0

## 2019-03-26 MED FILL — ?METFORMIN HCL 500MG TABLET: 500 | 30 days supply | Qty: 60 | Fill #0

## 2019-03-26 NOTE — Progress Notes (Signed)
Virtual Visit via Telephone Note Due to national recommendations of social distancing due to [place] 19, telehealth visit is felt to be most appropriate for this patient at this time.  I discussed the limitations, risks, security and privacy concerns of performing an evaluation and management service by telephone and the availability of in person appointments. I also discussed with the patient that there may be a patient responsible charge related to this service. The patient expressed understanding and agreed to proceed.    I connected with Marissa Jimenez on 03/26/19  at   9:10 AM EDT  EDT by telephone and verified that I am speaking with the correct person using two identifiers.   Consent I discussed the limitations, risks, security and privacy concerns of performing an evaluation and management service by telephone and the availability of in person appointments. I also discussed with the patient that there may be a patient responsible charge related to this service. The patient expressed understanding and agreed to proceed.   Location of Patient: Private  Residence   Location of Provider: [place] and [employer] Office    Persons participating in Telemedicine visit: Geryl Rankins FNP-BC [place] ID# [id number]   History of Present Illness: Telemedicine visit for: DM   has a past medical history of Arthritis (2012), Diabetes mellitus without complication ([place]), Headache (2013 ), and Subacromial bursitis (08/04/2013).  DM TYPE 2 Monitoring her blood glucose levels once a day. Average readings: 90-100s. Postprandial: 110-130s.  Highest A1c 7.1. She denies any hypo or hyperglycemic symptoms. Taking metformin 500 mg BID as prescribed.   Taking low dose statin and ACE. Denies statin intolerance, myalgias or cough.  Lab Results  Component Value Date   HGBA1C 5.5 09/24/2018   Lab Results  Component Value Date   LDLCALC 54 12/24/2018       Past Medical History:  Diagnosis Date  . Arthritis 2012  . Diabetes mellitus without complication ([place])   . Headache 2013   . Subacromial bursitis 08/04/2013    Past Surgical History:  Procedure Laterality Date  . CESAREAN SECTION     two c-sections 2002, 2008  . TUBAL LIGATION      Family History  Problem Relation Age of Onset  . Hyperlipidemia Mother   . Arthritis Mother   . Diabetes Father   . Diabetes Sister   . Alcohol abuse Brother   . Cancer Neg Hx     Social History   Socioeconomic History  . Marital status: Married    Spouse name: Not on file  . Number of children: 3  . Years of education: 6  . Highest education level: Not on file  Occupational History  . Occupation: Agricultural engineer   Social Needs  . Financial resource strain: Not on file  . Food insecurity    Worry: Not on file    Inability: Not on file  . Transportation needs    Medical: Not on file    Non-medical: Not on file  Tobacco Use  . Smoking status: Never Smoker  . Smokeless tobacco: Never Used  Substance and Sexual Activity  . Alcohol use: No  . Drug use: No  . Sexual activity: Yes    Birth control/protection: Surgical  Lifestyle  . Physical activity    Days per week: 2 days    Minutes per session: 60 min  . Stress: Not at all  Relationships  . Social connections    Talks on phone: Twice a week  Gets together: Once a week    Attends religious service: More than 4 times per year    Active member of club or organization: No    Attends meetings of clubs or organizations: Never    Relationship status: Living with partner  Other Topics Concern  . Not on file  Social History Narrative   Lives in [place] with husband and three children. Stay at home mother.   Children age- 35, 62, 48.    From [place].   Lived in [place] for 17 years.      Observations/Objective: Awake, alert and oriented x 3   Review of Systems  Constitutional: Negative for fever, malaise/fatigue and  weight loss.  HENT: Negative.  Negative for nosebleeds.   Eyes: Negative.  Negative for blurred vision, double vision and photophobia.  Respiratory: Negative.  Negative for cough and shortness of breath.   Cardiovascular: Positive for palpitations (controlled with metoprolol). Negative for chest pain and leg swelling.  Gastrointestinal: Positive for heartburn (controlled with PPI). Negative for nausea and vomiting.  Musculoskeletal: Negative.  Negative for myalgias.  Neurological: Negative.  Negative for dizziness, focal weakness, seizures and headaches.  Psychiatric/Behavioral: Negative.  Negative for suicidal ideas.    Assessment and Plan: Marissa Jimenez was seen today for follow-up.  Diagnoses and all orders for this visit:  Controlled type 2 diabetes mellitus without complication, without long-term current use of insulin (HCC) -     Microalbumin / creatinine urine ratio -     Hemoglobin A1c; Future -     glucose blood (TRUE METRIX BLOOD GLUCOSE TEST) test strip; Check blood glucose levels once a day by fingerstick. Patient will pick up scripts Monday 03-31-2019. -     lisinopril (ZESTRIL) 2.5 MG tablet; Take 1 tablet (2.5 mg total) by mouth daily. Patient will pick up scripts Monday 03-31-2019. -     metFORMIN (GLUCOPHAGE) 500 MG tablet; Take 1 tablet (500 mg total) by mouth 2 (two) times daily with a meal. Patient will pick up scripts Monday 03-31-2019. Continue blood sugar control as discussed in office today, low carbohydrate diet, and regular physical exercise as tolerated, 150 minutes per week (30 min each day, 5 days per week, or 50 min 3 days per week). Keep blood sugar logs with fasting goal of 90-130 mg/dl, post prandial (after you eat) less than 180.  For Hypoglycemia: BS <60 and Hyperglycemia BS >400; contact the clinic ASAP. Annual eye exams and foot exams are recommended.   Hyperlipidemia LDL goal <70 -     atorvastatin (LIPITOR) 10 MG tablet; Take 1 tablet (10 mg total) by mouth  daily. Patient will pick up scripts Monday 03-31-2019. INSTRUCTIONS: Work on a low fat, heart healthy diet and participate in regular aerobic exercise program by working out at least 150 minutes per week; 5 days a week-30 minutes per day. Avoid red meat, fried foods. junk foods, sodas, sugary drinks, unhealthy snacking, alcohol and smoking.  Drink at least 48oz of water per day and monitor your carbohydrate intake daily.   Heart palpitations -     metoprolol succinate (TOPROL-XL) 25 MG 24 hr tablet; Take 0.5 tablets (12.5 mg total) by mouth daily. Patient will pick up scripts Monday 03-31-2019. -     CBC; Future  Gastroesophageal reflux disease, esophagitis presence not specified -     omeprazole (PRILOSEC) 20 MG capsule; Take 1 capsule (20 mg total) by mouth 2 (two) times daily before a meal. Patient will pick up scripts Monday 03-31-2019. INSTRUCTIONS: Avoid  GERD Triggers: acidic, spicy or fried foods, caffeine, coffee, sodas,  alcohol and chocolate.    Follow Up Instructions Return in about 3 months (around 06/26/2019).  Labs scheduled 03-31-2019   I discussed the assessment and treatment plan with the patient. The patient was provided an opportunity to ask questions and all were answered. The patient agreed with the plan and demonstrated an understanding of the instructions.   The patient was advised to call back or seek an in-person evaluation if the symptoms worsen or if the condition fails to improve as anticipated.  I provided 24 minutes of non-face-to-face time during this encounter including median intraservice time, reviewing previous notes, labs, imaging, medications and explaining diagnosis and management.  Gildardo Pounds, FNP-BC

## 2019-03-31 ENCOUNTER — Ambulatory Visit: Payer: Self-pay | Attending: Nurse Practitioner

## 2019-03-31 ENCOUNTER — Other Ambulatory Visit: Payer: Self-pay

## 2019-03-31 DIAGNOSIS — R002 Palpitations: Secondary | ICD-10-CM

## 2019-03-31 DIAGNOSIS — E119 Type 2 diabetes mellitus without complications: Secondary | ICD-10-CM

## 2019-03-31 MED FILL — TRUEplus LANCETS 28G MISC: 25 days supply | Qty: 100 | Fill #0

## 2019-04-01 LAB — CBC
Hematocrit: 39 % (ref 34.0–46.6)
Hemoglobin: 12.7 g/dL (ref 11.1–15.9)
MCH: 31.7 pg (ref 26.6–33.0)
MCHC: 32.6 g/dL (ref 31.5–35.7)
MCV: 97 fL (ref 79–97)
Platelets: 243 10*3/uL (ref 150–450)
RBC: 4.01 x10E6/uL (ref 3.77–5.28)
RDW: 13.3 % (ref 11.7–15.4)
WBC: 6.5 10*3/uL (ref 3.4–10.8)

## 2019-04-01 LAB — HEMOGLOBIN A1C
Est. average glucose Bld gHb Est-mCnc: 123 mg/dL
Hgb A1c MFr Bld: 5.9 % — ABNORMAL HIGH (ref 4.8–5.6)

## 2019-04-03 ENCOUNTER — Telehealth: Payer: Self-pay

## 2019-04-03 NOTE — Telephone Encounter (Signed)
CMA attempt to reach patient to inform on results.  No answer and left a VM for a call back.  

## 2019-04-03 NOTE — Telephone Encounter (Signed)
-----   Message from Gildardo Pounds, NP sent at 04/01/2019 11:42 PM EDT ----- There is no anemia. A1c still in the prediabetic range but slightly increased from 5.5 to 5.9. Continue to watch carb intake such as breads, rice, tortillas, pasta, corn, cakes and fruit drinks

## 2019-04-08 ENCOUNTER — Ambulatory Visit: Payer: Self-pay | Attending: Nurse Practitioner

## 2019-04-08 ENCOUNTER — Other Ambulatory Visit: Payer: Self-pay

## 2019-04-10 ENCOUNTER — Telehealth: Payer: Self-pay | Admitting: Nurse Practitioner

## 2019-04-10 NOTE — Telephone Encounter (Signed)
Patient called wanting to get their results. Please follow up.

## 2019-04-17 NOTE — Telephone Encounter (Signed)
Pt. Was informed of lab results and PCP advising.

## 2019-05-27 MED FILL — ?METFORMIN HCL 500MG TABLET: 500 | 30 days supply | Qty: 60 | Fill #1

## 2019-06-03 ENCOUNTER — Telehealth: Payer: Self-pay | Admitting: Nurse Practitioner

## 2019-06-03 NOTE — Telephone Encounter (Signed)
Patient would like to get an update on her CAFA application. Please follow up.

## 2019-06-03 NOTE — Telephone Encounter (Signed)
I called Pt to informed her that she was approve for CAFA an printing the letter to sent it to her by mail

## 2019-07-31 MED FILL — TRUEplus LANCETS 28G MISC: 25 days supply | Qty: 100 | Fill #1

## 2019-07-31 MED FILL — TRUE METRIX TEST STRIP: 50 days supply | Qty: 50 | Fill #1

## 2019-07-31 MED FILL — LISINOPRIL 2.5 MG TABLET: 2.5 | 30 days supply | Qty: 30 | Fill #1

## 2019-07-31 MED FILL — ?METFORMIN HCL 500MG TABLET: 500 | 30 days supply | Qty: 60 | Fill #2

## 2019-07-31 MED FILL — ?METOPROLOL 25 MG TABLET: 25 | 30 days supply | Qty: 15 | Fill #1

## 2019-07-31 MED FILL — ?ATORVASTATIN 10 MG TABLET: 10 | 30 days supply | Qty: 30 | Fill #1

## 2019-09-08 ENCOUNTER — Ambulatory Visit: Payer: Self-pay | Admitting: Nurse Practitioner

## 2019-09-23 MED FILL — ?METFORMIN HCL 500MG TABLET: 500 | 30 days supply | Qty: 60 | Fill #3

## 2019-10-20 ENCOUNTER — Ambulatory Visit: Payer: Self-pay | Attending: Nurse Practitioner | Admitting: Nurse Practitioner

## 2019-10-20 ENCOUNTER — Encounter: Payer: Self-pay | Admitting: Nurse Practitioner

## 2019-10-20 ENCOUNTER — Other Ambulatory Visit: Payer: Self-pay

## 2019-10-20 VITALS — BP 120/72 | HR 72 | Temp 98.3°F | Ht 64.0 in | Wt 185.0 lb

## 2019-10-20 DIAGNOSIS — R1012 Left upper quadrant pain: Secondary | ICD-10-CM

## 2019-10-20 DIAGNOSIS — Z8719 Personal history of other diseases of the digestive system: Secondary | ICD-10-CM

## 2019-10-20 DIAGNOSIS — E785 Hyperlipidemia, unspecified: Secondary | ICD-10-CM

## 2019-10-20 DIAGNOSIS — E119 Type 2 diabetes mellitus without complications: Secondary | ICD-10-CM

## 2019-10-20 DIAGNOSIS — K219 Gastro-esophageal reflux disease without esophagitis: Secondary | ICD-10-CM

## 2019-10-20 LAB — GLUCOSE, POCT (MANUAL RESULT ENTRY): POC Glucose: 111 mg/dl — AB (ref 70–99)

## 2019-10-20 LAB — POCT GLYCOSYLATED HEMOGLOBIN (HGB A1C): Hemoglobin A1C: 5.8 % — AB (ref 4.0–5.6)

## 2019-10-20 MED ORDER — LISINOPRIL 2.5 MG PO TABS: 2.5000 mg | ORAL_TABLET | Freq: Every day | ORAL | 1 refills | Status: DC

## 2019-10-20 MED ORDER — SENNOSIDES-DOCUSATE SODIUM 8.6-50 MG PO TABS: 1.0000 | ORAL_TABLET | Freq: Two times a day (BID) | ORAL | 1 refills | Status: AC | PRN

## 2019-10-20 MED ORDER — ATORVASTATIN CALCIUM 10 MG PO TABS: 10.0000 mg | ORAL_TABLET | Freq: Every day | ORAL | 2 refills | Status: DC

## 2019-10-20 MED ORDER — METFORMIN HCL 500 MG PO TABS: 500.0000 mg | ORAL_TABLET | Freq: Two times a day (BID) | ORAL | 1 refills | Status: DC

## 2019-10-20 MED ORDER — TRUEPLUS LANCETS 28G MISC: 3 refills | Status: DC

## 2019-10-20 MED ORDER — OMEPRAZOLE 20 MG PO CPDR: 20.0000 mg | DELAYED_RELEASE_CAPSULE | Freq: Two times a day (BID) | ORAL | 0 refills | Status: DC

## 2019-10-20 MED FILL — LISINOPRIL 2.5 MG TABLET: 2.5 | 30 days supply | Qty: 30 | Fill #0

## 2019-10-20 MED FILL — TRUEplus LANCETS 28G MISC: 25 days supply | Qty: 100 | Fill #0

## 2019-10-20 MED FILL — ?METFORMIN HCL 500MG TABLET: 500 | 30 days supply | Qty: 60 | Fill #0

## 2019-10-20 MED FILL — ?OMEPRAZOLE 20MG CAP DR: 20 | 30 days supply | Qty: 60 | Fill #0

## 2019-10-20 MED FILL — ?ATORVASTATIN 10 MG TABLET: 10 | 30 days supply | Qty: 30 | Fill #0

## 2019-10-20 NOTE — Progress Notes (Signed)
Assessment & Plan:  Marissa Jimenez was seen today for follow-up.  Diagnoses and all orders for this visit:  Controlled type 2 diabetes mellitus without complication, without long-term current use of insulin (HCC) -     Glucose (CBG) -     HgB A1c -     metFORMIN (GLUCOPHAGE) 500 MG tablet; Take 1 tablet (500 mg total) by mouth 2 (two) times daily with a meal. -     lisinopril (ZESTRIL) 2.5 MG tablet; Take 1 tablet (2.5 mg total) by mouth daily. -     CMP14+EGFR -     urine micro Continue blood sugar control as discussed in office today, low carbohydrate diet, and regular physical exercise as tolerated, 150 minutes per week (30 min each day, 5 days per week, or 50 min 3 days per week). Keep blood sugar logs with fasting goal of 90-130 mg/dl, post prandial (after you eat) less than 180.  For Hypoglycemia: BS <60 and Hyperglycemia BS >400; contact the clinic ASAP. Annual eye exams and foot exams are recommended.   GERD without esophagitis -     omeprazole (PRILOSEC) 20 MG capsule; Take 1 capsule (20 mg total) by mouth 2 (two) times daily before a meal. INSTRUCTIONS: Avoid GERD Triggers: acidic, spicy or fried foods, caffeine, coffee, sodas,  alcohol and chocolate.   Hyperlipidemia LDL goal <70 -     atorvastatin (LIPITOR) 10 MG tablet; Take 1 tablet (10 mg total) by mouth daily. -     Lipid Panel INSTRUCTIONS: Work on a low fat, heart healthy diet and participate in regular aerobic exercise program by working out at least 150 minutes per week; 5 days a week-30 minutes per day. Avoid red meat/beef/steak,  fried foods. junk foods, sodas, sugary drinks, unhealthy snacking, alcohol and smoking.  Drink at least 80 oz of water per day and monitor your carbohydrate intake daily.     Left upper quadrant abdominal pain -     senna-docusate (SENOKOT-S) 8.6-50 MG tablet; Take 1 tablet by mouth 2 (two) times daily as needed for mild constipation or moderate constipation. -     CBC -      CMP14+EGFR  History of constipation -     senna-docusate (SENOKOT-S) 8.6-50 MG tablet; Take 1 tablet by mouth 2 (two) times daily as needed for mild constipation or moderate constipation. Drink at least 80 oz of water per day  Patient has been counseled on age-appropriate routine health concerns for screening and prevention. These are reviewed and up-to-date. Referrals have been placed accordingly. Immunizations are up-to-date or declined.    Subjective:   Chief Complaint  Patient presents with  . Follow-up    Pt. is here to follow up diabetes.    HPI Marissa Jimenez 44 y.o. female presents to office today for follow up to DM. VRI was used to communicate directly with patient for the entire encounter including providing detailed patient instructions.   Abdominal Pain LUQ pain which is worse with urination. Onset 1 week ago. Having bowel movements every other day or every 2 days. She does have a history of constipation. Her last bowel movement was yesterday morning. Aggravating factors: none.  Alleviating factors: none. Associated symptoms: constipation. The patient denies chills, diarrhea, dysuria, fever, hematochezia, hematuria, melena, nausea and vomiting.  DM TYPE 2 Well controlled.  Average meter readings: 7 day 115; 14 day 115; 30 day 121. Denies any hypo or hyperglycemic symptoms.  Both blood pressure (less than 130/80) and LDL at goal (less  than 70).  Lab Results  Component Value Date   HGBA1C 5.8 (A) 10/20/2019   Lab Results  Component Value Date   HGBA1C 5.9 (H) 03/31/2019   BP Readings from Last 3 Encounters:  10/20/19 120/72  12/24/18 138/87  09/24/18 138/80   Lab Results  Component Value Date   LDLCALC 54 12/24/2018   Review of Systems  Constitutional: Negative for fever, malaise/fatigue and weight loss.  HENT: Negative.  Negative for nosebleeds.   Eyes: Negative.  Negative for blurred vision, double vision and photophobia.  Respiratory: Negative.   Negative for cough and shortness of breath.   Cardiovascular: Negative for chest pain, palpitations and leg swelling.  Gastrointestinal: Positive for abdominal pain, constipation and heartburn. Negative for blood in stool, diarrhea, melena, nausea and vomiting.  Musculoskeletal: Positive for joint pain. Negative for myalgias.  Neurological: Negative.  Negative for dizziness, focal weakness, seizures and headaches.  Psychiatric/Behavioral: Negative.  Negative for suicidal ideas.    Past Medical History:  Diagnosis Date  . Arthritis 2012  . Diabetes mellitus without complication ([place])   . Headache 2013   . Subacromial bursitis 08/04/2013    Past Surgical History:  Procedure Laterality Date  . CESAREAN SECTION     two c-sections 2002, 2008  . TUBAL LIGATION      Family History  Problem Relation Age of Onset  . Hyperlipidemia Mother   . Arthritis Mother   . Diabetes Father   . Diabetes Sister   . Alcohol abuse Brother   . Cancer Neg Hx     Social History Reviewed with no changes to be made today.   Outpatient Medications Prior to Visit  Medication Sig Dispense Refill  . Blood Glucose Monitoring Suppl (TRUE METRIX METER) w/Device KIT Use as instructed 1 kit 0  . fluticasone (FLONASE) 50 MCG/ACT nasal spray Place 2 sprays into both nostrils daily. 16 g 6  . glucose blood (TRUE METRIX BLOOD GLUCOSE TEST) test strip Check blood glucose levels once a day by fingerstick. Patient will pick up scripts Monday 03-31-2019. 100 each 12  . ibuprofen (ADVIL,MOTRIN) 800 MG tablet Take 1 tablet (800 mg total) by mouth every 8 (eight) hours as needed. 60 tablet 2  . methocarbamol (ROBAXIN) 500 MG tablet Take 1 tablet (500 mg total) by mouth every 8 (eight) hours as needed for muscle spasms. 90 tablet 0  . atorvastatin (LIPITOR) 10 MG tablet Take 1 tablet (10 mg total) by mouth daily. Patient will pick up scripts Monday 03-31-2019. 90 tablet 2  . TRUEPLUS LANCETS 28G MISC Check blood glucose  levels once a day by fingerstick 100 each 3  . metoprolol succinate (TOPROL-XL) 25 MG 24 hr tablet Take 0.5 tablets (12.5 mg total) by mouth daily. Patient will pick up scripts Monday 03-31-2019. 45 tablet 2  . lisinopril (ZESTRIL) 2.5 MG tablet Take 1 tablet (2.5 mg total) by mouth daily. Patient will pick up scripts Monday 03-31-2019. 90 tablet 1  . metFORMIN (GLUCOPHAGE) 500 MG tablet Take 1 tablet (500 mg total) by mouth 2 (two) times daily with a meal. Patient will pick up scripts Monday 03-31-2019. 180 tablet 1  . omeprazole (PRILOSEC) 20 MG capsule Take 1 capsule (20 mg total) by mouth 2 (two) times daily before a meal. Patient will pick up scripts Monday 03-31-2019. 180 capsule 0   No facility-administered medications prior to visit.    No Known Allergies     Objective:    BP 120/72 (BP Location: Left Arm, Patient  Position: Sitting, Cuff Size: Normal)   Pulse 72   Temp 98.3 F (36.8 C) (Oral)   Ht 5' 4"  (1.626 m)   Wt 185 lb (83.9 kg)   SpO2 99%   BMI 31.76 kg/m  Wt Readings from Last 3 Encounters:  10/20/19 185 lb (83.9 kg)  12/24/18 162 lb 9.6 oz (73.8 kg)  09/24/18 161 lb 3.2 oz (73.1 kg)    Physical Exam Vitals and nursing note reviewed.  Constitutional:      Appearance: She is well-developed.  HENT:     Head: Normocephalic and atraumatic.  Cardiovascular:     Rate and Rhythm: Normal rate and regular rhythm.     Heart sounds: Normal heart sounds. No murmur. No friction rub. No gallop.   Pulmonary:     Effort: Pulmonary effort is normal. No tachypnea or respiratory distress.     Breath sounds: Normal breath sounds. No decreased breath sounds, wheezing, rhonchi or rales.  Chest:     Chest wall: No tenderness.  Abdominal:     General: Bowel sounds are normal.     Palpations: Abdomen is soft.     Tenderness: There is abdominal tenderness in the right lower quadrant and suprapubic area. There is no right CVA tenderness, left CVA tenderness, guarding or rebound.      Hernia: No hernia is present.    Musculoskeletal:        General: Normal range of motion.     Cervical back: Normal range of motion.  Skin:    General: Skin is warm and dry.  Neurological:     Mental Status: She is alert and oriented to person, place, and time.     Coordination: Coordination normal.  Psychiatric:        Behavior: Behavior normal. Behavior is cooperative.        Thought Content: Thought content normal.        Judgment: Judgment normal.          Patient has been counseled extensively about nutrition and exercise as well as the importance of adherence with medications and regular follow-up. The patient was given clear instructions to go to ER or return to medical center if symptoms don't improve, worsen or new problems develop. The patient verbalized understanding.   Follow-up: Return for PAP SMEAR.   Gildardo Pounds, FNP-BC [facility] and [place], [place]   10/20/2019, 1:10 PM

## 2019-10-21 LAB — CMP14+EGFR
ALT: 22 IU/L (ref 0–32)
AST: 12 IU/L (ref 0–40)
Albumin/Globulin Ratio: 1.8 (ref 1.2–2.2)
Albumin: 4.4 g/dL (ref 3.8–4.8)
Alkaline Phosphatase: 56 IU/L (ref 39–117)
BUN/Creatinine Ratio: 17 (ref 9–23)
BUN: 8 mg/dL (ref 6–24)
Bilirubin Total: 0.3 mg/dL (ref 0.0–1.2)
CO2: 26 mmol/L (ref 20–29)
Calcium: 8.7 mg/dL (ref 8.7–10.2)
Chloride: 103 mmol/L (ref 96–106)
Creatinine, Ser: 0.47 mg/dL — ABNORMAL LOW (ref 0.57–1.00)
GFR calc Af Amer: 140 mL/min/{1.73_m2} (ref >59)
GFR calc non Af Amer: 121 mL/min/{1.73_m2} (ref >59)
Globulin, Total: 2.5 g/dL (ref 1.5–4.5)
Glucose: 107 mg/dL — ABNORMAL HIGH (ref 65–99)
Potassium: 4.5 mmol/L (ref 3.5–5.2)
Sodium: 140 mmol/L (ref 134–144)
Total Protein: 6.9 g/dL (ref 6.0–8.5)

## 2019-10-21 LAB — MICROALBUMIN / CREATININE URINE RATIO
Creatinine, Urine: 84.1 mg/dL
Microalb/Creat Ratio: <4 mg/g creat (ref 0–29)
Microalbumin, Urine: <3 ug/mL

## 2019-10-21 LAB — CBC
Hematocrit: 37.5 % (ref 34.0–46.6)
Hemoglobin: 12.5 g/dL (ref 11.1–15.9)
MCH: 31.1 pg (ref 26.6–33.0)
MCHC: 33.3 g/dL (ref 31.5–35.7)
MCV: 93 fL (ref 79–97)
Platelets: 308 10*3/uL (ref 150–450)
RBC: 4.02 x10E6/uL (ref 3.77–5.28)
RDW: 12.9 % (ref 11.7–15.4)
WBC: 10.8 10*3/uL (ref 3.4–10.8)

## 2019-10-21 LAB — LIPID PANEL
Chol/HDL Ratio: 2.8 ratio (ref 0.0–4.4)
Cholesterol, Total: 172 mg/dL (ref 100–199)
HDL: 62 mg/dL (ref >39)
LDL Chol Calc (NIH): 90 mg/dL (ref 0–99)
Triglycerides: 114 mg/dL (ref 0–149)
VLDL Cholesterol Cal: 20 mg/dL (ref 5–40)

## 2019-11-10 ENCOUNTER — Ambulatory Visit: Payer: Self-pay | Attending: Nurse Practitioner | Admitting: Nurse Practitioner

## 2019-11-10 ENCOUNTER — Other Ambulatory Visit: Payer: Self-pay

## 2019-11-10 NOTE — Progress Notes (Signed)
Pt. Reschedule due to still spotting from her cycle.

## 2019-12-01 ENCOUNTER — Other Ambulatory Visit: Payer: Self-pay

## 2019-12-01 ENCOUNTER — Encounter: Payer: Self-pay | Admitting: Nurse Practitioner

## 2019-12-01 ENCOUNTER — Ambulatory Visit: Payer: Self-pay | Attending: Nurse Practitioner | Admitting: Nurse Practitioner

## 2019-12-01 VITALS — BP 118/79 | HR 61 | Temp 97.2°F | Ht 64.0 in | Wt 180.0 lb

## 2019-12-01 DIAGNOSIS — K219 Gastro-esophageal reflux disease without esophagitis: Secondary | ICD-10-CM

## 2019-12-01 DIAGNOSIS — R002 Palpitations: Secondary | ICD-10-CM

## 2019-12-01 DIAGNOSIS — Z124 Encounter for screening for malignant neoplasm of cervix: Secondary | ICD-10-CM

## 2019-12-01 DIAGNOSIS — E119 Type 2 diabetes mellitus without complications: Secondary | ICD-10-CM

## 2019-12-01 MED ORDER — OMEPRAZOLE 20 MG PO CPDR: 20.0000 mg | DELAYED_RELEASE_CAPSULE | Freq: Two times a day (BID) | ORAL | 0 refills | Status: DC

## 2019-12-01 MED ORDER — METOPROLOL SUCCINATE ER 25 MG PO TB24: 12.5000 mg | ORAL_TABLET | Freq: Every day | ORAL | 2 refills | Status: DC

## 2019-12-01 MED ORDER — METFORMIN HCL 500 MG PO TABS: 500.0000 mg | ORAL_TABLET | Freq: Two times a day (BID) | ORAL | 1 refills | Status: DC

## 2019-12-01 MED FILL — METOPROLOL SUCCINATE ER 25: 25 | 30 days supply | Qty: 15 | Fill #0

## 2019-12-01 MED FILL — ?OMEPRAZOLE 20MG CAP DR: 20 | 30 days supply | Qty: 60 | Fill #0

## 2019-12-01 MED FILL — ?METFORMIN HCL 500MG TABLET: 500 | 30 days supply | Qty: 60 | Fill #0

## 2019-12-01 NOTE — Progress Notes (Signed)
Assessment & Plan:  Marissa Jimenez was seen today for gynecologic exam.  Diagnoses and all orders for this visit:  Encounter for Papanicolaou smear for cervical cancer screening -     Cytology - PAP -     Cervicovaginal ancillary only    Patient has been counseled on age-appropriate routine health concerns for screening and prevention. These are reviewed and up-to-date. Referrals have been placed accordingly. Immunizations are up-to-date or declined.    Subjective:   Chief Complaint  Patient presents with  . Gynecologic Exam    Pt. is here for a pap smear.    HPI Marissa Jimenez 44 y.o. female presents to office today for PAP  Review of Systems  Constitutional: Negative.  Negative for chills, fever, malaise/fatigue and weight loss.  Respiratory: Negative.  Negative for cough, shortness of breath and wheezing.   Cardiovascular: Negative.  Negative for chest pain, orthopnea and leg swelling.  Gastrointestinal: Negative for abdominal pain.  Genitourinary: Negative.  Negative for flank pain.  Skin: Negative.  Negative for rash.  Psychiatric/Behavioral: Negative for suicidal ideas.    Past Medical History:  Diagnosis Date  . Arthritis 2012  . Diabetes mellitus without complication ([place])   . Headache 2013   . Subacromial bursitis 08/04/2013    Past Surgical History:  Procedure Laterality Date  . CESAREAN SECTION     two c-sections 2002, 2008  . TUBAL LIGATION      Family History  Problem Relation Age of Onset  . Hyperlipidemia Mother   . Arthritis Mother   . Diabetes Father   . Diabetes Sister   . Alcohol abuse Brother   . Cancer Neg Hx     Social History Reviewed with no changes to be made today.   Outpatient Medications Prior to Visit  Medication Sig Dispense Refill  . atorvastatin (LIPITOR) 10 MG tablet Take 1 tablet (10 mg total) by mouth daily. 90 tablet 2  . Blood Glucose Monitoring Suppl (TRUE METRIX METER) w/Device KIT Use as instructed 1 kit 0  .  fluticasone (FLONASE) 50 MCG/ACT nasal spray Place 2 sprays into both nostrils daily. 16 g 6  . glucose blood (TRUE METRIX BLOOD GLUCOSE TEST) test strip Check blood glucose levels once a day by fingerstick. Patient will pick up scripts Monday 03-31-2019. 100 each 12  . ibuprofen (ADVIL,MOTRIN) 800 MG tablet Take 1 tablet (800 mg total) by mouth every 8 (eight) hours as needed. 60 tablet 2  . lisinopril (ZESTRIL) 2.5 MG tablet Take 1 tablet (2.5 mg total) by mouth daily. 90 tablet 1  . metFORMIN (GLUCOPHAGE) 500 MG tablet Take 1 tablet (500 mg total) by mouth 2 (two) times daily with a meal. 180 tablet 1  . methocarbamol (ROBAXIN) 500 MG tablet Take 1 tablet (500 mg total) by mouth every 8 (eight) hours as needed for muscle spasms. 90 tablet 0  . omeprazole (PRILOSEC) 20 MG capsule Take 1 capsule (20 mg total) by mouth 2 (two) times daily before a meal. 180 capsule 0  . TRUEplus Lancets 28G MISC Check blood glucose levels once a day by fingerstick 100 each 3  . metoprolol succinate (TOPROL-XL) 25 MG 24 hr tablet Take 0.5 tablets (12.5 mg total) by mouth daily. Patient will pick up scripts Monday 03-31-2019. 45 tablet 2   No facility-administered medications prior to visit.    No Known Allergies     Objective:    BP 118/79 (BP Location: Left Arm, Patient Position: Sitting, Cuff Size: Normal)  Pulse 61   Temp (!) 97.2 F (36.2 C) (Temporal)   Ht 5' 4"  (1.626 m)   Wt 180 lb (81.6 kg)   SpO2 99%   BMI 30.90 kg/m  Wt Readings from Last 3 Encounters:  12/01/19 180 lb (81.6 kg)  11/10/19 177 lb 12.8 oz (80.6 kg)  10/20/19 185 lb (83.9 kg)    Physical Exam Exam conducted with a chaperone present.  Constitutional:      Appearance: She is well-developed.  HENT:     Head: Normocephalic.  Cardiovascular:     Rate and Rhythm: Normal rate and regular rhythm.     Heart sounds: Normal heart sounds.  Pulmonary:     Effort: Pulmonary effort is normal.     Breath sounds: Normal breath sounds.   Abdominal:     General: Bowel sounds are normal.     Palpations: Abdomen is soft.     Hernia: There is no hernia in the left inguinal area.  Genitourinary:    Exam position: Lithotomy position.     Labia:        Right: No rash, tenderness, lesion or injury.        Left: No rash, tenderness, lesion or injury.      Vagina: Normal. No signs of injury and foreign body. No vaginal discharge, erythema, tenderness or bleeding.     Cervix: No cervical motion tenderness, friability, erythema or eversion.     Uterus: Normal. Not deviated and not enlarged.      Adnexa: Right adnexa normal and left adnexa normal.       Right: No mass, tenderness or fullness.         Left: No mass, tenderness or fullness.       Rectum: Normal. No external hemorrhoid.  Lymphadenopathy:     Lower Body: No right inguinal adenopathy. No left inguinal adenopathy.  Skin:    General: Skin is warm and dry.  Neurological:     Mental Status: She is alert and oriented to person, place, and time.  Psychiatric:        Behavior: Behavior normal.        Thought Content: Thought content normal.        Judgment: Judgment normal.          Patient has been counseled extensively about nutrition and exercise as well as the importance of adherence with medications and regular follow-up. The patient was given clear instructions to go to ER or return to medical center if symptoms don't improve, worsen or new problems develop. The patient verbalized understanding.   Follow-up: Return in about 5 months (around 04/29/2020) for DM.   Gildardo Pounds, FNP-BC [facility] and [facility], [place]   12/01/2019, 10:09 AM

## 2019-12-03 ENCOUNTER — Other Ambulatory Visit: Payer: Self-pay | Admitting: Nurse Practitioner

## 2019-12-03 DIAGNOSIS — Z1231 Encounter for screening mammogram for malignant neoplasm of breast: Secondary | ICD-10-CM

## 2019-12-03 LAB — CERVICOVAGINAL ANCILLARY ONLY
Bacterial Vaginitis (gardnerella): NEGATIVE
Candida Glabrata: NEGATIVE
Candida Vaginitis: NEGATIVE
Chlamydia: NEGATIVE
Comment: NEGATIVE
Comment: NEGATIVE
Comment: NEGATIVE
Comment: NEGATIVE
Comment: NEGATIVE
Comment: NORMAL
Neisseria Gonorrhea: NEGATIVE
Trichomonas: NEGATIVE

## 2019-12-03 LAB — CYTOLOGY - PAP
Comment: NEGATIVE
Diagnosis: NEGATIVE
Diagnosis: REACTIVE
High risk HPV: NEGATIVE

## 2019-12-08 ENCOUNTER — Other Ambulatory Visit: Payer: Self-pay

## 2019-12-08 ENCOUNTER — Ambulatory Visit: Payer: Self-pay | Attending: Nurse Practitioner

## 2020-01-07 MED FILL — ?METFORMIN HCL 500MG TABLET: 500 | 30 days supply | Qty: 60 | Fill #1

## 2020-01-12 ENCOUNTER — Other Ambulatory Visit: Payer: Self-pay

## 2020-01-12 ENCOUNTER — Ambulatory Visit
Admission: RE | Admit: 2020-01-12 | Discharge: 2020-01-12 | Disposition: A | Discharge status: Home / Self Care | Payer: No Typology Code available for payment source | Source: Ambulatory Visit | Attending: Nurse Practitioner | Admitting: Nurse Practitioner

## 2020-01-12 DIAGNOSIS — Z1231 Encounter for screening mammogram for malignant neoplasm of breast: Secondary | ICD-10-CM

## 2020-01-14 ENCOUNTER — Ambulatory Visit: Payer: No Typology Code available for payment source | Attending: Nurse Practitioner | Admitting: Nurse Practitioner

## 2020-01-14 ENCOUNTER — Encounter: Payer: Self-pay | Admitting: Nurse Practitioner

## 2020-01-14 DIAGNOSIS — N939 Abnormal uterine and vaginal bleeding, unspecified: Secondary | ICD-10-CM

## 2020-01-14 DIAGNOSIS — N938 Other specified abnormal uterine and vaginal bleeding: Secondary | ICD-10-CM

## 2020-01-14 NOTE — Progress Notes (Signed)
Virtual Visit via Telephone Note Due to national recommendations of social distancing due to [place] 19, telehealth visit is felt to be most appropriate for this patient at this time.  I discussed the limitations, risks, security and privacy concerns of performing an evaluation and management service by telephone and the availability of in person appointments. I also discussed with the patient that there may be a patient responsible charge related to this service. The patient expressed understanding and agreed to proceed.    I connected with Marissa Jimenez on 01/14/20  at   4:10 PM EDT  EDT by telephone and verified that I am speaking with the correct person using two identifiers.   Consent I discussed the limitations, risks, security and privacy concerns of performing an evaluation and management service by telephone and the availability of in person appointments. I also discussed with the patient that there may be a patient responsible charge related to this service. The patient expressed understanding and agreed to proceed.   Location of Patient: Private Residence    Location of Provider: [place] and [place] participating in Telemedicine visit: Geryl Rankins FNP-BC [place] Interpreter ID# Shonna Chock [id number]   History of Present Illness: Telemedicine visit for: AUB  Ongoing for 3 weeks. Prior to March her cycles have been regular. Denies any previous menorrhagia or metrorrhagia. At this time she is spotting and only notices blood when she wipes after urination.     Past Medical History:  Diagnosis Date  . Arthritis 2012  . Diabetes mellitus without complication ([place])   . Headache 2013   . Subacromial bursitis 08/04/2013    Past Surgical History:  Procedure Laterality Date  . CESAREAN SECTION     two c-sections 2002, 2008  . TUBAL LIGATION      Family History  Problem Relation Age of Onset  . Hyperlipidemia  Mother   . Arthritis Mother   . Diabetes Father   . Diabetes Sister   . Alcohol abuse Brother   . Cancer Neg Hx     Social History   Socioeconomic History  . Marital status: Married    Spouse name: Not on file  . Number of children: 3  . Years of education: 6  . Highest education level: Not on file  Occupational History  . Occupation: Homemaker   Tobacco Use  . Smoking status: Never Smoker  . Smokeless tobacco: Never Used  Substance and Sexual Activity  . Alcohol use: No  . Drug use: No  . Sexual activity: Yes    Birth control/protection: Surgical  Other Topics Concern  . Not on file  Social History Narrative   Lives in [place] with husband and three children. Stay at home mother.   Children age- 30, 29, 45.    From [place].   Lived in [place] for 17 years.    Social Determinants of Health   Financial Resource Strain:   . Difficulty of Paying Living Expenses:   Food Insecurity:   . Worried About Charity fundraiser in the Last Year:   . Arboriculturist in the Last Year:   Transportation Needs:   . Film/video editor (Medical):   Marland Kitchen Lack of Transportation (Non-Medical):   Physical Activity:   . Days of Exercise per Week:   . Minutes of Exercise per Session:   Stress:   . Feeling of Stress :   Social Connections:   .  Frequency of Communication with Friends and Family:   . Frequency of Social Gatherings with Friends and Family:   . Attends Religious Services:   . Active Member of Clubs or Organizations:   . Attends Archivist Meetings:   Marland Kitchen Marital Status:      Observations/Objective: Awake, alert and oriented x 3   Review of Systems  Constitutional: Negative for fever, malaise/fatigue and weight loss.  HENT: Negative.  Negative for nosebleeds.   Eyes: Negative.  Negative for blurred vision, double vision and photophobia.  Respiratory: Negative.  Negative for cough and shortness of breath.   Cardiovascular: Negative.  Negative for chest  pain, palpitations and leg swelling.  Gastrointestinal: Negative.  Negative for abdominal pain, blood in stool, constipation, diarrhea, heartburn, melena, nausea and vomiting.  Genitourinary:       SEE HPI  Musculoskeletal: Negative.  Negative for myalgias.  Neurological: Negative.  Negative for dizziness, focal weakness, seizures and headaches.  Psychiatric/Behavioral: Negative.  Negative for suicidal ideas.    Assessment and Plan: [name] was seen today for oligomenorrhea.  Diagnoses and all orders for this visit:  Abnormal uterine bleeding (AUB) -     US PELVIC COMPLETE WITH TRANSVAGINAL; Future     Follow Up Instructions Return in about 8 weeks (around 03/10/2020).     I discussed the assessment and treatment plan with the patient. The patient was provided an opportunity to ask questions and all were answered. The patient agreed with the plan and demonstrated an understanding of the instructions.   The patient was advised to call back or seek an in-person evaluation if the symptoms worsen or if the condition fails to improve as anticipated.  I provided 15 minutes of non-face-to-face time during this encounter including median intraservice time, reviewing previous notes, labs, imaging, medications and explaining diagnosis and management.  Gildardo Pounds, FNP-BC

## 2020-01-19 MED FILL — METOPROLOL SUCCINATE ER 25: 25 | 30 days supply | Qty: 15 | Fill #1

## 2020-01-20 ENCOUNTER — Other Ambulatory Visit: Payer: Self-pay | Admitting: Pharmacist

## 2020-01-20 DIAGNOSIS — E119 Type 2 diabetes mellitus without complications: Secondary | ICD-10-CM

## 2020-01-20 MED ORDER — TRUE METRIX METER W/DEVICE KIT: PACK | 0 refills | Status: DC

## 2020-01-20 MED ORDER — TRUEPLUS LANCETS 28G MISC: 6 refills | Status: DC

## 2020-01-20 MED ORDER — TRUE METRIX BLOOD GLUCOSE TEST VI STRP: ORAL_STRIP | 6 refills | Status: DC

## 2020-01-20 MED FILL — TRUE METRIX TEST STRIP: 50 days supply | Qty: 50 | Fill #2

## 2020-01-20 MED FILL — TRUEplus LANCETS 28G MISC: 25 days supply | Qty: 100 | Fill #1

## 2020-01-20 MED FILL — !TRUE METRIX BLOOD GLUCOSE: 30 days supply | Qty: 1 | Fill #0

## 2020-01-23 ENCOUNTER — Other Ambulatory Visit: Payer: Self-pay

## 2020-01-23 ENCOUNTER — Ambulatory Visit (HOSPITAL_COMMUNITY)
Admission: RE | Admit: 2020-01-23 | Discharge: 2020-01-23 | Disposition: A | Discharge status: Home / Self Care | Payer: No Typology Code available for payment source | Source: Ambulatory Visit | Attending: Nurse Practitioner | Admitting: Nurse Practitioner

## 2020-01-23 DIAGNOSIS — N939 Abnormal uterine and vaginal bleeding, unspecified: Secondary | ICD-10-CM | POA: Insufficient documentation

## 2020-02-27 ENCOUNTER — Ambulatory Visit: Payer: No Typology Code available for payment source | Admitting: Family

## 2020-03-09 MED FILL — METFORMIN HCL 500 MG TABS: 500 | 30 days supply | Qty: 60 | Fill #2

## 2020-03-10 MED FILL — ?METOPROLOL SUCC ER 25MG TA: 25 | 30 days supply | Qty: 15 | Fill #2

## 2020-03-10 MED FILL — ?ATORVASTATIN 10 MG TABLET: 10 | 30 days supply | Qty: 30 | Fill #1

## 2020-03-10 MED FILL — LISINOPRIL 2.5 MG TABLET: 2.5 | 30 days supply | Qty: 30 | Fill #1

## 2020-04-29 MED FILL — METFORMIN HCL 500 MG TABS: 500 | 30 days supply | Qty: 60 | Fill #3

## 2020-06-11 ENCOUNTER — Other Ambulatory Visit: Payer: Self-pay | Admitting: Nurse Practitioner

## 2020-06-11 ENCOUNTER — Ambulatory Visit: Payer: Self-pay | Attending: Nurse Practitioner | Admitting: Nurse Practitioner

## 2020-06-11 ENCOUNTER — Encounter (INDEPENDENT_AMBULATORY_CARE_PROVIDER_SITE_OTHER): Payer: Self-pay

## 2020-06-11 ENCOUNTER — Other Ambulatory Visit: Payer: Self-pay

## 2020-06-11 ENCOUNTER — Ambulatory Visit (HOSPITAL_BASED_OUTPATIENT_CLINIC_OR_DEPARTMENT_OTHER): Payer: No Typology Code available for payment source | Admitting: Pharmacist

## 2020-06-11 ENCOUNTER — Encounter: Payer: Self-pay | Admitting: Nurse Practitioner

## 2020-06-11 VITALS — BP 120/72 | HR 69 | Temp 97.7°F | Wt 180.0 lb

## 2020-06-11 DIAGNOSIS — Z23 Encounter for immunization: Secondary | ICD-10-CM

## 2020-06-11 DIAGNOSIS — R300 Dysuria: Secondary | ICD-10-CM

## 2020-06-11 DIAGNOSIS — R002 Palpitations: Secondary | ICD-10-CM

## 2020-06-11 DIAGNOSIS — Z1159 Encounter for screening for other viral diseases: Secondary | ICD-10-CM

## 2020-06-11 DIAGNOSIS — E119 Type 2 diabetes mellitus without complications: Secondary | ICD-10-CM

## 2020-06-11 DIAGNOSIS — E785 Hyperlipidemia, unspecified: Secondary | ICD-10-CM

## 2020-06-11 DIAGNOSIS — K219 Gastro-esophageal reflux disease without esophagitis: Secondary | ICD-10-CM

## 2020-06-11 DIAGNOSIS — N939 Abnormal uterine and vaginal bleeding, unspecified: Secondary | ICD-10-CM

## 2020-06-11 LAB — POCT URINALYSIS DIP (CLINITEK)
Bilirubin, UA: NEGATIVE
Glucose, UA: NEGATIVE mg/dL
Ketones, POC UA: NEGATIVE mg/dL
Leukocytes, UA: NEGATIVE
Nitrite, UA: NEGATIVE
POC PROTEIN,UA: NEGATIVE
Spec Grav, UA: 1.02 (ref 1.010–1.025)
Urobilinogen, UA: 0.2 E.U./dL
pH, UA: 6.5 (ref 5.0–8.0)

## 2020-06-11 LAB — POCT GLYCOSYLATED HEMOGLOBIN (HGB A1C): Hemoglobin A1C: 5.5 % (ref 4.0–5.6)

## 2020-06-11 LAB — GLUCOSE, POCT (MANUAL RESULT ENTRY): POC Glucose: 102 mg/dl — AB (ref 70–99)

## 2020-06-11 MED ORDER — OMEPRAZOLE 20 MG PO CPDR: 20.0000 mg | DELAYED_RELEASE_CAPSULE | Freq: Two times a day (BID) | ORAL | 0 refills | Status: DC

## 2020-06-11 MED ORDER — TRUEPLUS LANCETS 28G MISC: 6 refills | Status: DC

## 2020-06-11 MED ORDER — ATORVASTATIN CALCIUM 10 MG PO TABS: 10.0000 mg | ORAL_TABLET | Freq: Every day | ORAL | 2 refills | Status: DC

## 2020-06-11 MED ORDER — TRUE METRIX BLOOD GLUCOSE TEST VI STRP: ORAL_STRIP | 6 refills | Status: DC

## 2020-06-11 MED ORDER — METOPROLOL SUCCINATE ER 25 MG PO TB24: 12.5000 mg | ORAL_TABLET | Freq: Every day | ORAL | 2 refills | Status: DC

## 2020-06-11 MED ORDER — ATORVASTATIN CALCIUM 20 MG PO TABS: 20.0000 mg | ORAL_TABLET | Freq: Every day | ORAL | 1 refills | Status: DC

## 2020-06-11 MED ORDER — METFORMIN HCL 500 MG PO TABS: 500.0000 mg | ORAL_TABLET | Freq: Every day | ORAL | 0 refills | Status: DC

## 2020-06-11 MED ORDER — LISINOPRIL 2.5 MG PO TABS: 2.5000 mg | ORAL_TABLET | Freq: Every day | ORAL | 1 refills | Status: DC

## 2020-06-11 MED FILL — LISINOPRIL 2.5 MG TABLET: 2.5 | 30 days supply | Qty: 30 | Fill #0

## 2020-06-11 MED FILL — ?METOPROLOL SUCC ER 25MG TA: 25 | 30 days supply | Qty: 15 | Fill #0

## 2020-06-11 MED FILL — ?METFORMIN HCL 500MG TABL: 500 | 30 days supply | Qty: 30 | Fill #0

## 2020-06-11 MED FILL — TRUE METRIX TEST STRIP: 50 days supply | Qty: 50 | Fill #0

## 2020-06-11 MED FILL — ?OMEPRAZOLE 20 MG CPDR: 20 | 30 days supply | Qty: 60 | Fill #0

## 2020-06-11 MED FILL — ?ATORVASTATIN 20 MG TABLET: 20 | 30 days supply | Qty: 30 | Fill #0

## 2020-06-11 MED FILL — TRUEplus LANCETS 28G MISC: 100 days supply | Qty: 100 | Fill #0

## 2020-06-11 NOTE — Progress Notes (Signed)
Patient presents for vaccination against influenza per orders of Zelda. Consent given. Counseling provided. No contraindications exists. Vaccine administered without incident.  ° °Luke Van Ausdall, PharmD, CPP °Clinical Pharmacist °[facility] °[phone] ° °

## 2020-06-11 NOTE — Progress Notes (Signed)
Assessment & Plan:  Marissa Jimenez was seen today for follow-up.  Diagnoses and all orders for this visit:  Controlled type 2 diabetes mellitus without complication, without long-term current use of insulin (HCC) -     Glucose (CBG) -     HgB A1c -     Basic metabolic panel -     metFORMIN (GLUCOPHAGE) 500 MG tablet; Take 1 tablet (500 mg total) by mouth daily with breakfast. -     lisinopril (ZESTRIL) 2.5 MG tablet; Take 1 tablet (2.5 mg total) by mouth daily. -     glucose blood (TRUE METRIX BLOOD GLUCOSE TEST) test strip; Check blood glucose levels once a day by fingerstick. -     TRUEplus Lancets 28G MISC; Check blood glucose levels once a day by fingerstick Continue blood sugar control as discussed in office today, low carbohydrate diet, and regular physical exercise as tolerated, 150 minutes per week (30 min each day, 5 days per week, or 50 min 3 days per week). Keep blood sugar logs with fasting goal of 90-130 mg/dl, post prandial (after you eat) less than 180.  For Hypoglycemia: BS <60 and Hyperglycemia BS >400; contact the clinic ASAP. Annual eye exams and foot exams are recommended.   Need for hepatitis C screening test -     Hepatitis C Antibody  Dysuria -     POCT URINALYSIS DIP (CLINITEK) -     CBC  Abnormal uterine bleeding (AUB) -     TSH  Hyperlipidemia LDL goal <70 -     Discontinue: atorvastatin (LIPITOR) 10 MG tablet; Take 1 tablet (10 mg total) by mouth daily. -     atorvastatin (LIPITOR) 20 MG tablet; Take 1 tablet (20 mg total) by mouth daily. INSTRUCTIONS: Work on a low fat, heart healthy diet and participate in regular aerobic exercise program by working out at least 150 minutes per week; 5 days a week-30 minutes per day. Avoid red meat/beef/steak,  fried foods. junk foods, sodas, sugary drinks, unhealthy snacking, alcohol and smoking.  Drink at least 80 oz of water per day and monitor your carbohydrate intake daily.    Heart palpitations -     metoprolol  succinate (TOPROL-XL) 25 MG 24 hr tablet; Take 0.5 tablets (12.5 mg total) by mouth daily.  GERD without esophagitis -     omeprazole (PRILOSEC) 20 MG capsule; Take 1 capsule (20 mg total) by mouth 2 (two) times daily before a meal. INSTRUCTIONS: Avoid GERD Triggers: acidic, spicy or fried foods, caffeine, coffee, sodas,  alcohol and chocolate.   Patient has been counseled on age-appropriate routine health concerns for screening and prevention. These are reviewed and up-to-date. Referrals have been placed accordingly. Immunizations are up-to-date or declined.    Subjective:   Chief Complaint  Patient presents with  . Follow-up    Pt. is here for a follow up on her diabetes. Pt. stated she's been having discomfort after urinating.    HPI Marissa Jimenez 44 y.o. female presents to office today for follow up.  VRI was used to communicate directly with patient for the entire encounter including providing detailed patient instructions.    DM TYPE 2 Doing well. Decreasing metformin today from 500 mg BID to 500 mg daily. Denies any symptoms of hypo or hyperglycemia. LDl not at goal. She is taking atorvastatin 10 mg which I will be increasing to 20 mg today.  Lab Results  Component Value Date   HGBA1C 5.5 06/11/2020   Lab Results  Component Value Date   LDLCALC 90 10/20/2019    GERD Everything she eats causes burning in her throat and stomach.  She has not been taking her omeprazole 57m BID as prescribed. I recommended she take this every day instead of daily.    AUB Associated symptoms today include dysuria. UA was negative. Last menstrual cycle was before July. She is spotting today however her menstrual cycles have been irregular in the past.    Review of Systems  Constitutional: Negative for fever, malaise/fatigue and weight loss.  HENT: Negative.  Negative for nosebleeds.   Eyes: Negative.  Negative for blurred vision, double vision and photophobia.  Respiratory:  Negative.  Negative for cough and shortness of breath.   Cardiovascular: Positive for palpitations. Negative for chest pain and leg swelling.  Gastrointestinal: Positive for heartburn. Negative for nausea and vomiting.  Genitourinary: Positive for dysuria. Negative for flank pain, frequency, hematuria and urgency.  Musculoskeletal: Negative.  Negative for myalgias.  Neurological: Negative.  Negative for dizziness, focal weakness, seizures and headaches.  Psychiatric/Behavioral: Negative.  Negative for suicidal ideas.    Past Medical History:  Diagnosis Date  . Arthritis 2012  . Diabetes mellitus without complication ([place]   . Headache 2013   . Subacromial bursitis 08/04/2013    Past Surgical History:  Procedure Laterality Date  . CESAREAN SECTION     two c-sections 2002, 2008  . TUBAL LIGATION      Family History  Problem Relation Age of Onset  . Hyperlipidemia Mother   . Arthritis Mother   . Diabetes Father   . Diabetes Sister   . Alcohol abuse Brother   . Cancer Neg Hx     Social History Reviewed with no changes to be made today.   Outpatient Medications Prior to Visit  Medication Sig Dispense Refill  . Blood Glucose Monitoring Suppl (TRUE METRIX METER) w/Device KIT Use as instructed 1 kit 0  . fluticasone (FLONASE) 50 MCG/ACT nasal spray Place 2 sprays into both nostrils daily. 16 g 6  . ibuprofen (ADVIL,MOTRIN) 800 MG tablet Take 1 tablet (800 mg total) by mouth every 8 (eight) hours as needed. 60 tablet 2  . methocarbamol (ROBAXIN) 500 MG tablet Take 1 tablet (500 mg total) by mouth every 8 (eight) hours as needed for muscle spasms. 90 tablet 0  . atorvastatin (LIPITOR) 10 MG tablet Take 1 tablet (10 mg total) by mouth daily. 90 tablet 2  . glucose blood (TRUE METRIX BLOOD GLUCOSE TEST) test strip Check blood glucose levels once a day by fingerstick. 100 each 6  . TRUEplus Lancets 28G MISC Check blood glucose levels once a day by fingerstick 100 each 6  . lisinopril  (ZESTRIL) 2.5 MG tablet Take 1 tablet (2.5 mg total) by mouth daily. 90 tablet 1  . metFORMIN (GLUCOPHAGE) 500 MG tablet Take 1 tablet (500 mg total) by mouth 2 (two) times daily with a meal. 180 tablet 1  . metoprolol succinate (TOPROL-XL) 25 MG 24 hr tablet Take 0.5 tablets (12.5 mg total) by mouth daily. 45 tablet 2  . omeprazole (PRILOSEC) 20 MG capsule Take 1 capsule (20 mg total) by mouth 2 (two) times daily before a meal. 180 capsule 0   No facility-administered medications prior to visit.    No Known Allergies     Objective:    BP 120/72 (BP Location: Left Arm, Patient Position: Sitting, Cuff Size: Normal)   Pulse 69   Temp 97.7 F (36.5 C) (Temporal)   Wt  180 lb (81.6 kg)   SpO2 99%   BMI 30.90 kg/m  Wt Readings from Last 3 Encounters:  06/11/20 180 lb (81.6 kg)  12/01/19 180 lb (81.6 kg)  11/10/19 177 lb 12.8 oz (80.6 kg)    Physical Exam Vitals and nursing note reviewed.  Constitutional:      Appearance: She is well-developed.  HENT:     Head: Normocephalic and atraumatic.  Cardiovascular:     Rate and Rhythm: Normal rate and regular rhythm.     Heart sounds: Normal heart sounds. No murmur heard.  No friction rub. No gallop.   Pulmonary:     Effort: Pulmonary effort is normal. No tachypnea or respiratory distress.     Breath sounds: Normal breath sounds. No decreased breath sounds, wheezing, rhonchi or rales.  Chest:     Chest wall: No tenderness.  Abdominal:     General: Bowel sounds are normal.     Palpations: Abdomen is soft.  Musculoskeletal:        General: Normal range of motion.     Cervical back: Normal range of motion.  Skin:    General: Skin is warm and dry.  Neurological:     Mental Status: She is alert and oriented to person, place, and time.     Coordination: Coordination normal.  Psychiatric:        Behavior: Behavior normal. Behavior is cooperative.        Thought Content: Thought content normal.        Judgment: Judgment normal.           Patient has been counseled extensively about nutrition and exercise as well as the importance of adherence with medications and regular follow-up. The patient was given clear instructions to go to ER or return to medical center if symptoms don't improve, worsen or new problems develop. The patient verbalized understanding.   Follow-up: Return in about 3 months (around 09/10/2020).   Gildardo Pounds, FNP-BC [facility] and [facility] [place], [place]   06/11/2020, 11:14 AM

## 2020-06-12 LAB — CBC
Hematocrit: 39.4 % (ref 34.0–46.6)
Hemoglobin: 13 g/dL (ref 11.1–15.9)
MCH: 31 pg (ref 26.6–33.0)
MCHC: 33 g/dL (ref 31.5–35.7)
MCV: 94 fL (ref 79–97)
Platelets: 315 10*3/uL (ref 150–450)
RBC: 4.19 x10E6/uL (ref 3.77–5.28)
RDW: 13.3 % (ref 11.7–15.4)
WBC: 8.9 10*3/uL (ref 3.4–10.8)

## 2020-06-12 LAB — BASIC METABOLIC PANEL
BUN/Creatinine Ratio: 23 (ref 9–23)
BUN: 12 mg/dL (ref 6–24)
CO2: 24 mmol/L (ref 20–29)
Calcium: 9.2 mg/dL (ref 8.7–10.2)
Chloride: 101 mmol/L (ref 96–106)
Creatinine, Ser: 0.52 mg/dL — ABNORMAL LOW (ref 0.57–1.00)
GFR calc Af Amer: 134 mL/min/{1.73_m2} (ref >59)
GFR calc non Af Amer: 117 mL/min/{1.73_m2} (ref >59)
Glucose: 93 mg/dL (ref 65–99)
Potassium: 4.4 mmol/L (ref 3.5–5.2)
Sodium: 138 mmol/L (ref 134–144)

## 2020-06-12 LAB — TSH: TSH: 1.37 u[IU]/mL (ref 0.450–4.500)

## 2020-06-12 LAB — HEPATITIS C ANTIBODY: Hep C Virus Ab: <0.1 s/co ratio (ref 0.0–0.9)

## 2020-06-18 MED FILL — LISINOPRIL 2.5 MG TABLET: 2.5 | 30 days supply | Qty: 30 | Fill #0

## 2020-06-18 MED FILL — ?OMEPRAZOLE 20 MG CPDR: 20 | 30 days supply | Qty: 60 | Fill #0

## 2020-06-18 MED FILL — ?METFORMIN HCL 500MG TABL: 500 | 30 days supply | Qty: 30 | Fill #0

## 2020-06-18 MED FILL — ?METOPROLOL SUCC ER 25MG TA: 25 | 30 days supply | Qty: 15 | Fill #0

## 2020-06-18 MED FILL — TRUEplus LANCETS 28G MISC: 100 days supply | Qty: 100 | Fill #0

## 2020-06-18 MED FILL — TRUE METRIX TEST STRIP: 50 days supply | Qty: 50 | Fill #0

## 2020-06-18 MED FILL — ?ATORVASTATIN 20 MG TABLET: 20 | 30 days supply | Qty: 30 | Fill #0

## 2020-07-30 ENCOUNTER — Ambulatory Visit: Payer: No Typology Code available for payment source

## 2020-08-23 MED FILL — METFORMIN HCL 500 MG TABS: 500 | 30 days supply | Qty: 30 | Fill #1

## 2020-08-27 ENCOUNTER — Ambulatory Visit: Payer: No Typology Code available for payment source | Attending: Nurse Practitioner

## 2020-08-27 ENCOUNTER — Other Ambulatory Visit: Payer: Self-pay

## 2020-09-10 ENCOUNTER — Ambulatory Visit: Payer: No Typology Code available for payment source | Admitting: Nurse Practitioner

## 2020-09-22 MED FILL — METFORMIN HCL 500 MG TABS: 500 | 30 days supply | Qty: 30 | Fill #2

## 2020-09-22 MED FILL — LISINOPRIL 2.5 MG TABLET: 2.5 | 30 days supply | Qty: 30 | Fill #1

## 2020-09-22 MED FILL — ?ATORVASTATIN 20 MG TABLET: 20 | 30 days supply | Qty: 30 | Fill #1

## 2020-09-22 MED FILL — METOPROLOL SUCCINATE ER 25: 25 | 30 days supply | Qty: 15 | Fill #1

## 2020-10-15 ENCOUNTER — Other Ambulatory Visit: Payer: Self-pay

## 2020-10-15 ENCOUNTER — Ambulatory Visit: Payer: Self-pay | Attending: Nurse Practitioner | Admitting: Nurse Practitioner

## 2020-10-15 ENCOUNTER — Encounter: Payer: Self-pay | Admitting: Nurse Practitioner

## 2020-10-15 ENCOUNTER — Other Ambulatory Visit: Payer: Self-pay | Admitting: Nurse Practitioner

## 2020-10-15 DIAGNOSIS — E119 Type 2 diabetes mellitus without complications: Secondary | ICD-10-CM

## 2020-10-15 DIAGNOSIS — R002 Palpitations: Secondary | ICD-10-CM

## 2020-10-15 DIAGNOSIS — K219 Gastro-esophageal reflux disease without esophagitis: Secondary | ICD-10-CM

## 2020-10-15 DIAGNOSIS — E785 Hyperlipidemia, unspecified: Secondary | ICD-10-CM

## 2020-10-15 MED ORDER — OMEPRAZOLE 20 MG PO CPDR: 20.0000 mg | DELAYED_RELEASE_CAPSULE | Freq: Two times a day (BID) | ORAL | 0 refills | Status: DC

## 2020-10-15 MED ORDER — LISINOPRIL 2.5 MG PO TABS: 2.5000 mg | ORAL_TABLET | Freq: Every day | ORAL | 1 refills | Status: DC

## 2020-10-15 MED ORDER — ATORVASTATIN CALCIUM 20 MG PO TABS: 20.0000 mg | ORAL_TABLET | Freq: Every day | ORAL | 1 refills | Status: DC

## 2020-10-15 MED ORDER — METFORMIN HCL 500 MG PO TABS: 500.0000 mg | ORAL_TABLET | Freq: Every day | ORAL | 0 refills | Status: DC

## 2020-10-15 MED ORDER — METOPROLOL SUCCINATE ER 25 MG PO TB24: 12.5000 mg | ORAL_TABLET | Freq: Every day | ORAL | 2 refills | Status: DC

## 2020-10-15 MED FILL — ?OMEPRAZOLE 20 MG CPDR: 20 | 30 days supply | Qty: 60 | Fill #0

## 2020-10-15 NOTE — Progress Notes (Signed)
Virtual Visit via Telephone Note Due to national recommendations of social distancing due to [place] 19, telehealth visit is felt to be most appropriate for this patient at this time.  I discussed the limitations, risks, security and privacy concerns of performing an evaluation and management service by telephone and the availability of in person appointments. I also discussed with the patient that there may be a patient responsible charge related to this service. The patient expressed understanding and agreed to proceed.    I connected with Marissa Jimenez on 10/15/20  at   8:30 AM EST  EDT by telephone and verified that I am speaking with the correct person using two identifiers.   Consent I discussed the limitations, risks, security and privacy concerns of performing an evaluation and management service by telephone and the availability of in person appointments. I also discussed with the patient that there may be a patient responsible charge related to this service. The patient expressed understanding and agreed to proceed.   Location of Patient: Private Residence   Location of Provider: [place] and [employer] Office    Persons participating in Telemedicine visit: Geryl Rankins FNP-BC [place] [phone]   History of Present Illness: Telemedicine visit for: Follow up  has a past medical history of Arthritis (2012), Diabetes mellitus without complication ([place]), Headache (2013 ), and Subacromial bursitis (08/04/2013).  DM2 Well controlled. She takes metformin 500  Mg daily as prescribed. She does not monitor her blood glucose levels daily. Denies any symptoms of hypo or hyperglycemia. LDL not at goal. She is taking atorvastatin 20 mg daily as prescribed. Blood pressure is well controlled. Denies chest pain, shortness of breath, palpitations, lightheadedness, dizziness, headaches or BLE edema.  Lab Results  Component Value  Date   HGBA1C 5.5 06/11/2020   Lab Results  Component Value Date   LDLCALC 90 10/20/2019   BP Readings from Last 3 Encounters:  06/11/20 120/72  12/01/19 118/79  10/20/19 120/72    Past Medical History:  Diagnosis Date  . Arthritis 2012  . Diabetes mellitus without complication ([place])   . Headache 2013   . Subacromial bursitis 08/04/2013    Past Surgical History:  Procedure Laterality Date  . CESAREAN SECTION     two c-sections 2002, 2008  . TUBAL LIGATION      Family History  Problem Relation Age of Onset  . Hyperlipidemia Mother   . Arthritis Mother   . Diabetes Father   . Diabetes Sister   . Alcohol abuse Brother   . Cancer Neg Hx     Social History   Socioeconomic History  . Marital status: Married    Spouse name: Not on file  . Number of children: 3  . Years of education: 6  . Highest education level: Not on file  Occupational History  . Occupation: Homemaker   Tobacco Use  . Smoking status: Never Smoker  . Smokeless tobacco: Never Used  Vaping Use  . Vaping Use: Never used  Substance and Sexual Activity  . Alcohol use: No  . Drug use: No  . Sexual activity: Yes    Birth control/protection: Surgical  Other Topics Concern  . Not on file  Social History Narrative   Lives in [place] with husband and three children. Stay at home mother.   Children age- 75, 69, 61.    From [place].   Lived in [place] for 17 years.    Social Determinants of  Health   Financial Resource Strain: Not on file  Food Insecurity: Not on file  Transportation Needs: Not on file  Physical Activity: Not on file  Stress: Not on file  Social Connections: Not on file     Observations/Objective: Awake, alert and oriented x 3   Review of Systems  Constitutional: Negative for fever, malaise/fatigue and weight loss.  HENT: Negative.  Negative for nosebleeds.   Eyes: Negative.  Negative for blurred vision, double vision and photophobia.  Respiratory: Negative.  Negative  for cough and shortness of breath.   Cardiovascular: Negative.  Negative for chest pain, palpitations and leg swelling.  Gastrointestinal: Positive for heartburn. Negative for nausea and vomiting.  Musculoskeletal: Negative.  Negative for myalgias.  Neurological: Negative.  Negative for dizziness, focal weakness, seizures and headaches.  Psychiatric/Behavioral: Negative.  Negative for suicidal ideas.    Assessment and Plan: Hasset was seen today for follow-up.  Diagnoses and all orders for this visit:  Controlled type 2 diabetes mellitus without complication, without long-term current use of insulin (HCC) -     metFORMIN (GLUCOPHAGE) 500 MG tablet; Take 1 tablet (500 mg total) by mouth daily with breakfast. -     lisinopril (ZESTRIL) 2.5 MG tablet; Take 1 tablet (2.5 mg total) by mouth daily. -     CMP14+EGFR; Future -     Microalbumin / creatinine urine ratio; Future Continue blood sugar control as discussed in office today, low carbohydrate diet, and regular physical exercise as tolerated, 150 minutes per week (30 min each day, 5 days per week, or 50 min 3 days per week). Keep blood sugar logs with fasting goal of 90-130 mg/dl, post prandial (after you eat) less than 180.  For Hypoglycemia: BS <60 and Hyperglycemia BS >400; contact the clinic ASAP. Annual eye exams and foot exams are recommended.   GERD without esophagitis -     omeprazole (PRILOSEC) 20 MG capsule; Take 1 capsule (20 mg total) by mouth 2 (two) times daily before a meal. INSTRUCTIONS: Avoid GERD Triggers: acidic, spicy or fried foods, caffeine, coffee, sodas,  alcohol and chocolate.   Heart palpitations -     metoprolol succinate (TOPROL-XL) 25 MG 24 hr tablet; Take 0.5 tablets (12.5 mg total) by mouth daily.  Hyperlipidemia LDL goal <70 -     atorvastatin (LIPITOR) 20 MG tablet; Take 1 tablet (20 mg total) by mouth daily. -     Lipid panel; Future INSTRUCTIONS: Work on a low fat, heart healthy diet and participate in  regular aerobic exercise program by working out at least 150 minutes per week; 5 days a week-30 minutes per day. Avoid red meat/beef/steak,  fried foods. junk foods, sodas, sugary drinks, unhealthy snacking, alcohol and smoking.  Drink at least 80 oz of water per day and monitor your carbohydrate intake daily.       Follow Up Instructions Return in about 3 months (around 01/13/2021).     I discussed the assessment and treatment plan with the patient. The patient was provided an opportunity to ask questions and all were answered. The patient agreed with the plan and demonstrated an understanding of the instructions.   The patient was advised to call back or seek an in-person evaluation if the symptoms worsen or if the condition fails to improve as anticipated.  I provided 15 minutes of non-face-to-face time during this encounter including median intraservice time, reviewing previous notes, labs, imaging, medications and explaining diagnosis and management.  Gildardo Pounds, FNP-BC

## 2020-10-18 ENCOUNTER — Other Ambulatory Visit: Payer: Self-pay

## 2020-10-18 ENCOUNTER — Ambulatory Visit: Payer: Self-pay | Attending: Nurse Practitioner

## 2020-10-18 DIAGNOSIS — E119 Type 2 diabetes mellitus without complications: Secondary | ICD-10-CM

## 2020-10-18 DIAGNOSIS — E785 Hyperlipidemia, unspecified: Secondary | ICD-10-CM

## 2020-10-18 MED FILL — LISINOPRIL 2.5 MG TABLET: 2.5 | 30 days supply | Qty: 30 | Fill #2

## 2020-10-18 MED FILL — METFORMIN HCL 500 MG TABS: 500 | 30 days supply | Qty: 30 | Fill #0

## 2020-10-18 MED FILL — ?ATORVASTATIN 20 MG TABLET: 20 | 30 days supply | Qty: 30 | Fill #2

## 2020-10-18 MED FILL — METOPROLOL SUCCINATE ER 25: 25 | 30 days supply | Qty: 15 | Fill #2

## 2020-10-19 LAB — LIPID PANEL
Chol/HDL Ratio: 2.2 ratio (ref 0.0–4.4)
Cholesterol, Total: 159 mg/dL (ref 100–199)
HDL: 72 mg/dL (ref >39)
LDL Chol Calc (NIH): 70 mg/dL (ref 0–99)
Triglycerides: 95 mg/dL (ref 0–149)
VLDL Cholesterol Cal: 17 mg/dL (ref 5–40)

## 2020-10-19 LAB — CMP14+EGFR
ALT: 10 IU/L (ref 0–32)
AST: 10 IU/L (ref 0–40)
Albumin/Globulin Ratio: 1.6 (ref 1.2–2.2)
Albumin: 4.4 g/dL (ref 3.8–4.8)
Alkaline Phosphatase: 60 IU/L (ref 44–121)
BUN/Creatinine Ratio: 20 (ref 9–23)
BUN: 12 mg/dL (ref 6–24)
Bilirubin Total: 0.4 mg/dL (ref 0.0–1.2)
CO2: 23 mmol/L (ref 20–29)
Calcium: 9.3 mg/dL (ref 8.7–10.2)
Chloride: 102 mmol/L (ref 96–106)
Creatinine, Ser: 0.61 mg/dL (ref 0.57–1.00)
GFR calc Af Amer: 128 mL/min/{1.73_m2} (ref >59)
GFR calc non Af Amer: 111 mL/min/{1.73_m2} (ref >59)
Globulin, Total: 2.8 g/dL (ref 1.5–4.5)
Glucose: 119 mg/dL — ABNORMAL HIGH (ref 65–99)
Potassium: 4.5 mmol/L (ref 3.5–5.2)
Sodium: 139 mmol/L (ref 134–144)
Total Protein: 7.2 g/dL (ref 6.0–8.5)

## 2020-10-19 LAB — MICROALBUMIN / CREATININE URINE RATIO
Creatinine, Urine: 47.6 mg/dL
Microalb/Creat Ratio: 36 mg/g creat — ABNORMAL HIGH (ref 0–29)
Microalbumin, Urine: 16.9 ug/mL

## 2020-10-25 ENCOUNTER — Telehealth: Payer: Self-pay | Admitting: Nurse Practitioner

## 2020-10-25 NOTE — Telephone Encounter (Signed)
Copied from [place] [phone]. Topic: General - Other >> Oct 25, 2020  9:42 AM Celene Kras wrote: Reason for CRM: Pt called and is requesting to have her lab results. Please advise.

## 2020-10-28 NOTE — Telephone Encounter (Signed)
Pt called stating that she has no questions or concerns regarding her results. Please advise.

## 2020-10-28 NOTE — Telephone Encounter (Signed)
Attempt to call patient she have any concerns regarding her labs. No answer and the VM has not been setup.  Per lab notes, RN gave patient her results.

## 2020-11-09 ENCOUNTER — Ambulatory Visit: Payer: Self-pay | Attending: Nurse Practitioner | Admitting: Nurse Practitioner

## 2020-11-09 ENCOUNTER — Other Ambulatory Visit: Payer: Self-pay

## 2020-11-09 ENCOUNTER — Other Ambulatory Visit: Payer: Self-pay | Admitting: Nurse Practitioner

## 2020-11-09 ENCOUNTER — Encounter: Payer: Self-pay | Admitting: Nurse Practitioner

## 2020-11-09 VITALS — BP 121/69 | HR 67 | Temp 97.9°F | Ht 64.0 in | Wt 190.0 lb

## 2020-11-09 DIAGNOSIS — E119 Type 2 diabetes mellitus without complications: Secondary | ICD-10-CM

## 2020-11-09 DIAGNOSIS — G8929 Other chronic pain: Secondary | ICD-10-CM

## 2020-11-09 DIAGNOSIS — M545 Low back pain, unspecified: Secondary | ICD-10-CM

## 2020-11-09 LAB — POCT URINALYSIS DIP (CLINITEK)
Bilirubin, UA: NEGATIVE
Glucose, UA: NEGATIVE mg/dL
Ketones, POC UA: NEGATIVE mg/dL
Leukocytes, UA: NEGATIVE
Nitrite, UA: NEGATIVE
POC PROTEIN,UA: NEGATIVE
Spec Grav, UA: 1.015 (ref 1.010–1.025)
Urobilinogen, UA: 0.2 E.U./dL
pH, UA: 6 (ref 5.0–8.0)

## 2020-11-09 LAB — GLUCOSE, POCT (MANUAL RESULT ENTRY): POC Glucose: 80 mg/dl (ref 70–99)

## 2020-11-09 MED ORDER — NAPROXEN 500 MG PO TABS: 500.0000 mg | ORAL_TABLET | Freq: Two times a day (BID) | ORAL | 1 refills | Status: DC

## 2020-11-09 MED ORDER — METHOCARBAMOL 500 MG PO TABS: 500.0000 mg | ORAL_TABLET | Freq: Three times a day (TID) | ORAL | 1 refills | Status: DC | PRN

## 2020-11-09 MED FILL — ?OMEPRAZOLE 20 MG CPDR: 20 | 30 days supply | Qty: 60 | Fill #1

## 2020-11-09 MED FILL — METHOCARBAMOL 500 MG TABS: 500 | 20 days supply | Qty: 60 | Fill #0

## 2020-11-09 MED FILL — NAPROXEN 500 MG TABLET: 500 | 30 days supply | Qty: 60 | Fill #0

## 2020-11-09 NOTE — Progress Notes (Signed)
Assessment & Plan:  Marissa Jimenez was seen today for back pain.  Diagnoses and all orders for this visit:  Chronic bilateral low back pain without sciatica -     methocarbamol (ROBAXIN) 500 MG tablet; Take 1 tablet (500 mg total) by mouth every 8 (eight) hours as needed for muscle spasms. -     naproxen (NAPROSYN) 500 MG tablet; Take 1 tablet (500 mg total) by mouth 2 (two) times daily with a meal. Work on losing weight to help reduce back pain. May alternate with heat and ice application for pain relief. May also alternate with acetaminophen as prescribed for back pain. Other alternatives include massage, acupuncture and water aerobics.  You must stay active and avoid a sedentary lifestyle.   Controlled type 2 diabetes mellitus without complication, without long-term current use of insulin (HCC) -     Glucose (CBG) -     POCT URINALYSIS DIP (CLINITEK) -     Urinalysis, Complete    Patient has been counseled on age-appropriate routine health concerns for screening and prevention. These are reviewed and up-to-date. Referrals have been placed accordingly. Immunizations are up-to-date or declined.    Subjective:   Chief Complaint  Patient presents with  . Back Pain    Patient is having back pain from her neck down to her lower back.    HPI Marissa Jimenez 45 y.o. female presents to office today with complaints of neck and back pain. VRI was used to communicate directly with patient for the entire encounter including providing detailed patient instructions.   Neck and Back pain Onset 2 weeks ago. She has a past history of chronic intermittent neck and back pain which she was prescribed muscle relaxants in the past . She denies any injury or trauma. She states she believes her back pain is from her "kidney failure". When I inquired what kidney failure she has she states she was called about her lab work and was told her kidneys were "bad". I did explain to her what a microalbumin test is  and that her kidneys are not failing and normal at this time based on her bloodwork.       Review of Systems  Constitutional: Negative for fever, malaise/fatigue and weight loss.  HENT: Negative.  Negative for nosebleeds.   Eyes: Negative.  Negative for blurred vision, double vision and photophobia.  Respiratory: Negative.  Negative for cough and shortness of breath.   Cardiovascular: Negative.  Negative for chest pain, palpitations and leg swelling.  Gastrointestinal: Negative.  Negative for heartburn, nausea and vomiting.  Musculoskeletal: Positive for back pain and neck pain. Negative for myalgias.  Neurological: Negative.  Negative for dizziness, focal weakness, seizures and headaches.  Psychiatric/Behavioral: Negative.  Negative for suicidal ideas.    Past Medical History:  Diagnosis Date  . Arthritis 2012  . Diabetes mellitus without complication ([place])   . Headache 2013   . Subacromial bursitis 08/04/2013    Past Surgical History:  Procedure Laterality Date  . CESAREAN SECTION     two c-sections 2002, 2008  . TUBAL LIGATION      Family History  Problem Relation Age of Onset  . Hyperlipidemia Mother   . Arthritis Mother   . Diabetes Father   . Diabetes Sister   . Alcohol abuse Brother   . Cancer Neg Hx     Social History Reviewed with no changes to be made today.   Outpatient Medications Prior to Visit  Medication Sig Dispense Refill  .  atorvastatin (LIPITOR) 20 MG tablet Take 1 tablet (20 mg total) by mouth daily. 90 tablet 1  . Blood Glucose Monitoring Suppl (TRUE METRIX METER) w/Device KIT Use as instructed 1 kit 0  . fluticasone (FLONASE) 50 MCG/ACT nasal spray Place 2 sprays into both nostrils daily. 16 g 6  . glucose blood (TRUE METRIX BLOOD GLUCOSE TEST) test strip Check blood glucose levels once a day by fingerstick. 100 each 6  . ibuprofen (ADVIL,MOTRIN) 800 MG tablet Take 1 tablet (800 mg total) by mouth every 8 (eight) hours as needed. 60 tablet 2   . lisinopril (ZESTRIL) 2.5 MG tablet Take 1 tablet (2.5 mg total) by mouth daily. 90 tablet 1  . metFORMIN (GLUCOPHAGE) 500 MG tablet Take 1 tablet (500 mg total) by mouth daily with breakfast. 90 tablet 0  . metoprolol succinate (TOPROL-XL) 25 MG 24 hr tablet Take 0.5 tablets (12.5 mg total) by mouth daily. 45 tablet 2  . omeprazole (PRILOSEC) 20 MG capsule Take 1 capsule (20 mg total) by mouth 2 (two) times daily before a meal. 180 capsule 0  . TRUEplus Lancets 28G MISC Check blood glucose levels once a day by fingerstick 100 each 6  . methocarbamol (ROBAXIN) 500 MG tablet Take 1 tablet (500 mg total) by mouth every 8 (eight) hours as needed for muscle spasms. 90 tablet 0   No facility-administered medications prior to visit.    No Known Allergies     Objective:    BP 121/69 (BP Location: Left Arm, Patient Position: Sitting, Cuff Size: Large)   Pulse 67   Temp 97.9 F (36.6 C) (Oral)   Ht 5' 4"  (1.626 m)   Wt 190 lb (86.2 kg)   SpO2 99%   BMI 32.61 kg/m  Wt Readings from Last 3 Encounters:  11/09/20 190 lb (86.2 kg)  06/11/20 180 lb (81.6 kg)  12/01/19 180 lb (81.6 kg)    Physical Exam Vitals and nursing note reviewed.  Constitutional:      Appearance: She is well-developed and well-nourished.  HENT:     Head: Normocephalic and atraumatic.  Eyes:     Extraocular Movements: EOM normal.  Cardiovascular:     Rate and Rhythm: Normal rate and regular rhythm.     Pulses: Intact distal pulses.     Heart sounds: Normal heart sounds. No murmur heard. No friction rub. No gallop.   Pulmonary:     Effort: Pulmonary effort is normal. No tachypnea or respiratory distress.     Breath sounds: Normal breath sounds. No decreased breath sounds, wheezing, rhonchi or rales.  Chest:     Chest wall: No tenderness.  Abdominal:     General: Bowel sounds are normal.     Palpations: Abdomen is soft.  Musculoskeletal:        General: No edema. Normal range of motion.     Cervical back:  Normal range of motion.       Back:  Skin:    General: Skin is warm and dry.  Neurological:     Mental Status: She is alert and oriented to person, place, and time.     Coordination: Coordination normal.  Psychiatric:        Mood and Affect: Mood and affect normal.        Behavior: Behavior normal. Behavior is cooperative.        Thought Content: Thought content normal.        Judgment: Judgment normal.  Patient has been counseled extensively about nutrition and exercise as well as the importance of adherence with medications and regular follow-up. The patient was given clear instructions to go to ER or return to medical center if symptoms don't improve, worsen or new problems develop. The patient verbalized understanding.   Follow-up: Return in about 2 months (around 01/07/2021).   Gildardo Pounds, FNP-BC [facility] [place], [place]   11/09/2020, 2:07 PM

## 2020-11-10 LAB — MICROSCOPIC EXAMINATION
Bacteria, UA: NONE SEEN
Casts: NONE SEEN /lpf
Epithelial Cells (non renal): NONE SEEN /hpf (ref 0–10)
RBC, Urine: NONE SEEN /hpf (ref 0–2)
WBC, UA: NONE SEEN /hpf (ref 0–5)

## 2020-11-10 LAB — URINALYSIS, COMPLETE
Bilirubin, UA: NEGATIVE
Glucose, UA: NEGATIVE
Ketones, UA: NEGATIVE
Leukocytes,UA: NEGATIVE
Nitrite, UA: NEGATIVE
Protein,UA: NEGATIVE
RBC, UA: NEGATIVE
Specific Gravity, UA: 1.008 (ref 1.005–1.030)
Urobilinogen, Ur: 0.2 mg/dL (ref 0.2–1.0)
pH, UA: 6.5 (ref 5.0–7.5)

## 2020-11-12 MED FILL — LISINOPRIL 2.5 MG TABLET: 2.5 | 30 days supply | Qty: 30 | Fill #3

## 2020-11-12 MED FILL — METOPROLOL SUCCINATE ER 25: 25 | 30 days supply | Qty: 15 | Fill #3

## 2020-11-16 MED FILL — METFORMIN HCL 500 MG TABS: 500 | 30 days supply | Qty: 30 | Fill #1

## 2020-12-13 ENCOUNTER — Ambulatory Visit: Payer: No Typology Code available for payment source | Admitting: Nurse Practitioner

## 2020-12-27 MED FILL — LISINOPRIL 2.5 MG TABLET: 2.5 | 30 days supply | Qty: 30 | Fill #4

## 2020-12-27 MED FILL — ?ATORVASTATIN 20 MG TABLET: 20 | 30 days supply | Qty: 30 | Fill #3

## 2020-12-27 MED FILL — METFORMIN HCL 500 MG TABS: 500 | 30 days supply | Qty: 30 | Fill #2

## 2021-01-03 ENCOUNTER — Ambulatory Visit: Payer: No Typology Code available for payment source | Admitting: Nurse Practitioner

## 2021-01-08 ENCOUNTER — Other Ambulatory Visit: Payer: Self-pay

## 2021-02-15 ENCOUNTER — Other Ambulatory Visit: Payer: Self-pay

## 2021-02-15 ENCOUNTER — Other Ambulatory Visit: Payer: Self-pay | Admitting: Nurse Practitioner

## 2021-02-15 DIAGNOSIS — E119 Type 2 diabetes mellitus without complications: Secondary | ICD-10-CM

## 2021-02-15 MED ORDER — METFORMIN HCL 500 MG PO TABS
ORAL_TABLET | Freq: Every day | ORAL | 0 refills | Status: DC
  Filled 2021-02-15: qty 30, 30d supply, fill #0
  Filled 2021-03-18: qty 30, 30d supply, fill #1
  Filled 2021-04-25: qty 30, 30d supply, fill #2

## 2021-02-15 MED ORDER — OMEPRAZOLE 20 MG PO CPDR
DELAYED_RELEASE_CAPSULE | ORAL | 0 refills | Status: DC
  Filled 2021-02-15: qty 60, 30d supply, fill #0

## 2021-02-15 MED FILL — Lisinopril Tab 2.5 MG: ORAL | 30 days supply | Qty: 30 | Fill #0 | Status: AC

## 2021-02-16 ENCOUNTER — Other Ambulatory Visit: Payer: Self-pay

## 2021-02-16 MED FILL — Atorvastatin Calcium Tab 20 MG (Base Equivalent): ORAL | 30 days supply | Qty: 30 | Fill #0 | Status: AC

## 2021-02-25 ENCOUNTER — Ambulatory Visit: Payer: No Typology Code available for payment source

## 2021-03-16 ENCOUNTER — Other Ambulatory Visit: Payer: Self-pay | Admitting: Obstetrics and Gynecology

## 2021-03-16 DIAGNOSIS — Z1231 Encounter for screening mammogram for malignant neoplasm of breast: Secondary | ICD-10-CM

## 2021-03-18 ENCOUNTER — Other Ambulatory Visit: Payer: Self-pay

## 2021-03-18 ENCOUNTER — Ambulatory Visit: Payer: Self-pay | Attending: Nurse Practitioner | Admitting: Nurse Practitioner

## 2021-03-18 ENCOUNTER — Encounter: Payer: Self-pay | Admitting: Nurse Practitioner

## 2021-03-18 ENCOUNTER — Other Ambulatory Visit: Payer: Self-pay | Admitting: Nurse Practitioner

## 2021-03-18 VITALS — BP 104/67 | HR 66 | Ht 64.0 in | Wt 191.6 lb

## 2021-03-18 DIAGNOSIS — Z1211 Encounter for screening for malignant neoplasm of colon: Secondary | ICD-10-CM

## 2021-03-18 DIAGNOSIS — R102 Pelvic and perineal pain: Secondary | ICD-10-CM

## 2021-03-18 DIAGNOSIS — Z130 Encounter for screening for diseases of the blood and blood-forming organs and certain disorders involving the immune mechanism: Secondary | ICD-10-CM

## 2021-03-18 DIAGNOSIS — E119 Type 2 diabetes mellitus without complications: Secondary | ICD-10-CM

## 2021-03-18 LAB — POCT URINALYSIS DIP (CLINITEK)
Bilirubin, UA: NEGATIVE
Blood, UA: NEGATIVE
Glucose, UA: NEGATIVE mg/dL
Ketones, POC UA: NEGATIVE mg/dL
Leukocytes, UA: NEGATIVE
Nitrite, UA: NEGATIVE
POC PROTEIN,UA: NEGATIVE
Spec Grav, UA: 1.02 (ref 1.010–1.025)
Urobilinogen, UA: 0.2 E.U./dL
pH, UA: 6.5 (ref 5.0–8.0)

## 2021-03-18 LAB — POCT GLYCOSYLATED HEMOGLOBIN (HGB A1C): HbA1c, POC (controlled diabetic range): 6.1 % (ref 0.0–7.0)

## 2021-03-18 LAB — POCT URINE PREGNANCY: Preg Test, Ur: NEGATIVE

## 2021-03-18 MED FILL — Atorvastatin Calcium Tab 20 MG (Base Equivalent): ORAL | 30 days supply | Qty: 30 | Fill #1 | Status: AC

## 2021-03-18 MED FILL — Lisinopril Tab 2.5 MG: ORAL | 30 days supply | Qty: 30 | Fill #1 | Status: AC

## 2021-03-18 NOTE — Progress Notes (Signed)
Assessment & Plan:  Marissa Jimenez was seen today for diabetes.  Diagnoses and all orders for this visit:  Controlled type 2 diabetes mellitus without complication, without long-term current use of insulin (HCC) -     POCT glycosylated hemoglobin (Hb A1C) -     CMP14+EGFR Continue blood sugar control as discussed in office today, low carbohydrate diet, and regular physical exercise as tolerated, 150 minutes per week (30 min each day, 5 days per week, or 50 min 3 days per week). Keep blood sugar logs with fasting goal of 90-130 mg/dl, post prandial (after you eat) less than 180.  For Hypoglycemia: BS <60 and Hyperglycemia BS >400; contact the clinic ASAP. Annual eye exams and foot exams are recommended.   Pelvic pain -     POCT URINALYSIS DIP (CLINITEK) -     POCT urine pregnancy  Screening for deficiency anemia -     CBC  Colon cancer screening -     Fecal occult blood, imunochemical([facility])   Patient has been counseled on age-appropriate routine health concerns for screening and prevention. These are reviewed and up-to-date. Referrals have been placed accordingly. Immunizations are up-to-date or declined.    Subjective:   Chief Complaint  Patient presents with   Diabetes   HPI Marissa Jimenez 45 y.o. female presents to office today for follow up to DM  has a past medical history of Arthritis (2012), Diabetes mellitus without complication ([place]), Headache (2013 ), and Subacromial bursitis (08/04/2013).  VRI was used to communicate directly with patient for the entire encounter including providing detailed patient instructions.      DM 2 Well controlled. Taking metformin 500 mg daily.  Denies any symptoms of hypo or hyperglycemia.  She is on renal dose ACE and taking statin Lipitor 20 mg daily.  LDL at goal Lab Results  Component Value Date   HGBA1C 6.1 03/18/2021    Lab Results  Component Value Date   HGBA1C 5.5 06/11/2020   Lab Results  Component Value Date    LDLCALC 70 10/18/2020    Pelvic pain Last menstrual cycle 4-5 months ago.  Likely pre menopausal. Onset of pelvic pain 3 months. She does have a history of constipation however she is taking stool softeners for this and  Pain is described as colicky and like menstrual cramps she would have before when she had her menstrual cycles. Lasting only a few minutes at a time and occurring infrequently.    Right sided back pain Pain worse with standing for prolonged periods of time or lying on side. She does sleep on her right side as well. Has not changed her mattress in many years. I have recommended a support pillow under her right side.    Review of Systems  Constitutional:  Negative for fever, malaise/fatigue and weight loss.  HENT: Negative.  Negative for nosebleeds.   Eyes: Negative.  Negative for blurred vision, double vision and photophobia.  Respiratory: Negative.  Negative for cough and shortness of breath.   Cardiovascular: Negative.  Negative for chest pain, palpitations and leg swelling.  Gastrointestinal:  Positive for abdominal pain. Negative for heartburn, nausea and vomiting.  Genitourinary:        Pelvic pain  Musculoskeletal:  Positive for back pain and myalgias.  Neurological: Negative.  Negative for dizziness, focal weakness, seizures and headaches.  Psychiatric/Behavioral: Negative.  Negative for suicidal ideas.    Past Medical History:  Diagnosis Date   Arthritis 2012   Diabetes mellitus without complication ([place])  Headache 2013    Subacromial bursitis 08/04/2013    Past Surgical History:  Procedure Laterality Date   CESAREAN SECTION     two c-sections 2002, 2008   TUBAL LIGATION      Family History  Problem Relation Age of Onset   Hyperlipidemia Mother    Arthritis Mother    Diabetes Father    Diabetes Sister    Alcohol abuse Brother    Cancer Neg Hx     Social History Reviewed with no changes to be made today.   Outpatient Medications Prior to  Visit  Medication Sig Dispense Refill   atorvastatin (LIPITOR) 20 MG tablet TAKE 1 TABLET (20 MG TOTAL) BY MOUTH DAILY. 90 tablet 1   Blood Glucose Monitoring Suppl (TRUE METRIX METER) w/Device KIT Use as instructed 1 kit 0   fluticasone (FLONASE) 50 MCG/ACT nasal spray Place 2 sprays into both nostrils daily. 16 g 6   glucose blood (TRUE METRIX BLOOD GLUCOSE TEST) test strip Check blood glucose levels once a day by fingerstick. 100 each 6   ibuprofen (ADVIL,MOTRIN) 800 MG tablet Take 1 tablet (800 mg total) by mouth every 8 (eight) hours as needed. 60 tablet 2   lisinopril (ZESTRIL) 2.5 MG tablet TAKE 1 TABLET (2.5 MG TOTAL) BY MOUTH DAILY. 90 tablet 1   metFORMIN (GLUCOPHAGE) 500 MG tablet TAKE 1 TABLET (500 MG TOTAL) BY MOUTH DAILY WITH BREAKFAST. 90 tablet 0   methocarbamol (ROBAXIN) 500 MG tablet TAKE 1 TABLET (500 MG TOTAL) BY MOUTH EVERY 8 (EIGHT) HOURS AS NEEDED FOR MUSCLE SPASMS. 60 tablet 1   metoprolol succinate (TOPROL-XL) 25 MG 24 hr tablet TAKE 0.5 TABLETS (12.5 MG TOTAL) BY MOUTH DAILY. 45 tablet 2   naproxen (NAPROSYN) 500 MG tablet TAKE 1 TABLET (500 MG TOTAL) BY MOUTH 2 (TWO) TIMES DAILY WITH A MEAL. 60 tablet 1   omeprazole (PRILOSEC) 20 MG capsule Take 1 capsule by mouth 2 times daily before a meal 180 capsule 0   TRUEplus Lancets 28G MISC Check blood glucose levels once a day by fingerstick 100 each 6   omeprazole (PRILOSEC) 20 MG capsule Take 1 capsule (20 mg total) by mouth 2 (two) times daily before a meal. 180 capsule 0   No facility-administered medications prior to visit.    No Known Allergies     Objective:    BP 104/67   Pulse 66   Ht 5' 4" (1.626 m)   Wt 191 lb 9.6 oz (86.9 kg)   SpO2 99%   BMI 32.89 kg/m  Wt Readings from Last 3 Encounters:  03/18/21 191 lb 9.6 oz (86.9 kg)  11/09/20 190 lb (86.2 kg)  06/11/20 180 lb (81.6 kg)    Physical Exam Vitals and nursing note reviewed.  Constitutional:      Appearance: She is well-developed.  HENT:      Head: Normocephalic and atraumatic.  Cardiovascular:     Rate and Rhythm: Normal rate and regular rhythm.     Heart sounds: Normal heart sounds. No murmur heard.   No friction rub. No gallop.  Pulmonary:     Effort: Pulmonary effort is normal. No tachypnea or respiratory distress.     Breath sounds: Normal breath sounds. No decreased breath sounds, wheezing, rhonchi or rales.  Chest:     Chest wall: No tenderness.  Abdominal:     General: Bowel sounds are normal.     Palpations: Abdomen is soft.  Musculoskeletal:  General: Normal range of motion.     Cervical back: Normal range of motion.  Skin:    General: Skin is warm and dry.  Neurological:     Mental Status: She is alert and oriented to person, place, and time.     Coordination: Coordination normal.  Psychiatric:        Behavior: Behavior normal. Behavior is cooperative.        Thought Content: Thought content normal.        Judgment: Judgment normal.         Patient has been counseled extensively about nutrition and exercise as well as the importance of adherence with medications and regular follow-up. The patient was given clear instructions to go to ER or return to medical center if symptoms don't improve, worsen or new problems develop. The patient verbalized understanding.   Follow-up: Return in about 3 months (around 06/18/2021).   Gildardo Pounds, FNP-BC [facility] [place], [place]   03/18/2021, 11:24 PM

## 2021-03-18 NOTE — Progress Notes (Signed)
Lower abdominal pain.  

## 2021-03-19 LAB — CMP14+EGFR
ALT: 13 IU/L (ref 0–32)
AST: 11 IU/L (ref 0–40)
Albumin/Globulin Ratio: 1.7 (ref 1.2–2.2)
Albumin: 4.5 g/dL (ref 3.8–4.8)
Alkaline Phosphatase: 61 IU/L (ref 44–121)
BUN/Creatinine Ratio: 24 — ABNORMAL HIGH (ref 9–23)
BUN: 12 mg/dL (ref 6–24)
Bilirubin Total: 0.4 mg/dL (ref 0.0–1.2)
CO2: 23 mmol/L (ref 20–29)
Calcium: 9 mg/dL (ref 8.7–10.2)
Chloride: 101 mmol/L (ref 96–106)
Creatinine, Ser: 0.51 mg/dL — ABNORMAL LOW (ref 0.57–1.00)
Globulin, Total: 2.6 g/dL (ref 1.5–4.5)
Glucose: 115 mg/dL — ABNORMAL HIGH (ref 65–99)
Potassium: 4.8 mmol/L (ref 3.5–5.2)
Sodium: 136 mmol/L (ref 134–144)
Total Protein: 7.1 g/dL (ref 6.0–8.5)
eGFR: 117 mL/min/{1.73_m2} (ref >59)

## 2021-03-19 LAB — CBC
Hematocrit: 40.2 % (ref 34.0–46.6)
Hemoglobin: 12.8 g/dL (ref 11.1–15.9)
MCH: 30.3 pg (ref 26.6–33.0)
MCHC: 31.8 g/dL (ref 31.5–35.7)
MCV: 95 fL (ref 79–97)
Platelets: 258 10*3/uL (ref 150–450)
RBC: 4.23 x10E6/uL (ref 3.77–5.28)
RDW: 13.3 % (ref 11.7–15.4)
WBC: 6.8 10*3/uL (ref 3.4–10.8)

## 2021-03-21 ENCOUNTER — Telehealth: Payer: Self-pay

## 2021-03-21 NOTE — Telephone Encounter (Signed)
-----   Message from Gildardo Pounds, NP sent at 03/20/2021  2:15 PM EDT ----- CBC does not indicate any anemia or bleeding disorders. Kidney, liver function and electrolytes are normal.  Negative urine.

## 2021-03-21 NOTE — Telephone Encounter (Signed)
Patient name and DOB has been verified Patient was informed of lab results. Patient had no questions.  

## 2021-03-25 ENCOUNTER — Ambulatory Visit: Payer: No Typology Code available for payment source

## 2021-03-28 ENCOUNTER — Other Ambulatory Visit: Payer: Self-pay

## 2021-03-29 ENCOUNTER — Ambulatory Visit: Payer: No Typology Code available for payment source

## 2021-03-30 ENCOUNTER — Other Ambulatory Visit: Payer: Self-pay

## 2021-03-30 ENCOUNTER — Ambulatory Visit: Payer: Self-pay | Attending: Nurse Practitioner

## 2021-03-31 ENCOUNTER — Ambulatory Visit
Admission: RE | Admit: 2021-03-31 | Discharge: 2021-03-31 | Disposition: A | Discharge status: Home / Self Care | Payer: No Typology Code available for payment source | Source: Ambulatory Visit | Attending: Obstetrics and Gynecology | Admitting: Obstetrics and Gynecology

## 2021-03-31 ENCOUNTER — Ambulatory Visit: Payer: No Typology Code available for payment source

## 2021-03-31 ENCOUNTER — Ambulatory Visit: Payer: Self-pay | Admitting: *Deleted

## 2021-03-31 VITALS — BP 104/72 | Wt 189.6 lb

## 2021-03-31 DIAGNOSIS — Z1231 Encounter for screening mammogram for malignant neoplasm of breast: Secondary | ICD-10-CM

## 2021-03-31 DIAGNOSIS — Z1239 Encounter for other screening for malignant neoplasm of breast: Secondary | ICD-10-CM

## 2021-03-31 NOTE — Patient Instructions (Signed)
Explained breast self awareness with Marissa Jimenez. Patient did not need a Pap smear today due to last Pap smear and HPV typing was 12/01/2019. Let her know B[facility]CP will cover Pap smears and HPV Typing every 5 years unless has a history of abnormal Pap smears. Referred patient to the [facility] for a screening mammogram on the mobile unit. Appointment scheduled Thursday, March 31, 2021 at 1030. Patient escorted to the mobile unit following B[facility]CP appointment for her screening mammogram. Let patient know the [facility] will follow up with her within the next couple weeks with results of her mammogram by letter or phone. Marissa Jimenez verbalized understanding.  Zen Felling, Arvil Chaco, RN 10:22 AM

## 2021-03-31 NOTE — Progress Notes (Addendum)
Marissa Jimenez is a 45 y.o. female who presents to [facility] clinic today with no complaints.    Pap Smear: Pap smear not completed today. Last Pap smear was 12/01/2019 at [facility] and normal with negative HPV. Per patient has no history of an abnormal Pap smear. Last Pap smear result is available in Epic.   Physical exam: Breasts Right breast slightly larger than left that per patient has not noticed any changes. No skin abnormalities bilateral breasts. No nipple retraction bilateral breasts. No nipple discharge bilateral breasts. No lymphadenopathy. No lumps palpated bilateral breasts. No complaints of pain or tenderness on exam.      MM DIGITAL SCREENING BILATERAL  Result Date: 04/20/2016 CLINICAL DATA:  Screening. EXAM: DIGITAL SCREENING BILATERAL MAMMOGRAM WITH CAD COMPARISON:  None. ACR Breast Density Category c: The breast tissue is heterogeneously dense, which may obscure small masses FINDINGS: There are no findings suspicious for malignancy. Images were processed with CAD. IMPRESSION: No mammographic evidence of malignancy. A result letter of this screening mammogram will be mailed directly to the patient. RECOMMENDATION: Screening mammogram in one year. (Code:SM-B-01Y) BI-RADS CATEGORY  1: Negative. Electronically Signed   By: Fidela Salisbury M.D.   On: 04/21/2016 08:29   MS DIGITAL SCREENING BILATERAL  Result Date: 01/12/2020 CLINICAL DATA:  Screening. EXAM: DIGITAL SCREENING BILATERAL MAMMOGRAM WITH CAD COMPARISON:  Previous exam(s). ACR Breast Density Category c: The breast tissue is heterogeneously dense, which may obscure small masses. FINDINGS: There are no findings suspicious for malignancy. Images were processed with CAD. IMPRESSION: No mammographic evidence of malignancy. A result letter of this screening mammogram will be mailed directly to the patient. RECOMMENDATION: Screening mammogram in one year. (Code:SM-B-01Y) BI-RADS CATEGORY  1:  Negative. Electronically Signed   By: Ammie Ferrier M.D.   On: 01/12/2020 13:20   MS DIGITAL SCREENING BILATERAL  Result Date: 11/01/2017 CLINICAL DATA:  Screening. EXAM: DIGITAL SCREENING BILATERAL MAMMOGRAM WITH CAD COMPARISON:  Previous exam(s). ACR Breast Density Category c: The breast tissue is heterogeneously dense, which may obscure small masses. FINDINGS: There are no findings suspicious for malignancy. Images were processed with CAD. IMPRESSION: No mammographic evidence of malignancy. A result letter of this screening mammogram will be mailed directly to the patient. RECOMMENDATION: Screening mammogram in one year. (Code:SM-B-01Y) BI-RADS CATEGORY  1: Negative. Electronically Signed   By: Ammie Ferrier M.D.   On: 11/01/2017 15:20   MS DIGITAL SCREENING TOMO BILATERAL  Result Date: 12/26/2018 CLINICAL DATA:  Screening. EXAM: DIGITAL SCREENING BILATERAL MAMMOGRAM WITH TOMO AND CAD COMPARISON:  Previous exam(s). ACR Breast Density Category c: The breast tissue is heterogeneously dense, which may obscure small masses. FINDINGS: There are no findings suspicious for malignancy. Images were processed with CAD. IMPRESSION: No mammographic evidence of malignancy. A result letter of this screening mammogram will be mailed directly to the patient. RECOMMENDATION: Screening mammogram in one year. (Code:SM-B-01Y) BI-RADS CATEGORY  1: Negative. Electronically Signed   By: Claudie Revering M.D.   On: 12/26/2018 10:18    Pelvic/Bimanual Pap is not indicated today per BCCCP guidelines.   Smoking History: Patient has never smoked.   Patient Navigation: Patient education provided. Access to services provided for patient through [place] program. Spanish interpreter Rudene Anda from [facility] provided. Patient given information about food market at the [facility] and offered bag of food.  Colorectal Cancer Screening: Per patient has never had colonoscopy completed. No complaints today.     Breast and Cervical Cancer  Risk Assessment: Patient does not have family history of breast cancer, known genetic mutations, or radiation treatment to the chest before age 5. Patient does not have history of cervical dysplasia, immunocompromised, or DES exposure in-utero.  Risk Assessment     Risk Scores       03/31/2021   Last edited by: Royston Bake, CMA   5-year risk: 0.5 %   Lifetime risk: 5.6 %            A: BCCCP exam without pap smear No complaints.  P: Referred patient to the [facility] for a screening mammogram on the mobile unit. Appointment scheduled Thursday, March 31, 2021 at 1030.  Loletta Parish, RN 03/31/2021 10:22 AM

## 2021-04-25 ENCOUNTER — Other Ambulatory Visit: Payer: Self-pay

## 2021-04-25 MED FILL — Atorvastatin Calcium Tab 20 MG (Base Equivalent): ORAL | 30 days supply | Qty: 30 | Fill #2 | Status: AC

## 2021-04-25 MED FILL — Lisinopril Tab 2.5 MG: ORAL | 30 days supply | Qty: 30 | Fill #2 | Status: AC

## 2021-05-10 ENCOUNTER — Ambulatory Visit: Payer: No Typology Code available for payment source

## 2021-05-30 ENCOUNTER — Other Ambulatory Visit: Payer: Self-pay

## 2021-05-30 ENCOUNTER — Other Ambulatory Visit: Payer: Self-pay | Admitting: Nurse Practitioner

## 2021-05-30 DIAGNOSIS — E119 Type 2 diabetes mellitus without complications: Secondary | ICD-10-CM

## 2021-05-30 MED ORDER — METFORMIN HCL 500 MG PO TABS
500.0000 mg | ORAL_TABLET | Freq: Every day | ORAL | 1 refills | Status: DC
  Filled 2021-05-30: qty 30, 30d supply, fill #0
  Filled 2021-06-06: qty 90, 90d supply, fill #0
  Filled 2021-07-18: qty 90, 90d supply, fill #1

## 2021-05-30 MED FILL — Atorvastatin Calcium Tab 20 MG (Base Equivalent): ORAL | 30 days supply | Qty: 30 | Fill #3 | Status: CN

## 2021-05-30 MED FILL — Metoprolol Succinate Tab ER 24HR 25 MG (Tartrate Equiv): ORAL | 30 days supply | Qty: 15 | Fill #0 | Status: CN

## 2021-05-30 MED FILL — Lisinopril Tab 2.5 MG: ORAL | 30 days supply | Qty: 30 | Fill #3 | Status: CN

## 2021-06-06 ENCOUNTER — Other Ambulatory Visit: Payer: Self-pay

## 2021-06-06 MED FILL — Metoprolol Succinate Tab ER 24HR 25 MG (Tartrate Equiv): ORAL | 30 days supply | Qty: 15 | Fill #0 | Status: AC

## 2021-06-06 MED FILL — Atorvastatin Calcium Tab 20 MG (Base Equivalent): ORAL | 30 days supply | Qty: 30 | Fill #3 | Status: AC

## 2021-06-06 MED FILL — Lisinopril Tab 2.5 MG: ORAL | 30 days supply | Qty: 30 | Fill #3 | Status: AC

## 2021-06-16 ENCOUNTER — Other Ambulatory Visit: Payer: Self-pay

## 2021-06-16 ENCOUNTER — Emergency Department (HOSPITAL_COMMUNITY): Payer: No Typology Code available for payment source

## 2021-06-16 ENCOUNTER — Emergency Department (HOSPITAL_COMMUNITY)
Admission: EM | Admit: 2021-06-16 | Discharge: 2021-06-16 | Disposition: A | Discharge status: Home / Self Care | Payer: No Typology Code available for payment source | Attending: Emergency Medicine | Admitting: Emergency Medicine

## 2021-06-16 DIAGNOSIS — M546 Pain in thoracic spine: Secondary | ICD-10-CM | POA: Diagnosis present

## 2021-06-16 DIAGNOSIS — M791 Myalgia, unspecified site: Secondary | ICD-10-CM | POA: Diagnosis not present

## 2021-06-16 DIAGNOSIS — Z7984 Long term (current) use of oral hypoglycemic drugs: Secondary | ICD-10-CM | POA: Diagnosis not present

## 2021-06-16 DIAGNOSIS — E119 Type 2 diabetes mellitus without complications: Secondary | ICD-10-CM | POA: Insufficient documentation

## 2021-06-16 DIAGNOSIS — R519 Headache, unspecified: Secondary | ICD-10-CM | POA: Insufficient documentation

## 2021-06-16 DIAGNOSIS — M7918 Myalgia, other site: Secondary | ICD-10-CM

## 2021-06-16 DIAGNOSIS — Y92410 Unspecified street and highway as the place of occurrence of the external cause: Secondary | ICD-10-CM | POA: Insufficient documentation
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
## 2021-06-24 ENCOUNTER — Ambulatory Visit: Payer: Self-pay | Attending: Nurse Practitioner | Admitting: Nurse Practitioner

## 2021-06-24 ENCOUNTER — Other Ambulatory Visit: Payer: Self-pay | Admitting: Nurse Practitioner

## 2021-06-24 ENCOUNTER — Other Ambulatory Visit: Payer: Self-pay

## 2021-06-24 ENCOUNTER — Encounter: Payer: Self-pay | Admitting: Nurse Practitioner

## 2021-06-24 VITALS — BP 113/74 | HR 68 | Ht 64.0 in | Wt 191.5 lb

## 2021-06-24 DIAGNOSIS — M25519 Pain in unspecified shoulder: Secondary | ICD-10-CM

## 2021-06-24 DIAGNOSIS — Z1211 Encounter for screening for malignant neoplasm of colon: Secondary | ICD-10-CM

## 2021-06-24 DIAGNOSIS — Z23 Encounter for immunization: Secondary | ICD-10-CM

## 2021-06-24 DIAGNOSIS — E119 Type 2 diabetes mellitus without complications: Secondary | ICD-10-CM

## 2021-06-24 DIAGNOSIS — M542 Cervicalgia: Secondary | ICD-10-CM

## 2021-06-24 DIAGNOSIS — E1165 Type 2 diabetes mellitus with hyperglycemia: Secondary | ICD-10-CM

## 2021-06-24 MED ORDER — TRUE METRIX BLOOD GLUCOSE TEST VI STRP
ORAL_STRIP | 6 refills | Status: DC
  Filled 2021-06-24 – 2021-07-18 (×2): qty 100, 90d supply, fill #0

## 2021-06-24 MED ORDER — TRUEPLUS LANCETS 28G MISC
6 refills | Status: DC
  Filled 2021-06-24 – 2021-07-18 (×2): qty 100, 90d supply, fill #0

## 2021-06-24 MED ORDER — NAPROXEN 500 MG PO TABS
ORAL_TABLET | Freq: Two times a day (BID) | ORAL | 2 refills | Status: AC
  Filled 2021-06-24: qty 60, 30d supply, fill #0

## 2021-06-24 MED ORDER — METHOCARBAMOL 500 MG PO TABS
ORAL_TABLET | Freq: Three times a day (TID) | ORAL | 1 refills | Status: AC | PRN
  Filled 2021-06-24: qty 90, 30d supply, fill #0
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
## 2021-06-24 NOTE — Progress Notes (Signed)
Marissa Jimenez [id number]

## 2021-06-25 LAB — CMP14+EGFR
ALT: 16 IU/L (ref 0–32)
AST: 14 IU/L (ref 0–40)
Albumin/Globulin Ratio: 1.6 (ref 1.2–2.2)
Albumin: 4.4 g/dL (ref 3.8–4.8)
Alkaline Phosphatase: 63 IU/L (ref 44–121)
BUN/Creatinine Ratio: 17 (ref 9–23)
BUN: 8 mg/dL (ref 6–24)
Bilirubin Total: 0.6 mg/dL (ref 0.0–1.2)
CO2: 25 mmol/L (ref 20–29)
Calcium: 9 mg/dL (ref 8.7–10.2)
Chloride: 99 mmol/L (ref 96–106)
Creatinine, Ser: 0.48 mg/dL — ABNORMAL LOW (ref 0.57–1.00)
Globulin, Total: 2.7 g/dL (ref 1.5–4.5)
Glucose: 125 mg/dL — ABNORMAL HIGH (ref 65–99)
Potassium: 4.5 mmol/L (ref 3.5–5.2)
Sodium: 136 mmol/L (ref 134–144)
Total Protein: 7.1 g/dL (ref 6.0–8.5)
eGFR: 119 mL/min/{1.73_m2} (ref >59)

## 2021-06-25 LAB — HEMOGLOBIN A1C
Est. average glucose Bld gHb Est-mCnc: 143 mg/dL
Hgb A1c MFr Bld: 6.6 % — ABNORMAL HIGH (ref 4.8–5.6)

## 2021-07-01 ENCOUNTER — Other Ambulatory Visit: Payer: Self-pay

## 2021-07-18 ENCOUNTER — Telehealth: Payer: Self-pay | Admitting: Nurse Practitioner

## 2021-07-18 ENCOUNTER — Other Ambulatory Visit: Payer: Self-pay

## 2021-07-18 MED FILL — Lisinopril Tab 2.5 MG: ORAL | 60 days supply | Qty: 60 | Fill #4 | Status: AC

## 2021-07-18 MED FILL — Atorvastatin Calcium Tab 20 MG (Base Equivalent): ORAL | 60 days supply | Qty: 60 | Fill #4 | Status: AC

## 2021-07-18 MED FILL — Metoprolol Succinate Tab ER 24HR 25 MG (Tartrate Equiv): ORAL | 90 days supply | Qty: 45 | Fill #1 | Status: AC

## 2021-07-18 NOTE — Telephone Encounter (Signed)
Pt was in office so I informed her of test results with the help of a translator.

## 2021-07-18 NOTE — Telephone Encounter (Signed)
Pt wanting to know recent results from 06/24/21 Pt is in waiting room getting medications. Thank you.

## 2021-07-26 ENCOUNTER — Ambulatory Visit: Payer: Self-pay | Attending: Nurse Practitioner | Admitting: Nurse Practitioner

## 2021-07-26 ENCOUNTER — Encounter: Payer: Self-pay | Admitting: Nurse Practitioner

## 2021-07-26 ENCOUNTER — Other Ambulatory Visit: Payer: Self-pay

## 2021-07-26 DIAGNOSIS — Z09 Encounter for follow-up examination after completed treatment for conditions other than malignant neoplasm: Secondary | ICD-10-CM
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
## 2021-09-12 ENCOUNTER — Other Ambulatory Visit: Payer: Self-pay

## 2021-09-12 ENCOUNTER — Other Ambulatory Visit: Payer: Self-pay | Admitting: Nurse Practitioner

## 2021-09-12 DIAGNOSIS — E119 Type 2 diabetes mellitus without complications: Secondary | ICD-10-CM

## 2021-09-13 NOTE — Telephone Encounter (Signed)
Requested medication (s) are due for refill today: no  Requested medication (s) are on the active medication list: yes  Last refill:  07/18/21  Future visit scheduled: 09/26/21  Notes to clinic:  too soon for refill, please assess.   Requested Prescriptions  Pending Prescriptions Disp Refills   metFORMIN (GLUCOPHAGE) 500 MG tablet 90 tablet 1    Sig: Take 1 tablet (500 mg total) by mouth daily with breakfast.     Endocrinology:  Diabetes - Biguanides Failed - 09/12/2021  4:32 PM      Failed - Cr in normal range and within 360 days    Creat  Date Value Ref Range Status  04/14/2016 0.60 0.50 - 1.10 mg/dL Final   Creatinine, Ser  Date Value Ref Range Status  06/24/2021 0.48 (L) 0.57 - 1.00 mg/dL Final          Passed - HBA1C is between 0 and 7.9 and within 180 days    HbA1c, POC (controlled diabetic range)  Date Value Ref Range Status  03/18/2021 6.1 0.0 - 7.0 % Final   Hgb A1c MFr Bld  Date Value Ref Range Status  06/24/2021 6.6 (H) 4.8 - 5.6 % Final    Comment:             Prediabetes: 5.7 - 6.4          Diabetes: >6.4          Glycemic control for adults with diabetes: <7.0           Passed - eGFR in normal range and within 360 days    GFR, Est African American  Date Value Ref Range Status  04/14/2016 >89 >=60 mL/min Final   GFR calc Af Amer  Date Value Ref Range Status  10/18/2020 128 >59 mL/min/1.73 Final    Comment:    **In accordance with recommendations from the NKF-ASN Task force,**   [facility] is in the process of updating its eGFR calculation to the   2021 CKD-EPI creatinine equation that estimates kidney function   without a race variable.    GFR, Est Non African American  Date Value Ref Range Status  04/14/2016 >89 >=60 mL/min Final   GFR calc non Af Amer  Date Value Ref Range Status  10/18/2020 111 >59 mL/min/1.73 Final   eGFR  Date Value Ref Range Status  06/24/2021 119 >59 mL/min/1.73 Final          Passed - Valid encounter within  last 6 months    Recent Outpatient Visits           1 month ago Hospital discharge follow-up   [place], [place] W, NP   2 months ago Type 2 diabetes mellitus with hyperglycemia, without long-term current use of insulin [facility])   [place], [place] W, NP   5 months ago Controlled type 2 diabetes mellitus without complication, without long-term current use of insulin [facility])   [place], [place] W, NP   10 months ago Chronic bilateral low back pain without sciatica   [place], [place] W, NP   11 months ago Controlled type 2 diabetes mellitus without complication, without long-term current use of insulin [facility])   [place] Gildardo Pounds, NP       Future Appointments             In 1  week Gildardo Pounds, NP [place]

## 2021-09-14 ENCOUNTER — Other Ambulatory Visit: Payer: Self-pay

## 2021-09-14 MED ORDER — METFORMIN HCL 500 MG PO TABS
500.0000 mg | ORAL_TABLET | Freq: Every day | ORAL | 2 refills | Status: DC
  Filled 2021-09-14 – 2021-12-23 (×3): qty 30, 30d supply, fill #0
  Filled 2022-02-07: qty 30, 30d supply, fill #1
  Filled 2022-04-05: qty 30, 30d supply, fill #2

## 2021-09-26 ENCOUNTER — Ambulatory Visit: Payer: No Typology Code available for payment source | Admitting: Nurse Practitioner

## 2021-09-26 ENCOUNTER — Other Ambulatory Visit: Payer: Self-pay

## 2021-09-26 ENCOUNTER — Other Ambulatory Visit: Payer: Self-pay | Admitting: Nurse Practitioner

## 2021-09-26 DIAGNOSIS — E119 Type 2 diabetes mellitus without complications: Secondary | ICD-10-CM

## 2021-09-26 DIAGNOSIS — E785 Hyperlipidemia, unspecified: Secondary | ICD-10-CM

## 2021-09-27 ENCOUNTER — Other Ambulatory Visit: Payer: Self-pay

## 2021-09-27 MED ORDER — LISINOPRIL 2.5 MG PO TABS
ORAL_TABLET | Freq: Every day | ORAL | 0 refills | Status: DC
  Filled 2021-09-27 – 2021-10-05 (×2): qty 90, 90d supply, fill #0

## 2021-09-27 MED ORDER — ATORVASTATIN CALCIUM 20 MG PO TABS
ORAL_TABLET | Freq: Every day | ORAL | 0 refills | Status: DC
  Filled 2021-09-27 – 2021-10-05 (×2): qty 90, 90d supply, fill #0

## 2021-09-27 NOTE — Telephone Encounter (Signed)
Requested Prescriptions  Pending Prescriptions Disp Refills   atorvastatin (LIPITOR) 20 MG tablet 90 tablet 0    Sig: TAKE 1 TABLET (20 MG TOTAL) BY MOUTH DAILY.     Cardiovascular:  Antilipid - Statins Passed - 09/26/2021 12:39 PM      Passed - Total Cholesterol in normal range and within 360 days    Cholesterol, Total  Date Value Ref Range Status  10/18/2020 159 100 - 199 mg/dL Final         Passed - LDL in normal range and within 360 days    LDL Chol Calc (NIH)  Date Value Ref Range Status  10/18/2020 70 0 - 99 mg/dL Final         Passed - HDL in normal range and within 360 days    HDL  Date Value Ref Range Status  10/18/2020 72 >39 mg/dL Final         Passed - Triglycerides in normal range and within 360 days    Triglycerides  Date Value Ref Range Status  10/18/2020 95 0 - 149 mg/dL Final         Passed - Patient is not pregnant      Passed - Valid encounter within last 12 months    Recent Outpatient Visits          2 months ago Hospital discharge follow-up   [place], [place] W, NP   3 months ago Type 2 diabetes mellitus with hyperglycemia, without long-term current use of insulin [facility])   [place], [place] W, NP   6 months ago Controlled type 2 diabetes mellitus without complication, without long-term current use of insulin [facility])   [place], [place] W, NP   10 months ago Chronic bilateral low back pain without sciatica   [place], [place] W, NP   11 months ago Controlled type 2 diabetes mellitus without complication, without long-term current use of insulin [facility])   [place], Vernia Buff, NP      Future Appointments            In 2 months Gildardo Pounds, NP [place]            lisinopril (ZESTRIL) 2.5 MG tablet 90 tablet 0    Sig:  TAKE 1 TABLET (2.5 MG TOTAL) BY MOUTH DAILY.     Cardiovascular:  ACE Inhibitors Failed - 09/26/2021 12:39 PM      Failed - Cr in normal range and within 180 days    Creat  Date Value Ref Range Status  04/14/2016 0.60 0.50 - 1.10 mg/dL Final   Creatinine, Ser  Date Value Ref Range Status  06/24/2021 0.48 (L) 0.57 - 1.00 mg/dL Final         Passed - K in normal range and within 180 days    Potassium  Date Value Ref Range Status  06/24/2021 4.5 3.5 - 5.2 mmol/L Final         Passed - Patient is not pregnant      Passed - Last BP in normal range    BP Readings from Last 1 Encounters:  06/24/21 113/74         Passed - Valid encounter within last 6 months    Recent Outpatient Visits          2 months  ago Hospital discharge follow-up   [place], [place] W, NP   3 months ago Type 2 diabetes mellitus with hyperglycemia, without long-term current use of insulin [facility])   [place], [place] W, NP   6 months ago Controlled type 2 diabetes mellitus without complication, without long-term current use of insulin [facility])   [place], [place] W, NP   10 months ago Chronic bilateral low back pain without sciatica   [place], [place] W, NP   11 months ago Controlled type 2 diabetes mellitus without complication, without long-term current use of insulin [facility])   [place], [place] W, NP      Future Appointments            In 2 months Gildardo Pounds, NP [place]

## 2021-10-05 ENCOUNTER — Other Ambulatory Visit: Payer: Self-pay

## 2021-10-06 ENCOUNTER — Other Ambulatory Visit: Payer: Self-pay

## 2021-11-28 ENCOUNTER — Ambulatory Visit: Payer: No Typology Code available for payment source | Admitting: Nurse Practitioner

## 2021-12-23 ENCOUNTER — Other Ambulatory Visit: Payer: Self-pay | Admitting: Nurse Practitioner

## 2021-12-23 ENCOUNTER — Other Ambulatory Visit: Payer: Self-pay

## 2021-12-23 DIAGNOSIS — R002 Palpitations: Secondary | ICD-10-CM

## 2021-12-23 NOTE — Telephone Encounter (Signed)
Requested medication (s) are due for refill today - expired Rx ? ?Requested medication (s) are on the active medication list -yes ? ?Future visit scheduled -yes ? ?Last refill: 10/15/20 #45 2RF ? ?Notes to clinic: Request RF: expired Rx ? ?Requested Prescriptions  ?Pending Prescriptions Disp Refills  ? metoprolol succinate (TOPROL-XL) 25 MG 24 hr tablet 45 tablet 2  ?  Sig: TAKE 0.5 TABLETS (12.5 MG TOTAL) BY MOUTH DAILY.  ?  ? Cardiovascular:  Beta Blockers Passed - 12/23/2021  2:23 PM  ?  ?  Passed - Last BP in normal range  ?  BP Readings from Last 1 Encounters:  ?06/24/21 113/74  ?  ?  ?  ?  Passed - Last Heart Rate in normal range  ?  Pulse Readings from Last 1 Encounters:  ?06/24/21 68  ?  ?  ?  ?  Passed - Valid encounter within last 6 months  ?  Recent Outpatient Visits   ? ?      ? 5 months ago Hospital discharge follow-up  ? [place], [place] W, NP  ? 6 months ago Type 2 diabetes mellitus with hyperglycemia, without long-term current use of insulin ([place])  ? [place], [place] W, NP  ? 9 months ago Controlled type 2 diabetes mellitus without complication, without long-term current use of insulin ([place])  ? [place], [place] W, NP  ? 1 year ago Chronic bilateral low back pain without sciatica  ? [place], [place] W, NP  ? 1 year ago Controlled type 2 diabetes mellitus without complication, without long-term current use of insulin ([place])  ? [place] Gildardo Pounds, NP  ? ?  ?  ?Future Appointments   ? ?        ? In 1 month Gildardo Pounds, NP [place]  ? ?  ? ?  ?  ?  ? ? ? ?Requested Prescriptions  ?Pending Prescriptions Disp Refills  ? metoprolol succinate (TOPROL-XL) 25 MG 24 hr tablet 45 tablet 2  ?  Sig: TAKE 0.5 TABLETS (12.5 MG TOTAL) BY MOUTH DAILY.  ?  ? Cardiovascular:  Beta Blockers  Passed - 12/23/2021  2:23 PM  ?  ?  Passed - Last BP in normal range  ?  BP Readings from Last 1 Encounters:  ?06/24/21 113/74  ?  ?  ?  ?  Passed - Last Heart Rate in normal range  ?  Pulse Readings from Last 1 Encounters:  ?06/24/21 68  ?  ?  ?  ?  Passed - Valid encounter within last 6 months  ?  Recent Outpatient Visits   ? ?      ? 5 months ago Hospital discharge follow-up  ? [place], [place] W, NP  ? 6 months ago Type 2 diabetes mellitus with hyperglycemia, without long-term current use of insulin ([place])  ? [place], [place] W, NP  ? 9 months ago Controlled type 2 diabetes mellitus without complication, without long-term current use of insulin ([place])  ? [place], [place] W, NP  ? 1 year ago Chronic bilateral low back pain without sciatica  ? [place], Vernia Buff, NP  ? 1 year ago Controlled type  2 diabetes mellitus without complication, without long-term current use of insulin ([place])  ? [place] Gildardo Pounds, NP  ? ?  ?  ?Future Appointments   ? ?        ? In 1 month Gildardo Pounds, NP [place]  ? ?  ? ?  ?  ?  ? ? ? ?

## 2021-12-26 ENCOUNTER — Other Ambulatory Visit: Payer: Self-pay

## 2021-12-26 MED ORDER — METOPROLOL SUCCINATE ER 25 MG PO TB24
ORAL_TABLET | Freq: Every day | ORAL | 0 refills | Status: DC
  Filled 2021-12-26: qty 15, 30d supply, fill #0

## 2022-02-03 ENCOUNTER — Ambulatory Visit: Payer: No Typology Code available for payment source | Admitting: Nurse Practitioner

## 2022-02-07 ENCOUNTER — Other Ambulatory Visit: Payer: Self-pay | Admitting: Nurse Practitioner

## 2022-02-07 ENCOUNTER — Other Ambulatory Visit: Payer: Self-pay

## 2022-02-07 DIAGNOSIS — E785 Hyperlipidemia, unspecified: Secondary | ICD-10-CM

## 2022-02-07 DIAGNOSIS — E119 Type 2 diabetes mellitus without complications: Secondary | ICD-10-CM

## 2022-02-08 ENCOUNTER — Other Ambulatory Visit: Payer: Self-pay

## 2022-02-08 MED ORDER — LISINOPRIL 2.5 MG PO TABS
2.5000 mg | ORAL_TABLET | Freq: Every day | ORAL | 0 refills | Status: DC
  Filled 2022-02-08: qty 30, 30d supply, fill #0

## 2022-02-08 MED ORDER — ATORVASTATIN CALCIUM 20 MG PO TABS
20.0000 mg | ORAL_TABLET | Freq: Every day | ORAL | 0 refills | Status: DC
  Filled 2022-02-08: qty 30, 30d supply, fill #0

## 2022-02-13 ENCOUNTER — Other Ambulatory Visit: Payer: Self-pay

## 2022-02-14 ENCOUNTER — Other Ambulatory Visit: Payer: Self-pay

## 2022-04-05 ENCOUNTER — Other Ambulatory Visit: Payer: Self-pay

## 2022-04-12 ENCOUNTER — Ambulatory Visit: Payer: No Typology Code available for payment source | Admitting: Physician Assistant

## 2022-05-10 ENCOUNTER — Ambulatory Visit: Payer: No Typology Code available for payment source | Admitting: Physician Assistant

## 2022-06-15 ENCOUNTER — Other Ambulatory Visit: Payer: Self-pay

## 2022-06-15 ENCOUNTER — Other Ambulatory Visit: Payer: Self-pay | Admitting: Nurse Practitioner

## 2022-06-15 ENCOUNTER — Other Ambulatory Visit: Payer: Self-pay | Admitting: Family Medicine

## 2022-06-15 DIAGNOSIS — E119 Type 2 diabetes mellitus without complications: Secondary | ICD-10-CM

## 2022-06-15 MED ORDER — OMEPRAZOLE 20 MG PO CPDR
DELAYED_RELEASE_CAPSULE | ORAL | 0 refills | Status: DC
Start: 2022-06-15 — End: 2022-07-10
  Filled 2022-06-15: qty 60, 30d supply, fill #0

## 2022-06-15 MED ORDER — METFORMIN HCL 500 MG PO TABS
500.0000 mg | ORAL_TABLET | Freq: Every day | ORAL | 0 refills | Status: DC
  Filled 2022-06-15: qty 30, 30d supply, fill #0

## 2022-06-16 ENCOUNTER — Other Ambulatory Visit: Payer: Self-pay

## 2022-06-16 ENCOUNTER — Other Ambulatory Visit (HOSPITAL_COMMUNITY): Payer: Self-pay

## 2022-06-23 ENCOUNTER — Other Ambulatory Visit: Payer: Self-pay | Admitting: Nurse Practitioner

## 2022-06-23 ENCOUNTER — Other Ambulatory Visit: Payer: Self-pay

## 2022-06-23 ENCOUNTER — Ambulatory Visit: Payer: Self-pay

## 2022-06-23 DIAGNOSIS — R300 Dysuria: Secondary | ICD-10-CM

## 2022-06-23 DIAGNOSIS — N3000 Acute cystitis without hematuria: Secondary | ICD-10-CM

## 2022-06-23 LAB — POCT URINALYSIS DIP (CLINITEK)
Glucose, UA: 250 mg/dL — AB
Nitrite, UA: POSITIVE — AB
POC PROTEIN,UA: >=300 — AB
Spec Grav, UA: <=1.005 — AB (ref 1.010–1.025)
Urobilinogen, UA: >=8 E.U./dL — AB
pH, UA: 5 (ref 5.0–8.0)

## 2022-06-23 MED ORDER — NITROFURANTOIN MONOHYD MACRO 100 MG PO CAPS
100.0000 mg | ORAL_CAPSULE | Freq: Two times a day (BID) | ORAL | 0 refills | Status: AC
  Filled 2022-06-23: qty 6, 3d supply, fill #0

## 2022-06-23 NOTE — Telephone Encounter (Signed)
Routing to CMA 

## 2022-06-23 NOTE — Telephone Encounter (Signed)
Call returned, appt scheduled.

## 2022-06-23 NOTE — Telephone Encounter (Signed)
   Used Spanish interpreter # [id number]. Chief Complaint: Pain with voiding. Asking to be worked in. Symptoms: Abdominal pain Frequency: 2 days ago Pertinent Negatives: Patient denies fever Disposition: '[]'$ ED /'[x]'$ Urgent Care (no appt availability in office) / '[]'$ Appointment(In office/virtual)/ '[]'$  [place] Virtual Care/ '[]'$ Home Care/ '[]'$ Refused Recommended Disposition /'[]'$ [place] Mobile Bus/ '[x]'$  Follow-up with PCP Additional Notes: Instructed to go to UC if unable to be seen today.  Answer Assessment - Initial Assessment Questions 1. SYMPTOM: "What's the main symptom you're concerned about?" (e.g., frequency, incontinence)     Pain and stomach and abdomen 2. ONSET: "When did the    start?"     2 days ago 3. PAIN: "Is there any pain?" If Yes, ask: "How bad is it?" (Scale: 1-10; mild, moderate, severe)     7-8 4. CAUSE: "What do you think is causing the symptoms?"     UTI 5. OTHER SYMPTOMS: "Do you have any other symptoms?" (e.g., blood in urine, fever, flank pain, pain with urination)     NO 6. PREGNANCY: "Is there any chance you are pregnant?" "When was your last menstrual period?"     No  Protocols used: Urinary Symptoms-A-AH

## 2022-06-23 NOTE — Telephone Encounter (Signed)
Used Spanish interpreter # [id number].

## 2022-06-26 ENCOUNTER — Other Ambulatory Visit: Payer: Self-pay

## 2022-06-26 ENCOUNTER — Ambulatory Visit: Payer: No Typology Code available for payment source | Attending: Nurse Practitioner | Admitting: Nurse Practitioner

## 2022-06-26 ENCOUNTER — Encounter: Payer: Self-pay | Admitting: Nurse Practitioner

## 2022-06-26 DIAGNOSIS — N390 Urinary tract infection, site not specified: Secondary | ICD-10-CM

## 2022-06-26 NOTE — Progress Notes (Signed)
Virtual Visit via Telephone Note  I discussed the limitations, risks, security and privacy concerns of performing an evaluation and management service by telephone and the availability of in person appointments. I also discussed with the patient that there may be a patient responsible charge related to this service. The patient expressed understanding and agreed to proceed.    I connected with Marissa Jimenez on 06/26/22  at   1:30 PM EDT  EDT by telephone and verified that I am speaking with the correct person using two identifiers.  Location of Patient: Private Residence   Location of Provider: [place] and [place] participating in Telemedicine visit: Geryl Rankins FNP-BC [place]    History of Present Illness: Telemedicine visit for: UTI Symptoms  [name] has complaints of UTI over the past several days. Symptoms include: dysuria with tingling in urethra after urination, bilateral flank pain, back pain and urinary frequency. She denies hematuria, nausea, vomiting or fever.    Past Medical History:  Diagnosis Date   Arthritis 2012   Diabetes mellitus without complication ([place])    Headache 2013    Subacromial bursitis 08/04/2013    Past Surgical History:  Procedure Laterality Date   CESAREAN SECTION     two c-sections 2002, 2008   TUBAL LIGATION      Family History  Problem Relation Age of Onset   Hyperlipidemia Mother    Arthritis Mother    Diabetes Father    Diabetes Sister    Alcohol abuse Brother    Cancer Neg Hx     Social History   Socioeconomic History   Marital status: Married    Spouse name: Not on file   Number of children: 3   Years of education: 6   Highest education level: Not on file  Occupational History   Occupation: Homemaker   Tobacco Use   Smoking status: Never   Smokeless tobacco: Never  Vaping Use   Vaping Use: Never used  Substance and Sexual Activity   Alcohol use: No   Drug use:  No   Sexual activity: Yes    Birth control/protection: Surgical  Other Topics Concern   Not on file  Social History Narrative   Lives in [place] with husband and three children. Stay at home mother.   Children age- 57, 17, 72.    From [place].   Lived in [place] for 17 years.    Social Determinants of Health   Financial Resource Strain: Not on file  Food Insecurity: Food Insecurity Present (03/31/2021)   Hunger Vital Sign    Worried About Running Out of Food in the Last Year: Often true    Ran Out of Food in the Last Year: Often true  Transportation Needs: No Transportation Needs (03/31/2021)   PRAPARE - Hydrologist (Medical): No    Lack of Transportation (Non-Medical): No  Physical Activity: Insufficiently Active (11/01/2017)   Exercise Vital Sign    Days of Exercise per Week: 2 days    Minutes of Exercise per Session: 60 min  Stress: No Stress Concern Present (11/01/2017)   [place]    Feeling of Stress : Not at all  Social Connections: Moderately Integrated (11/01/2017)   Social Connection and Isolation Panel [NHANES]    Frequency of Communication with Friends and Family: Twice a week    Frequency of Social Gatherings with Friends and Family: Once a  week    Attends Religious Services: More than 4 times per year    Active Member of Clubs or Organizations: No    Attends Archivist Meetings: Never    Marital Status: Living with partner     Observations/Objective: Awake, alert and oriented x 3   Review of Systems  Constitutional:  Negative for fever, malaise/fatigue and weight loss.  HENT: Negative.  Negative for nosebleeds.   Eyes: Negative.  Negative for blurred vision, double vision and photophobia.  Respiratory: Negative.  Negative for cough and shortness of breath.   Cardiovascular: Negative.  Negative for chest pain, palpitations and leg swelling.   Gastrointestinal: Negative.  Negative for heartburn, nausea and vomiting.  Genitourinary:  Positive for dysuria, flank pain and frequency. Negative for hematuria and urgency.       SEE HPI  Musculoskeletal:  Negative for myalgias.  Neurological: Negative.  Negative for dizziness, focal weakness, seizures and headaches.  Psychiatric/Behavioral: Negative.  Negative for suicidal ideas.     Assessment and Plan: Diagnoses and all orders for this visit:  UTI (urinary tract infection), uncomplicated Macrobid sent to pharmacy    Follow Up Instructions Return if symptoms worsen or fail to improve.     I discussed the assessment and treatment plan with the patient. The patient was provided an opportunity to ask questions and all were answered. The patient agreed with the plan and demonstrated an understanding of the instructions.   The patient was advised to call back or seek an in-person evaluation if the symptoms worsen or if the condition fails to improve as anticipated.  I provided 11 minutes of non-face-to-face time during this encounter including median intraservice time, reviewing previous notes, labs, imaging, medications and explaining diagnosis and management.  Gildardo Pounds, FNP-BC

## 2022-06-28 ENCOUNTER — Ambulatory Visit: Payer: Self-pay | Admitting: *Deleted

## 2022-06-28 NOTE — Telephone Encounter (Signed)
Called with Spanish interpreter Alphonso- ID # [phone]  Unable to come in for an apt. Tomorrow. States she was told to get an OTC medication which has helped since yesterday. Feeling better, will call if need appt.

## 2022-06-28 NOTE — Telephone Encounter (Signed)
Reason for Disposition  [1] Taking antibiotic > 24 hours for UTI AND [2] flank or lower back pain getting WORSE  Answer Assessment - Initial Assessment Questions 1. SYMPTOM: "What's the main symptom you're concerned about?" (e.g., frequency, incontinence)     Pt calling in with Spanish interpreter.   Marissa Jimenez [id number] Pt been on antibiotics for UTI and still having symptoms.    Last pill is today. 2. ONSET: "When did the  pain  start?"     Thur. Of last week pain originally started.   The pain went away day before yesterday and then yesterday it came back. 3. PAIN: "Is there any pain?" If Yes, ask: "How bad is it?" (Scale: 1-10; mild, moderate, severe)     I only have pain in stomach when finish urinating.   I'm having a lot of pain in my back.   When it started I had the back pain but now it's worse. 4. CAUSE: "What do you think is causing the symptoms?"     UTI not gone away. 5. OTHER SYMPTOMS: "Do you have any other symptoms?" (e.g., blood in urine, fever, flank pain, pain with urination)      I left a urine sample on 9/15.   Geryl Rankins, NP called me on 18th and told me about the UTI.   I started on the antibiotics. 6. PREGNANCY: "Is there any chance you are pregnant?" "When was your last menstrual period?"     Not asked  Protocols used: Urinary Symptoms-A-AH, Urinary Tract Infection on Antibiotic Follow-up Call [facility]  Chief Complaint: Diagnosed with UTI.  Today is last day on antibiotic but still having the same symptoms except the flank pain is worse. Symptoms: Having abd pain after urinating and the "back pain is getting worse"  Symptoms went away day before yesterday but then returned starting yesterday. Frequency: Since Geryl Rankins called and told her she had a UTI and started the antibiotics.   Pertinent Negatives: Patient denies blood in urine or burning with urination. Disposition: '[]'$ ED /'[]'$ Urgent Care (no appt availability in office) / '[]'$ Appointment(In office/virtual)/ '[]'$   [place] Virtual Care/ '[]'$ Home Care/ '[]'$ Refused Recommended Disposition /'[]'$ [place] Mobile Bus/ '[x]'$  Follow-up with PCP Additional Notes: Pt agreeable to someone calling her back regarding further directions.    No appts available today at [place].

## 2022-07-10 ENCOUNTER — Other Ambulatory Visit: Payer: Self-pay

## 2022-07-10 ENCOUNTER — Ambulatory Visit: Payer: Self-pay | Attending: Nurse Practitioner | Admitting: Nurse Practitioner

## 2022-07-10 ENCOUNTER — Encounter: Payer: Self-pay | Admitting: Nurse Practitioner

## 2022-07-10 VITALS — BP 127/80 | HR 61 | Temp 97.8°F | Ht 64.0 in | Wt 202.2 lb

## 2022-07-10 DIAGNOSIS — E785 Hyperlipidemia, unspecified: Secondary | ICD-10-CM

## 2022-07-10 DIAGNOSIS — R002 Palpitations: Secondary | ICD-10-CM

## 2022-07-10 DIAGNOSIS — M25561 Pain in right knee: Secondary | ICD-10-CM

## 2022-07-10 DIAGNOSIS — Z23 Encounter for immunization: Secondary | ICD-10-CM

## 2022-07-10 DIAGNOSIS — Z130 Encounter for screening for diseases of the blood and blood-forming organs and certain disorders involving the immune mechanism: Secondary | ICD-10-CM

## 2022-07-10 DIAGNOSIS — K2100 Gastro-esophageal reflux disease with esophagitis, without bleeding: Secondary | ICD-10-CM

## 2022-07-10 DIAGNOSIS — M546 Pain in thoracic spine: Secondary | ICD-10-CM

## 2022-07-10 DIAGNOSIS — E1165 Type 2 diabetes mellitus with hyperglycemia: Secondary | ICD-10-CM

## 2022-07-10 DIAGNOSIS — G8929 Other chronic pain: Secondary | ICD-10-CM

## 2022-07-10 LAB — GLUCOSE, POCT (MANUAL RESULT ENTRY): POC Glucose: 126 mg/dl — AB (ref 70–99)

## 2022-07-10 LAB — POCT GLYCOSYLATED HEMOGLOBIN (HGB A1C): HbA1c, POC (controlled diabetic range): 6.3 % (ref 0.0–7.0)

## 2022-07-10 MED ORDER — LISINOPRIL 2.5 MG PO TABS
2.5000 mg | ORAL_TABLET | Freq: Every day | ORAL | 1 refills | Status: DC
  Filled 2022-07-10: qty 90, 90d supply, fill #0

## 2022-07-10 MED ORDER — METHOCARBAMOL 500 MG PO TABS
500.0000 mg | ORAL_TABLET | Freq: Three times a day (TID) | ORAL | 1 refills | Status: DC | PRN
  Filled 2022-07-10: qty 90, 30d supply, fill #0

## 2022-07-10 MED ORDER — ATORVASTATIN CALCIUM 20 MG PO TABS
20.0000 mg | ORAL_TABLET | Freq: Every day | ORAL | 1 refills | Status: DC
  Filled 2022-07-10: qty 90, 90d supply, fill #0

## 2022-07-10 MED ORDER — IBUPROFEN 800 MG PO TABS
800.0000 mg | ORAL_TABLET | Freq: Three times a day (TID) | ORAL | 2 refills | Status: DC | PRN
  Filled 2022-07-10: qty 60, 20d supply, fill #0

## 2022-07-10 MED ORDER — OMEPRAZOLE 20 MG PO CPDR
20.0000 mg | DELAYED_RELEASE_CAPSULE | Freq: Two times a day (BID) | ORAL | 1 refills | Status: DC
  Filled 2022-07-10: qty 180, 90d supply, fill #0

## 2022-07-10 MED ORDER — METFORMIN HCL 500 MG PO TABS
500.0000 mg | ORAL_TABLET | Freq: Every day | ORAL | 1 refills | Status: DC
  Filled 2022-07-10: qty 90, 90d supply, fill #0
  Filled 2022-12-11 (×2): qty 90, 90d supply, fill #1

## 2022-07-10 MED ORDER — METOPROLOL SUCCINATE ER 25 MG PO TB24
12.5000 mg | ORAL_TABLET | Freq: Every day | ORAL | 1 refills | Status: DC
  Filled 2022-07-10: qty 45, 90d supply, fill #0
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
## 2022-07-13 LAB — CBC WITH DIFFERENTIAL/PLATELET
Basophils Absolute: 0.1 10*3/uL (ref 0.0–0.2)
Basos: 1 %
EOS (ABSOLUTE): 0.1 10*3/uL (ref 0.0–0.4)
Eos: 1 %
Hematocrit: 41.8 % (ref 34.0–46.6)
Hemoglobin: 13.3 g/dL (ref 11.1–15.9)
Immature Grans (Abs): 0.1 10*3/uL (ref 0.0–0.1)
Immature Granulocytes: 1 %
Lymphocytes Absolute: 2.2 10*3/uL (ref 0.7–3.1)
Lymphs: 28 %
MCH: 29.6 pg (ref 26.6–33.0)
MCHC: 31.8 g/dL (ref 31.5–35.7)
MCV: 93 fL (ref 79–97)
Monocytes Absolute: 0.5 10*3/uL (ref 0.1–0.9)
Monocytes: 7 %
Neutrophils Absolute: 4.9 10*3/uL (ref 1.4–7.0)
Neutrophils: 62 %
Platelets: 366 10*3/uL (ref 150–450)
RBC: 4.5 x10E6/uL (ref 3.77–5.28)
RDW: 13.4 % (ref 11.7–15.4)
WBC: 7.9 10*3/uL (ref 3.4–10.8)

## 2022-07-13 LAB — CMP14+EGFR
ALT: 13 IU/L (ref 0–32)
AST: 12 IU/L (ref 0–40)
Albumin/Globulin Ratio: 1.5 (ref 1.2–2.2)
Albumin: 4.7 g/dL (ref 3.9–4.9)
Alkaline Phosphatase: 69 IU/L (ref 44–121)
BUN/Creatinine Ratio: 20 (ref 9–23)
BUN: 11 mg/dL (ref 6–24)
Bilirubin Total: 0.4 mg/dL (ref 0.0–1.2)
CO2: 23 mmol/L (ref 20–29)
Calcium: 9.4 mg/dL (ref 8.7–10.2)
Chloride: 102 mmol/L (ref 96–106)
Creatinine, Ser: 0.54 mg/dL — ABNORMAL LOW (ref 0.57–1.00)
Globulin, Total: 3.1 g/dL (ref 1.5–4.5)
Glucose: 118 mg/dL — ABNORMAL HIGH (ref 70–99)
Potassium: 4.8 mmol/L (ref 3.5–5.2)
Sodium: 140 mmol/L (ref 134–144)
Total Protein: 7.8 g/dL (ref 6.0–8.5)
eGFR: 115 mL/min/{1.73_m2} (ref >59)

## 2022-07-13 LAB — LIPID PANEL
Chol/HDL Ratio: 2.5 ratio (ref 0.0–4.4)
Cholesterol, Total: 159 mg/dL (ref 100–199)
HDL: 63 mg/dL (ref >39)
LDL Chol Calc (NIH): 78 mg/dL (ref 0–99)
Triglycerides: 96 mg/dL (ref 0–149)
VLDL Cholesterol Cal: 18 mg/dL (ref 5–40)

## 2022-07-13 LAB — MICROALBUMIN / CREATININE URINE RATIO
Creatinine, Urine: 85.3 mg/dL
Microalb/Creat Ratio: 4 mg/g creat (ref 0–29)
Microalbumin, Urine: 3.8 ug/mL

## 2022-09-07 ENCOUNTER — Other Ambulatory Visit: Payer: Self-pay

## 2022-09-07 DIAGNOSIS — Z1231 Encounter for screening mammogram for malignant neoplasm of breast: Secondary | ICD-10-CM

## 2022-10-16 ENCOUNTER — Encounter: Payer: Self-pay | Admitting: Nurse Practitioner

## 2022-10-16 ENCOUNTER — Ambulatory Visit: Payer: Self-pay | Attending: Nurse Practitioner | Admitting: Nurse Practitioner

## 2022-10-16 VITALS — BP 115/71 | HR 69 | Ht 64.0 in | Wt 204.4 lb

## 2022-10-16 DIAGNOSIS — Z1211 Encounter for screening for malignant neoplasm of colon: Secondary | ICD-10-CM

## 2022-10-16 DIAGNOSIS — R6882 Decreased libido: Secondary | ICD-10-CM

## 2022-10-16 DIAGNOSIS — Z0000 Encounter for general adult medical examination without abnormal findings: Secondary | ICD-10-CM

## 2022-10-16 NOTE — Progress Notes (Signed)
[place] [id number] Breast pain

## 2022-10-16 NOTE — Progress Notes (Signed)
Assessment & Plan:  Marissa Jimenez was seen today for annual exam.  Diagnoses and all orders for this visit:  Encounter for annual physical exam  Colon cancer screening -     Fecal occult blood, imunochemical  Libido, decreased -     Ambulatory referral to Gynecology    Patient has been counseled on age-appropriate routine health concerns for screening and prevention. These are reviewed and up-to-date. Referrals have been placed accordingly. Immunizations are up-to-date or declined.    Subjective:   Chief Complaint  Patient presents with   Annual Exam   HPI Marissa Jimenez 47 y.o. female presents to office today for physical. She has complaints of left breast pain for 1 week. Menstrual cycle is due soon although she does endorse AUB with last cycle 7 months ago. There are no palpable breast lumps or abnormalities on physical exam today.  She is scheduled for mammogram in 2 weeks  Requesting referral for decreased libido.    Review of Systems  Constitutional:  Negative for fever, malaise/fatigue and weight loss.       Decreased libido  HENT: Negative.  Negative for nosebleeds.   Eyes: Negative.  Negative for blurred vision, double vision and photophobia.  Respiratory: Negative.  Negative for cough and shortness of breath.   Cardiovascular: Negative.  Negative for chest pain, palpitations and leg swelling.  Gastrointestinal: Negative.  Negative for heartburn, nausea and vomiting.  Genitourinary: Negative.   Musculoskeletal: Negative.  Negative for myalgias.  Skin:        Breast pain  Neurological: Negative.  Negative for dizziness, focal weakness, seizures and headaches.  Endo/Heme/Allergies: Negative.   Psychiatric/Behavioral: Negative.  Negative for suicidal ideas.     Past Medical History:  Diagnosis Date   Arthritis 2012   Diabetes mellitus without complication ([place])    Headache 2013    Subacromial bursitis 08/04/2013    Past Surgical History:  Procedure  Laterality Date   CESAREAN SECTION     two c-sections 2002, 2008   TUBAL LIGATION      Family History  Problem Relation Age of Onset   Hyperlipidemia Mother    Arthritis Mother    Diabetes Father    Diabetes Sister    Alcohol abuse Brother    Cancer Neg Hx     Social History Reviewed with no changes to be made today.   Outpatient Medications Prior to Visit  Medication Sig Dispense Refill   atorvastatin (LIPITOR) 20 MG tablet Take 1 tablet (20 mg total) by mouth daily. Please schedule an office visit! 90 tablet 1   Blood Glucose Monitoring Suppl (TRUE METRIX METER) w/Device KIT Use as instructed 1 kit 0   glucose blood (TRUE METRIX BLOOD GLUCOSE TEST) test strip Check blood glucose levels once a day by fingerstick. 100 each 6   ibuprofen (ADVIL) 800 MG tablet Take 1 tablet (800 mg total) by mouth every 8 (eight) hours as needed. For knee pain 60 tablet 2   lisinopril (ZESTRIL) 2.5 MG tablet Take 1 tablet (2.5 mg total) by mouth daily. Please schedule a PCP visit! 90 tablet 1   metFORMIN (GLUCOPHAGE) 500 MG tablet Take 1 tablet (500 mg total) by mouth daily with breakfast. 90 tablet 1   methocarbamol (ROBAXIN) 500 MG tablet Take 1 tablet (500 mg total) by mouth every 8 (eight) hours as needed for muscle spasms. FOR BACK PAIN 90 tablet 1   metoprolol succinate (TOPROL-XL) 25 MG 24 hr tablet Take 0.5 tablets (12.5 mg  total) by mouth daily. 45 tablet 1   omeprazole (PRILOSEC) 20 MG capsule Take 1 capsule (20 mg total) by mouth 2 (two) times daily before a meal. 180 capsule 1   TRUEplus Lancets 28G MISC Check blood glucose levels once a day by fingerstick 100 each 6   fluticasone (FLONASE) 50 MCG/ACT nasal spray Place 2 sprays into both nostrils daily. (Patient not taking: Reported on 10/16/2022) 16 g 6   No facility-administered medications prior to visit.    No Known Allergies     Objective:    BP 115/71   Pulse 69   Ht '5\' 4"'$  (1.626 m)   Wt 204 lb 6.4 oz (92.7 kg)   SpO2 100%    BMI 35.09 kg/m  Wt Readings from Last 3 Encounters:  10/16/22 204 lb 6.4 oz (92.7 kg)  07/10/22 202 lb 3.2 oz (91.7 kg)  06/24/21 191 lb 8 oz (86.9 kg)    Physical Exam Constitutional:      Appearance: She is well-developed.  HENT:     Head: Normocephalic and atraumatic.     Right Ear: Hearing, tympanic membrane, ear canal and external ear normal.     Left Ear: Hearing, tympanic membrane, ear canal and external ear normal.     Nose: Nose normal.     Right Turbinates: Not enlarged.     Left Turbinates: Not enlarged.     Mouth/Throat:     Lips: Pink.     Mouth: Mucous membranes are moist.     Dentition: No dental tenderness, gingival swelling, dental abscesses or gum lesions.     Pharynx: No oropharyngeal exudate.  Eyes:     General: No scleral icterus.       Right eye: No discharge.     Extraocular Movements: Extraocular movements intact.     Conjunctiva/sclera: Conjunctivae normal.     Pupils: Pupils are equal, round, and reactive to light.  Neck:     Thyroid: No thyromegaly.     Trachea: No tracheal deviation.  Cardiovascular:     Rate and Rhythm: Normal rate and regular rhythm.     Heart sounds: Normal heart sounds. No murmur heard.    No friction rub.  Pulmonary:     Effort: Pulmonary effort is normal. No accessory muscle usage or respiratory distress.     Breath sounds: Normal breath sounds. No decreased breath sounds, wheezing, rhonchi or rales.  Chest:     Chest wall: No mass.  Breasts:    Right: Normal.     Left: Normal.  Abdominal:     General: Bowel sounds are normal. There is no distension.     Palpations: Abdomen is soft. There is no mass.     Tenderness: There is no abdominal tenderness. There is no right CVA tenderness, left CVA tenderness, guarding or rebound.     Hernia: No hernia is present.  Musculoskeletal:        General: No tenderness or deformity. Normal range of motion.     Cervical back: Normal range of motion and neck supple.   Lymphadenopathy:     Cervical: No cervical adenopathy.     Upper Body:     Right upper body: No supraclavicular, axillary or pectoral adenopathy.     Left upper body: No supraclavicular, axillary or pectoral adenopathy.  Skin:    General: Skin is warm and dry.     Findings: No erythema.  Neurological:     Mental Status: She is alert and oriented to  person, place, and time.     Cranial Nerves: No cranial nerve deficit.     Motor: Motor function is intact.     Coordination: Coordination is intact. Coordination normal.     Gait: Gait is intact.     Deep Tendon Reflexes:     Reflex Scores:      Patellar reflexes are 1+ on the right side and 1+ on the left side. Psychiatric:        Attention and Perception: Attention normal.        Mood and Affect: Mood normal.        Speech: Speech normal.        Behavior: Behavior normal.        Thought Content: Thought content normal.        Judgment: Judgment normal.          Patient has been counseled extensively about nutrition and exercise as well as the importance of adherence with medications and regular follow-up. The patient was given clear instructions to go to ER or return to medical center if symptoms don't improve, worsen or new problems develop. The patient verbalized understanding.   Follow-up: Return in about 3 months (around 01/15/2023) for DM.   Gildardo Pounds, FNP-BC [facility] and [facility] [place], [place]   10/16/2022, 9:13 AM

## 2022-10-24 ENCOUNTER — Other Ambulatory Visit: Payer: Self-pay | Admitting: *Deleted

## 2022-10-24 ENCOUNTER — Ambulatory Visit: Admission: RE | Admit: 2022-10-24 | Payer: Self-pay | Source: Ambulatory Visit

## 2022-10-24 ENCOUNTER — Ambulatory Visit: Payer: Self-pay

## 2022-10-24 ENCOUNTER — Ambulatory Visit: Payer: Self-pay | Admitting: *Deleted

## 2022-10-24 VITALS — BP 112/71 | Wt 206.0 lb

## 2022-10-24 DIAGNOSIS — N644 Mastodynia: Secondary | ICD-10-CM

## 2022-10-24 DIAGNOSIS — Z1239 Encounter for other screening for malignant neoplasm of breast: Secondary | ICD-10-CM

## 2022-10-24 NOTE — Progress Notes (Signed)
[name] is a 47 y.o. female who presents to [facility] clinic today with complaint of left upper inner breast pain x 10 days that comes and goes. Patient states the pain radiates to the left outer and lower breast at times. Patient rates the pain at a 6 out of 10.   Pap Smear: Pap smear not completed today. Last Pap smear was 12/01/2019 at [facility] and normal with negative HPV. Per patient has no history of an abnormal Pap smear. Last Pap smear result is available in Epic.    Physical exam: Breasts Right breast slightly larger than left that per patient has not noticed any changes. No skin abnormalities bilateral breasts. No nipple retraction bilateral breasts. No nipple discharge bilateral breasts. No lymphadenopathy. No lumps palpated bilateral breasts. Complaints of left upper inner breast tenderness on exam.   MS DIGITAL SCREENING TOMO BILATERAL  Result Date: 04/02/2021 CLINICAL DATA:  Screening. EXAM: DIGITAL SCREENING BILATERAL MAMMOGRAM WITH TOMOSYNTHESIS AND CAD TECHNIQUE: Bilateral screening digital craniocaudal and mediolateral oblique mammograms were obtained. Bilateral screening digital breast tomosynthesis was performed. The images were evaluated with computer-aided detection. COMPARISON:  Previous exam(s). ACR Breast Density Category b: There are scattered areas of fibroglandular density. FINDINGS: There are no findings suspicious for malignancy. IMPRESSION: No mammographic evidence of malignancy. A result letter of this screening mammogram will be mailed directly to the patient. RECOMMENDATION: Screening mammogram in one year. (Code:SM-B-01Y) BI-RADS CATEGORY  1: Negative. Electronically Signed   By: Margarette Canada M.D.   On: 04/02/2021 13:10   MS DIGITAL SCREENING BILATERAL  Result Date: 01/12/2020 CLINICAL DATA:  Screening. EXAM: DIGITAL SCREENING BILATERAL MAMMOGRAM WITH CAD COMPARISON:  Previous exam(s). ACR Breast Density Category c: The breast  tissue is heterogeneously dense, which may obscure small masses. FINDINGS: There are no findings suspicious for malignancy. Images were processed with CAD. IMPRESSION: No mammographic evidence of malignancy. A result letter of this screening mammogram will be mailed directly to the patient. RECOMMENDATION: Screening mammogram in one year. (Code:SM-B-01Y) BI-RADS CATEGORY  1: Negative. Electronically Signed   By: Ammie Ferrier M.D.   On: 01/12/2020 13:20   MS DIGITAL SCREENING TOMO BILATERAL  Result Date: 12/26/2018 CLINICAL DATA:  Screening. EXAM: DIGITAL SCREENING BILATERAL MAMMOGRAM WITH TOMO AND CAD COMPARISON:  Previous exam(s). ACR Breast Density Category c: The breast tissue is heterogeneously dense, which may obscure small masses. FINDINGS: There are no findings suspicious for malignancy. Images were processed with CAD. IMPRESSION: No mammographic evidence of malignancy. A result letter of this screening mammogram will be mailed directly to the patient. RECOMMENDATION: Screening mammogram in one year. (Code:SM-B-01Y) BI-RADS CATEGORY  1: Negative. Electronically Signed   By: Claudie Revering M.D.   On: 12/26/2018 10:18   MS DIGITAL SCREENING BILATERAL  Result Date: 11/01/2017 CLINICAL DATA:  Screening. EXAM: DIGITAL SCREENING BILATERAL MAMMOGRAM WITH CAD COMPARISON:  Previous exam(s). ACR Breast Density Category c: The breast tissue is heterogeneously dense, which may obscure small masses. FINDINGS: There are no findings suspicious for malignancy. Images were processed with CAD. IMPRESSION: No mammographic evidence of malignancy. A result letter of this screening mammogram will be mailed directly to the patient. RECOMMENDATION: Screening mammogram in one year. (Code:SM-B-01Y) BI-RADS CATEGORY  1: Negative. Electronically Signed   By: Ammie Ferrier M.D.   On: 11/01/2017 15:20     Pelvic/Bimanual Pap is not indicated today per BCCCP guidelines.    Smoking History: Patient has never smoked.     Patient Navigation:  Patient education provided. Access to services provided for patient through [place] program. Spanish interpreter Marissa Jimenez from [facility] provided. Patient given information about food market at the [facility] and offered bag of food.   Colorectal Cancer Screening: Per patient has never had colonoscopy completed. FIT Test given to patient to complete at her appointment with PCP on 10/16/2022. Explained the importance of completing FIT Test. No complaints today.   Breast and Cervical Cancer Risk Assessment: Patient does not have family history of breast cancer, known genetic mutations, or radiation treatment to the chest before age 63. Patient does not have history of cervical dysplasia, immunocompromised, or DES exposure in-utero.  Risk Scores as of 10/24/2022     Marissa Jimenez           5-year 0.69 %   Lifetime 7.79 %   This patient is Hisp[place]/Latina but has no documented birth country, so the [place] used data from [place] patients to calculate their risk score. Document a birth country in the Demographics activity for a more accurate score.         Last calculated by Claretha Cooper, CMA on 10/24/2022 at  8:43 AM        A: BCCCP exam without pap smear Complaint of left upper inner breast pain.  P: Referred patient to the [facility] for a diagnostic mammogram. Appointment scheduled Tuesday, October 31, 2022 at [place].  Loletta Parish, RN 10/24/2022 9:11 AM

## 2022-10-24 NOTE — Patient Instructions (Signed)
Explained breast self awareness with Marissa Jimenez. Patient did not need a Pap smear today due to last Pap smear and HPV typing was 12/01/2019. Let her know BCCCP will cover Pap smears and HPV Typing every 5 years unless has a history of abnormal Pap smears. Referred patient to the [facility] for a diagnostic mammogram. Appointment scheduled Tuesday, October 31, 2022 at [place]. Patient aware of appointment and will be there. Marissa Jimenez verbalized understanding.  Syd Newsome, Arvil [place], RN 9:11 AM

## 2022-10-31 ENCOUNTER — Ambulatory Visit
Admission: RE | Admit: 2022-10-31 | Discharge: 2022-10-31 | Disposition: A | Discharge status: Home / Self Care | Payer: No Typology Code available for payment source | Source: Ambulatory Visit | Attending: Obstetrics and Gynecology | Admitting: Obstetrics and Gynecology

## 2022-10-31 ENCOUNTER — Ambulatory Visit: Admission: RE | Admit: 2022-10-31 | Payer: No Typology Code available for payment source | Source: Ambulatory Visit

## 2022-10-31 DIAGNOSIS — N644 Mastodynia: Secondary | ICD-10-CM

## 2022-12-11 ENCOUNTER — Other Ambulatory Visit: Payer: Self-pay

## 2022-12-13 ENCOUNTER — Other Ambulatory Visit: Payer: Self-pay

## 2022-12-13 NOTE — Progress Notes (Signed)
Patient seen by Wallene Huh, PharmD Candidate on 12/11/2022 while they were picking up prescriptions at [place] [facility]. Patient is Spanish speaking.   Blood pressure today was : 136/85, HR 77   Patient does not have an automated home blood pressure machine.   Medication review was performed. They are not taking medications as prescribed. Differences from their prescribed list include: Lisinopril 2.5 mg; Metoprolol Succinate 12.5 mg. Patient reports not taking. Last dispensed 07/10/22 x90 day supply.   The following barriers to adherence were noted:  - They do not have cost concerns.  - They do not have transportation concerns.  - They do not need assistance obtaining refills.  - They do not feel like one/some of their medications make them feel poorly.  - They do have follow up scheduled with their primary care provider/cardiologist.  - Patient reported she does not take medication for blood pressure at this time, but was aware of having medication for blood pressure.   The following interventions were completed:  - Medications were reviewed  - Patient was educated on goal blood pressures and long term health implications of elevated blood pressure   The patient has follow up scheduled:  PCP: 01/15/2023 with Dr. Raul Del.   Wallene Huh  PharmD Candidate   Joseph Art, Pharm.D. PGY-2 Ambulatory Care Pharmacy Resident

## 2023-01-15 ENCOUNTER — Ambulatory Visit: Payer: Self-pay | Admitting: Nurse Practitioner

## 2023-01-31 ENCOUNTER — Ambulatory Visit: Payer: Self-pay | Attending: Physician Assistant | Admitting: Physician Assistant

## 2023-03-13 ENCOUNTER — Other Ambulatory Visit: Payer: Self-pay

## 2023-03-13 ENCOUNTER — Encounter: Payer: Self-pay | Admitting: Family Medicine

## 2023-03-15 ENCOUNTER — Ambulatory Visit: Payer: Self-pay | Admitting: Physician Assistant

## 2023-03-15 ENCOUNTER — Ambulatory Visit: Payer: Self-pay

## 2023-03-15 ENCOUNTER — Other Ambulatory Visit: Payer: Self-pay

## 2023-03-15 ENCOUNTER — Encounter: Payer: Self-pay | Admitting: Physician Assistant

## 2023-03-15 VITALS — BP 124/73 | HR 72 | Ht 64.0 in | Wt 206.0 lb

## 2023-03-15 DIAGNOSIS — M545 Low back pain, unspecified: Secondary | ICD-10-CM

## 2023-03-15 DIAGNOSIS — M722 Plantar fascial fibromatosis: Secondary | ICD-10-CM

## 2023-03-15 MED ORDER — PREDNISONE 20 MG PO TABS
ORAL_TABLET | ORAL | 0 refills | Status: AC
  Filled 2023-03-15: qty 13, 8d supply, fill #0

## 2023-03-15 NOTE — Telephone Encounter (Signed)
Patient has OV with PCP on 03/26/2023

## 2023-03-15 NOTE — Patient Instructions (Signed)
You are going to take a prednisone taper to help with your heel pain.  I do encourage you to wear supportive shoes and socks.    Roney Jaffe, PA-C Physician Assistant [facility] [email or web address]    Dolor del pie Foot Pain El dolor del pie puede tener muchas causas. Las causas comunes incluyen las lesiones traumticas en el pie. Estas lesiones pueden ser esguinces o fracturas de huesos, o lesiones que [employer] nervios de los pies. Otras causas de dolor en el pie incluyen artritis, ampollas y juanetes. Para saber qu le causa el dolor en el pie, el mdico le preguntar por los detalles de sus sntomas. Tambin le har un examen fsico, as como pruebas de diagnstico por imgenes, como radiografas o una resonancia magntica (RM). Siga estas instrucciones en su casa: Control del dolor, la rigidez y la hinchazn  Si se lo indican, aplique hielo sobre la zona dolorida. Ponga el hielo en una bolsa plstica. Coloque una toalla entre la piel y Copy. Aplique el hielo durante 20 minutos, 2 a 3 veces por da. Si la piel se le pone de color rojo brillante, retire el hielo de inmediato para evitar daos en la piel. El Sparkman de dao es mayor si no puede sentir dolor, Airline pilot o fro. Actividad No permanezca de pie ni camine durante largos perodos. Haga ejercicios de estiramiento para Engineer, materials y la rigidez del pie como se lo haya indicado el mdico. No levante ningn objeto que pese ms de 10 lb (4.5 kg) o que supere el lmite de peso que le hayan indicado, Teacher, adult education que el mdico le diga que puede Cubero. Levantar mucho peso puede ejercer presin [employer]. Retome sus actividades normales como se lo haya indicado el mdico. Pregntele al mdico qu actividades son seguras para usted. Estilo de vida Use zapatos cmodos y anatmicos que tengan buen calce. No use zapatos con tacones altos. Mantenga los pies limpios y  secos. Instrucciones generales Use los medicamentos de venta libre y los recetados solamente como se lo haya indicado el mdico. Hgase masajes suaves en el pie. Est atento a los [employer]. Informe al mdico si los sntomas empeoran. Concurra a todas las visitas de seguimiento. El mdico querr Chief Operating Officer su progreso. Comunquese con un mdico si: Su dolor no mejora despus de [address] de [employer] El dolor Dolores. No puede apoyar el peso en el pie. El pie o los dedos de ese pie se le hinchan. El pie se le adormece o tiene hormigueo. Solicite ayuda de inmediato si: El pie o los dedos de ese pie se tornan de color blanco o [place]. Tiene el pie enrojecido y caliente al tacto. Esta informacin no tiene Theme park manager el consejo del mdico. Asegrese de hacerle al mdico cualquier pregunta que tenga. Document Revised: 08/02/2022 Document Reviewed: 08/02/2022 Elsevier Patient Education  2024 [employer].

## 2023-03-15 NOTE — Progress Notes (Signed)
Established Patient Office Visit  Subjective   Patient ID: Marissa Jimenez, female    DOB: [birth year]  Age: 47 y.o. MRN: [id number]  Chief Complaint  Patient presents with   Foot Pain    Both feet, painful feeling when standing or walking. Pain started 3-4 months ago, pain is worsening,    States that she has been experiencing pain in both heels, states the right hurts more than the left, states this has been ongoing for the past 3 to 4 months.  States that the pain tends to be present all the time but is worse as the day goes on.  States that she has been rolling her feet on frozen water bottles without much relief.  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  States that she wears a Event organiser.  States that she also has been experiencing intermittent bilateral low back pain.  States that this has also been going on for 3 to 4 months.  Has not tried anything for relief.  Daughter is present and acts as interpreter.  Patient declines clinic interpreter, release signed.       Past Medical History:  Diagnosis Date   Arthritis 2012   Diabetes mellitus without complication (HCC)    Headache 2013    Subacromial bursitis 08/04/2013   Social History   Socioeconomic History   Marital status: Married    Spouse name: Not on file   Number of children: 3   Years of education: 6   Highest education level: Not on file  Occupational History   Occupation: Homemaker   Tobacco Use   Smoking status: Never   Smokeless tobacco: Never  Vaping Use   Vaping Use: Never used  Substance and Sexual Activity   Alcohol use: No   Drug use: No   Sexual activity: Yes    Birth control/protection: Surgical  Other Topics Concern   Not on file  Social History Narrative   Lives in [place] with husband and three children. Stay at home mother.   Children age- 97, 72, 59.    From [place].   Lived in [place] for 17 years.    Social Determinants of Health    Financial Resource Strain: Not on file  Food Insecurity: Food Insecurity Present (10/24/2022)   Hunger Vital Sign    Worried About Running Out of Food in the Last Year: Sometimes true    Ran Out of Food in the Last Year: Sometimes true  Transportation Needs: No Transportation Needs (10/24/2022)   PRAPARE - Administrator, Civil Service (Medical): No    Lack of Transportation (Non-Medical): No  Physical Activity: Insufficiently Active (11/01/2017)   Exercise Vital Sign    Days of Exercise per Week: 2 days    Minutes of Exercise per Session: 60 min  Stress: No Stress Concern Present (11/01/2017)   [employer] of Occupational Health - Occupational Stress Questionnaire    Feeling of Stress : Not at all  Social Connections: Moderately Integrated (11/01/2017)   Social Connection and Isolation Panel [NHANES]    Frequency of Communication with Friends and Family: Twice a week    Frequency of Social Gatherings with Friends and Family: Once a week    Attends Religious Services: More than 4 times per year    Active Member of [employer] or Organizations: No    Attends Banker Meetings: Never    Marital Status: Living with partner  Intimate Programme researcher, broadcasting/film/video  Violence: Not At Risk (11/01/2017)   Humiliation, Afraid, Rape, and Kick questionnaire    Fear of Current or Ex-Partner: No    Emotionally Abused: No    Physically Abused: No    Sexually Abused: No   Family History  Problem Relation Age of Onset   Hyperlipidemia Mother    Arthritis Mother    Diabetes Father    Diabetes Sister    Alcohol abuse Brother    Cancer Neg Hx    No Known Allergies  Review of Systems  Constitutional: Negative.   HENT: Negative.    Eyes: Negative.   Respiratory:  Negative for shortness of breath.   Cardiovascular:  Negative for chest pain.  Gastrointestinal: Negative.   Genitourinary: Negative.   Musculoskeletal:  Positive for back pain.  Skin: Negative.   Neurological: Negative.    Endo/Heme/Allergies: Negative.   Psychiatric/Behavioral: Negative.        Objective:     BP 124/73 (BP Location: Left Arm, Patient Position: Sitting, Cuff Size: Large)   Pulse 72   Ht 5\' 4"  (1.626 m)   Wt 206 lb (93.4 kg)   SpO2 97%   BMI 35.36 kg/m  BP Readings from Last 3 Encounters:  03/15/23 124/73  12/13/22 136/85  10/24/22 112/71   Wt Readings from Last 3 Encounters:  03/15/23 206 lb (93.4 kg)  10/24/22 206 lb (93.4 kg)  10/16/22 204 lb 6.4 oz (92.7 kg)      Physical Exam Vitals and nursing note reviewed.  Constitutional:      Appearance: Normal appearance.  HENT:     Head: Normocephalic and atraumatic.     Right Ear: External ear normal.     Left Ear: External ear normal.     Nose: Nose normal.     Mouth/Throat:     Mouth: Mucous membranes are moist.     Pharynx: Oropharynx is clear.  Eyes:     Extraocular Movements: Extraocular movements intact.     Conjunctiva/sclera: Conjunctivae normal.     Pupils: Pupils are equal, round, and reactive to light.  Cardiovascular:     Rate and Rhythm: Normal rate and regular rhythm.     Pulses: Normal pulses.          Dorsalis pedis pulses are 2+ on the right side and 2+ on the left side.       Posterior tibial pulses are 2+ on the right side and 2+ on the left side.     Heart sounds: Normal heart sounds.  Pulmonary:     Effort: Pulmonary effort is normal.     Breath sounds: Normal breath sounds.  Musculoskeletal:        General: Normal range of motion.     Cervical back: Normal range of motion and neck supple.  Feet:     Comments: Tender to palpation, bilateral heels.  No swelling noted. Skin:    General: Skin is warm and dry.  Neurological:     General: No focal deficit present.     Mental Status: She is alert.  Psychiatric:        Mood and Affect: Mood normal.        Thought Content: Thought content normal.        Judgment: Judgment normal.        Assessment & Plan:   Problem List Items Addressed  This Visit   None Visit Diagnoses     Bilateral plantar fasciitis    -  Primary   Relevant  Medications   predniSONE (DELTASONE) 20 MG tablet   Acute bilateral low back pain without sciatica       Relevant Medications   predniSONE (DELTASONE) 20 MG tablet      1. Bilateral plantar fasciitis Trial prednisone taper.  Patient education given on supportive care, red flags given for prompt reevaluation.  Patient has upcoming appointment with primary care provider for general health maintenance. - predniSONE (DELTASONE) 20 MG tablet; Take 3 tablets (60 mg total) by mouth daily with breakfast for 2 days, THEN 2 tablets (40 mg total) daily with breakfast for 2 days, THEN 1 tablet (20 mg total) daily with breakfast for 2 days, THEN 0.5 tablets (10 mg total) daily with breakfast for 2 days.  Dispense: 13 tablet; Refill: 0  2. Acute bilateral low back pain without sciatica Patient education given on supportive care   I have reviewed the patient's medical history (PMH, PSH, Social History, Family History, Medications, and allergies) , and have been updated if relevant. I spent 30 minutes reviewing chart and  face to face time with patient.    Return if symptoms worsen or fail to improve.    Kasandra Knudsen Mayers, PA-C

## 2023-03-15 NOTE — Telephone Encounter (Signed)
Used Scientist, physiological # [phone]

## 2023-03-15 NOTE — Telephone Encounter (Signed)
  Chief Complaint: bilateral heel pain and righ shoulder pain that radiates down to back  Symptoms: severe heel pain worse than back pain- difficulty walking and standing, Frequency: 3 months Pertinent Negatives: Patient denies redness or selling, chest pain  Disposition: [] ED /[] Urgent Care (no appt availability in office) / [] Appointment(In office/virtual)/ []  [facility]/ [] Home Care/ [] Refused Recommended Disposition /[x] [facility]/ []  Follow-up with PCP Additional Notes:- Answer Assessment - Initial Assessment Questions 1. ONSET: "When did the pain start?"      3 months  2. LOCATION: "Where is the pain located?"      Legs and heels  3. PAIN: "How bad is the pain?"    (Scale 1-10; or mild, moderate, severe)   -  MILD (1-3): doesn't interfere with normal activities    -  MODERATE (4-7): interferes with normal activities (e.g., work or school) or awakens from sleep, limping    -  SEVERE (8-10): excruciating pain, unable to do any normal activities, unable to walk     Moderate to shoulder -- severe with standing no pain laying down  10/10  4. WORK OR EXERCISE: "Has there been any recent work or exercise that involved this part of the body?"      No  5. CAUSE: "What do you think is causing the leg pain?"     unsure 6. OTHER SYMPTOMS: "Do you have any other symptoms?" (e.g., chest pain, back pain, breathing difficulty, swelling, rash, fever, numbness, weakness)     Back pain Near shoulder right then radiates // heels stronger pain constant not red-  7. PREGNANCY: "Is there any chance you are pregnant?" "When was your last menstrual period?"     N/a  Protocols used: Leg Pain-A-AH

## 2023-03-16 ENCOUNTER — Other Ambulatory Visit: Payer: Self-pay

## 2023-03-26 ENCOUNTER — Other Ambulatory Visit: Payer: Self-pay | Admitting: Nurse Practitioner

## 2023-03-26 ENCOUNTER — Ambulatory Visit: Payer: Self-pay | Attending: Nurse Practitioner | Admitting: Nurse Practitioner

## 2023-03-26 ENCOUNTER — Encounter: Payer: Self-pay | Admitting: Nurse Practitioner

## 2023-03-26 ENCOUNTER — Other Ambulatory Visit: Payer: Self-pay

## 2023-03-26 VITALS — BP 123/81 | HR 80 | Ht 64.0 in | Wt 208.6 lb

## 2023-03-26 DIAGNOSIS — E785 Hyperlipidemia, unspecified: Secondary | ICD-10-CM

## 2023-03-26 DIAGNOSIS — E1165 Type 2 diabetes mellitus with hyperglycemia: Secondary | ICD-10-CM

## 2023-03-26 DIAGNOSIS — Z7984 Long term (current) use of oral hypoglycemic drugs: Secondary | ICD-10-CM

## 2023-03-26 DIAGNOSIS — Z1211 Encounter for screening for malignant neoplasm of colon: Secondary | ICD-10-CM

## 2023-03-26 DIAGNOSIS — R002 Palpitations: Secondary | ICD-10-CM

## 2023-03-26 LAB — POCT GLYCOSYLATED HEMOGLOBIN (HGB A1C): HbA1c, POC (controlled diabetic range): 6.5 % (ref 0.0–7.0)

## 2023-03-26 MED ORDER — METFORMIN HCL 500 MG PO TABS
500.0000 mg | ORAL_TABLET | Freq: Every day | ORAL | 1 refills | Status: DC
Start: 2023-03-26 — End: 2023-07-02
  Filled 2023-03-26: qty 90, 90d supply, fill #0

## 2023-03-26 MED ORDER — LISINOPRIL 2.5 MG PO TABS
2.5000 mg | ORAL_TABLET | Freq: Every day | ORAL | 1 refills | Status: DC
Start: 2023-03-26 — End: 2023-10-15
  Filled 2023-03-26: qty 90, 90d supply, fill #0

## 2023-03-26 MED ORDER — TRUEPLUS LANCETS 28G MISC
6 refills | Status: DC
Start: 2023-03-26 — End: 2023-07-02
  Filled 2023-03-26: qty 100, 100d supply, fill #0

## 2023-03-26 MED ORDER — METOPROLOL SUCCINATE ER 25 MG PO TB24
12.5000 mg | ORAL_TABLET | Freq: Every day | ORAL | 1 refills | Status: DC
Start: 2023-03-26 — End: 2023-10-15
  Filled 2023-03-26: qty 45, 90d supply, fill #0

## 2023-03-26 MED ORDER — TRUE METRIX METER W/DEVICE KIT
PACK | 0 refills | Status: DC
Start: 2023-03-26 — End: 2023-07-02
  Filled 2023-03-26: qty 1, 30d supply, fill #0

## 2023-03-26 MED ORDER — TRUE METRIX BLOOD GLUCOSE TEST VI STRP
ORAL_STRIP | 6 refills | Status: DC
Start: 2023-03-26 — End: 2023-07-02
  Filled 2023-03-26: qty 100, 100d supply, fill #0

## 2023-03-26 MED ORDER — ATORVASTATIN CALCIUM 20 MG PO TABS
20.0000 mg | ORAL_TABLET | Freq: Every day | ORAL | 1 refills | Status: DC
Start: 2023-03-26 — End: 2023-07-02
  Filled 2023-03-26: qty 90, 90d supply, fill #0

## 2023-03-26 NOTE — Progress Notes (Signed)
Assessment & Plan:  Marissa Jimenez was seen today for diabetes.  Diagnoses and all orders for this visit:  Type 2 diabetes mellitus with hyperglycemia, without long-term current use of insulin (HCC) -     POCT glycosylated hemoglobin (Hb A1C) -     Ambulatory referral to Podiatry -     lisinopril (ZESTRIL) 2.5 MG tablet; Take 1 tablet (2.5 mg total) by mouth daily. To help protect kidneys -     metFORMIN (GLUCOPHAGE) 500 MG tablet; Take 1 tablet (500 mg total) by mouth daily with breakfast. FOR DIABETES -     Blood Glucose Monitoring Suppl (TRUE METRIX METER) w/Device KIT; Use as instructed -     glucose blood (TRUE METRIX BLOOD GLUCOSE TEST) test strip; Check blood glucose levels once a day by fingerstick. -     TRUEplus Lancets 28G MISC; Check blood glucose levels once a day by fingerstick -     CMP14+EGFR  Hyperlipidemia LDL goal <70 -     atorvastatin (LIPITOR) 20 MG tablet; Take 1 tablet (20 mg total) by mouth daily. For cholesterol  Heart palpitations -     metoprolol succinate (TOPROL-XL) 25 MG 24 hr tablet; Take 0.5 tablets (12.5 mg total) by mouth daily. -     CBC with Differential  Colon cancer screening -     Fecal occult blood, imunochemical([facility])    Patient has been counseled on age-appropriate routine health concerns for screening and prevention. These are reviewed and up-to-date. Referrals have been placed accordingly. Immunizations are up-to-date or declined.    Subjective:   Chief Complaint  Patient presents with   Diabetes   Diabetes Pertinent negatives for hypoglycemia include no dizziness, headaches or seizures. Pertinent negatives for diabetes include no blurred vision, no chest pain and no weight loss.   Marissa Jimenez 47 y.o. female presents to office today for follow up to diabetes which has been well controlled. She is currently taking metformin 500 mg daily as prescribed. On renal dose ACE and low intensity statin. LDL near goal and BP is  well controlled. A1c has slightly increased.  Lab Results  Component Value Date   HGBA1C 6.5 03/26/2023    Lab Results  Component Value Date   HGBA1C 6.3 07/10/2022   Lab Results  Component Value Date   LDLCALC 78 07/10/2022    BP Readings from Last 3 Encounters:  03/26/23 123/81  03/15/23 124/73  12/13/22 136/85      She endorses bilateral heel pain. Worse with weight bearing and walking.   Review of Systems  Constitutional:  Negative for fever, malaise/fatigue and weight loss.  HENT: Negative.  Negative for nosebleeds.   Eyes: Negative.  Negative for blurred vision, double vision and photophobia.  Respiratory: Negative.  Negative for cough and shortness of breath.   Cardiovascular: Negative.  Negative for chest pain, palpitations and leg swelling.  Gastrointestinal: Negative.  Negative for heartburn, nausea and vomiting.  Musculoskeletal:  Positive for joint pain. Negative for myalgias.  Neurological: Negative.  Negative for dizziness, focal weakness, seizures and headaches.  Psychiatric/Behavioral: Negative.  Negative for suicidal ideas.     Past Medical History:  Diagnosis Date   Arthritis 2012   Diabetes mellitus without complication (HCC)    Headache 2013    Subacromial bursitis 08/04/2013    Past Surgical History:  Procedure Laterality Date   CESAREAN SECTION     two c-sections 2002, 2008   TUBAL LIGATION      Family History  Problem  Relation Age of Onset   Hyperlipidemia Mother    Arthritis Mother    Diabetes Father    Diabetes Sister    Alcohol abuse Brother    Cancer Neg Hx     Social History Reviewed with no changes to be made today.   Outpatient Medications Prior to Visit  Medication Sig Dispense Refill   fluticasone (FLONASE) 50 MCG/ACT nasal spray Place 2 sprays into both nostrils daily. 16 g 6   atorvastatin (LIPITOR) 20 MG tablet Take 1 tablet (20 mg total) by mouth daily. Please schedule an office visit! 90 tablet 1   Blood Glucose  Monitoring Suppl (TRUE METRIX METER) w/Device KIT Use as instructed 1 kit 0   glucose blood (TRUE METRIX BLOOD GLUCOSE TEST) test strip Check blood glucose levels once a day by fingerstick. 100 each 6   lisinopril (ZESTRIL) 2.5 MG tablet Take 1 tablet (2.5 mg total) by mouth daily. Please schedule a PCP visit! 90 tablet 1   metFORMIN (GLUCOPHAGE) 500 MG tablet Take 1 tablet (500 mg total) by mouth daily with breakfast. 90 tablet 1   metoprolol succinate (TOPROL-XL) 25 MG 24 hr tablet Take 0.5 tablets (12.5 mg total) by mouth daily. 45 tablet 1   TRUEplus Lancets 28G MISC Check blood glucose levels once a day by fingerstick 100 each 6   omeprazole (PRILOSEC) 20 MG capsule Take 1 capsule (20 mg total) by mouth 2 (two) times daily before a meal. (Patient not taking: Reported on 10/24/2022) 180 capsule 1   ibuprofen (ADVIL) 800 MG tablet Take 1 tablet (800 mg total) by mouth every 8 (eight) hours as needed. For knee pain (Patient not taking: Reported on 03/26/2023) 60 tablet 2   methocarbamol (ROBAXIN) 500 MG tablet Take 1 tablet (500 mg total) by mouth every 8 (eight) hours as needed for muscle spasms. FOR BACK PAIN (Patient not taking: Reported on 03/26/2023) 90 tablet 1   No facility-administered medications prior to visit.    No Known Allergies     Objective:    BP 123/81 (BP Location: Left Arm, Patient Position: Sitting, Cuff Size: Normal)   Pulse 80   Ht 5\' 4"  (1.626 m)   Wt 208 lb 9.6 oz (94.6 kg)   SpO2 99%   BMI 35.81 kg/m  Wt Readings from Last 3 Encounters:  03/26/23 208 lb 9.6 oz (94.6 kg)  03/15/23 206 lb (93.4 kg)  10/24/22 206 lb (93.4 kg)    Physical Exam Vitals and nursing note reviewed.  Constitutional:      Appearance: She is well-developed.  HENT:     Head: Normocephalic and atraumatic.  Cardiovascular:     Rate and Rhythm: Normal rate and regular rhythm.     Heart sounds: Normal heart sounds. No murmur heard.    No friction rub. No gallop.  Pulmonary:      Effort: Pulmonary effort is normal. No tachypnea or respiratory distress.     Breath sounds: Normal breath sounds. No decreased breath sounds, wheezing, rhonchi or rales.  Chest:     Chest wall: No tenderness.  Abdominal:     General: Bowel sounds are normal.     Palpations: Abdomen is soft.  Musculoskeletal:        General: Normal range of motion.     Cervical back: Normal range of motion.  Skin:    General: Skin is warm and dry.  Neurological:     Mental Status: She is alert and oriented to person, place, and time.  Coordination: Coordination normal.  Psychiatric:        Behavior: Behavior normal. Behavior is cooperative.        Thought Content: Thought content normal.        Judgment: Judgment normal.          Patient has been counseled extensively about nutrition and exercise as well as the importance of adherence with medications and regular follow-up. The patient was given clear instructions to go to ER or return to medical center if symptoms don't improve, worsen or new problems develop. The patient verbalized understanding.   Follow-up: Return in about 3 months (around 06/26/2023) for PAP SMEAR.   Claiborne Rigg, FNP-BC [facility] [place], [place] [phone]   03/26/2023, 3:32 PM

## 2023-03-27 ENCOUNTER — Other Ambulatory Visit: Payer: Self-pay

## 2023-03-27 LAB — CBC WITH DIFFERENTIAL/PLATELET
Basophils Absolute: 0.1 10*3/uL (ref 0.0–0.2)
Basos: 1 %
EOS (ABSOLUTE): 0.2 10*3/uL (ref 0.0–0.4)
Eos: 2 %
Hematocrit: 39.5 % (ref 34.0–46.6)
Hemoglobin: 12.8 g/dL (ref 11.1–15.9)
Immature Grans (Abs): 0.1 10*3/uL (ref 0.0–0.1)
Immature Granulocytes: 1 %
Lymphocytes Absolute: 2.7 10*3/uL (ref 0.7–3.1)
Lymphs: 30 %
MCH: 30.2 pg (ref 26.6–33.0)
MCHC: 32.4 g/dL (ref 31.5–35.7)
MCV: 93 fL (ref 79–97)
Monocytes Absolute: 0.8 10*3/uL (ref 0.1–0.9)
Monocytes: 9 %
Neutrophils Absolute: 5.2 10*3/uL (ref 1.4–7.0)
Neutrophils: 57 %
Platelets: 287 10*3/uL (ref 150–450)
RBC: 4.24 x10E6/uL (ref 3.77–5.28)
RDW: 14 % (ref 11.7–15.4)
WBC: 9 10*3/uL (ref 3.4–10.8)

## 2023-03-27 LAB — CMP14+EGFR
ALT: 26 IU/L (ref 0–32)
AST: 14 IU/L (ref 0–40)
Albumin: 4.4 g/dL (ref 3.9–4.9)
Alkaline Phosphatase: 65 IU/L (ref 44–121)
BUN/Creatinine Ratio: 16 (ref 9–23)
BUN: 9 mg/dL (ref 6–24)
Bilirubin Total: 0.2 mg/dL (ref 0.0–1.2)
CO2: 23 mmol/L (ref 20–29)
Calcium: 9.4 mg/dL (ref 8.7–10.2)
Chloride: 99 mmol/L (ref 96–106)
Creatinine, Ser: 0.58 mg/dL (ref 0.57–1.00)
Globulin, Total: 2.8 g/dL (ref 1.5–4.5)
Glucose: 94 mg/dL (ref 70–99)
Potassium: 4.2 mmol/L (ref 3.5–5.2)
Sodium: 147 mmol/L — ABNORMAL HIGH (ref 134–144)
Total Protein: 7.2 g/dL (ref 6.0–8.5)
eGFR: 112 mL/min/{1.73_m2} (ref >59)

## 2023-04-01 LAB — FECAL OCCULT BLOOD, IMMUNOCHEMICAL: Fecal Occult Bld: POSITIVE — AB

## 2023-04-02 ENCOUNTER — Other Ambulatory Visit: Payer: Self-pay | Admitting: Nurse Practitioner

## 2023-04-02 DIAGNOSIS — Z1211 Encounter for screening for malignant neoplasm of colon: Secondary | ICD-10-CM

## 2023-04-02 DIAGNOSIS — R195 Other fecal abnormalities: Secondary | ICD-10-CM

## 2023-04-09 ENCOUNTER — Telehealth: Payer: Self-pay | Admitting: Nurse Practitioner

## 2023-04-09 ENCOUNTER — Telehealth: Payer: Self-pay

## 2023-04-09 NOTE — Telephone Encounter (Signed)
Patient came in requesting someone to call her for recent lab results. Verified phone number [phone]

## 2023-04-09 NOTE — Telephone Encounter (Signed)
Return call unanswered by patient.  

## 2023-04-09 NOTE — Telephone Encounter (Signed)
Using [employer] ZO#[id number].  Pt given lab results per notes of Zelda,NP on 03/28/23 and 04/02/23. Pt verbalized understanding. Advised patient that the referral coordinator will contact her with next steps. Patient mention that sometimes when she has a bowel movement she see blood in the stool when it is hard nut it has not happened recently.     Claiborne Rigg, NP 04/02/2023  2:15 PM EDT     Stool test positive for blood. Referred to Gastroenterology for possible colonoscopy.   Claiborne Rigg, NP 03/28/2023  2:58 PM EDT Back to Top    Kidney, liver function and electrolytes are normal.     CBC does not indicate any anemia or bleeding disorders

## 2023-04-10 NOTE — Telephone Encounter (Signed)
Noted  

## 2023-04-11 ENCOUNTER — Encounter: Payer: Self-pay | Admitting: Gastroenterology

## 2023-04-13 ENCOUNTER — Telehealth: Payer: Self-pay

## 2023-04-13 NOTE — Telephone Encounter (Signed)
Dr. Lavon Paganini please see attached message. Please advise if patient needs an OV or direct to hospital.  Thank you

## 2023-04-13 NOTE — Telephone Encounter (Signed)
Marissa Jimenez,  Due to her ACM, this pt is at very high risk for difficult intubation; her procedure will need to be done at the hospital.  Thanks,  Cathlyn Parsons

## 2023-04-13 NOTE — Telephone Encounter (Signed)
Marissa Jimenez,   Can you please review this patients chart for [facility] clearance. 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 Please advise.  Thank you

## 2023-04-16 ENCOUNTER — Ambulatory Visit: Payer: Self-pay

## 2023-04-16 NOTE — Telephone Encounter (Signed)
Please schedule direct colonoscopy at hospital next available appointment if patient wants to proceed with that and she has no active GI complaints.  Thank you

## 2023-04-16 NOTE — Telephone Encounter (Signed)
Hey Beth,   Please see attached messages, Pt will need to be added to hospital lift.

## 2023-04-17 ENCOUNTER — Other Ambulatory Visit: Payer: Self-pay

## 2023-04-17 DIAGNOSIS — Z1211 Encounter for screening for malignant neoplasm of colon: Secondary | ICD-10-CM

## 2023-04-17 NOTE — Telephone Encounter (Signed)
Hey I have contacted the patient. She will come to see you for her instructions as scheduled. Her case has been moved to the hospital. New date is 05/28/23 at 10:00 am. Arrive for that one 8:30 am. Thank you

## 2023-04-23 ENCOUNTER — Ambulatory Visit: Payer: Self-pay | Admitting: Nurse Practitioner

## 2023-04-26 ENCOUNTER — Ambulatory Visit (AMBULATORY_SURGERY_CENTER): Payer: Self-pay | Admitting: *Deleted

## 2023-04-26 VITALS — Ht 64.0 in | Wt 211.0 lb

## 2023-04-26 DIAGNOSIS — Z1211 Encounter for screening for malignant neoplasm of colon: Secondary | ICD-10-CM

## 2023-04-26 NOTE — Progress Notes (Signed)
Pre visit conducted in person with interpretor Byrd Hesselbach) and patient's daughter who speaks [place].  Sample of PLENVU provided.   No egg or soy allergy known to patient  No issues known to pt with past sedation with any surgeries or procedures Patient denies ever being told they had issues or difficulty with intubation  No FH of Malignant Hyperthermia Pt is not on diet pills Pt is not on  home 02  Pt is not on blood thinners  Pt denies issues with constipation  No A fib or A flutter Have any cardiac testing pending--no Pt instructed to use Singlecare.com or GoodRx for a price reduction on prep

## 2023-04-26 NOTE — Progress Notes (Deleted)
.  egg 

## 2023-05-08 ENCOUNTER — Encounter: Payer: Self-pay | Admitting: Gastroenterology

## 2023-05-21 ENCOUNTER — Encounter (HOSPITAL_COMMUNITY): Payer: Self-pay | Admitting: Gastroenterology

## 2023-05-28 ENCOUNTER — Encounter (HOSPITAL_COMMUNITY): Payer: Self-pay | Admitting: Gastroenterology

## 2023-05-28 ENCOUNTER — Ambulatory Visit (HOSPITAL_COMMUNITY)
Admission: RE | Admit: 2023-05-28 | Discharge: 2023-05-28 | Disposition: A | Discharge status: Home / Self Care | Payer: Self-pay | Attending: Gastroenterology | Admitting: Gastroenterology

## 2023-05-28 ENCOUNTER — Ambulatory Visit (HOSPITAL_COMMUNITY): Payer: Self-pay | Admitting: Anesthesiology

## 2023-05-28 ENCOUNTER — Encounter (HOSPITAL_COMMUNITY)
Admission: RE | Disposition: A | Discharge status: Home / Self Care | Payer: Self-pay | Source: Home / Self Care | Attending: Gastroenterology

## 2023-05-28 ENCOUNTER — Ambulatory Visit (HOSPITAL_BASED_OUTPATIENT_CLINIC_OR_DEPARTMENT_OTHER): Payer: Self-pay | Admitting: Anesthesiology

## 2023-05-28 DIAGNOSIS — I10 Essential (primary) hypertension: Secondary | ICD-10-CM | POA: Insufficient documentation

## 2023-05-28 DIAGNOSIS — K648 Other hemorrhoids: Secondary | ICD-10-CM

## 2023-05-28 DIAGNOSIS — Z7984 Long term (current) use of oral hypoglycemic drugs: Secondary | ICD-10-CM | POA: Insufficient documentation

## 2023-05-28 DIAGNOSIS — K5730 Diverticulosis of large intestine without perforation or abscess without bleeding: Secondary | ICD-10-CM | POA: Insufficient documentation

## 2023-05-28 DIAGNOSIS — Z79899 Other long term (current) drug therapy: Secondary | ICD-10-CM | POA: Insufficient documentation

## 2023-05-28 DIAGNOSIS — K219 Gastro-esophageal reflux disease without esophagitis: Secondary | ICD-10-CM | POA: Insufficient documentation

## 2023-05-28 DIAGNOSIS — K644 Residual hemorrhoidal skin tags: Secondary | ICD-10-CM | POA: Insufficient documentation

## 2023-05-28 DIAGNOSIS — E119 Type 2 diabetes mellitus without complications: Secondary | ICD-10-CM | POA: Insufficient documentation

## 2023-05-28 DIAGNOSIS — Z1211 Encounter for screening for malignant neoplasm of colon: Secondary | ICD-10-CM

## 2023-05-28 HISTORY — PX: COLONOSCOPY WITH PROPOFOL: SHX5780

## 2023-05-28 LAB — GLUCOSE, CAPILLARY
Glucose-Capillary: 132 mg/dL — ABNORMAL HIGH (ref 70–99)
Glucose-Capillary: 113 mg/dL — ABNORMAL HIGH (ref 70–99)

## 2023-05-28 SURGERY — COLONOSCOPY WITH PROPOFOL
Anesthesia: Monitor Anesthesia Care

## 2023-05-28 MED ORDER — LACTATED RINGERS IV SOLN
INTRAVENOUS | Status: AC | PRN
  Administered 2023-05-28: 1000 mL via INTRAVENOUS

## 2023-05-28 MED ORDER — LIDOCAINE HCL (PF) 2 % IJ SOLN
INTRAMUSCULAR | Status: DC | PRN
  Administered 2023-05-28: 80 mg via INTRADERMAL

## 2023-05-28 MED ORDER — PROPOFOL 500 MG/50ML IV EMUL
INTRAVENOUS | Status: DC | PRN
  Administered 2023-05-28: 110 ug/kg/min via INTRAVENOUS

## 2023-05-28 MED ORDER — SODIUM CHLORIDE 0.9 % IV SOLN: INTRAVENOUS | Status: DC

## 2023-05-28 MED ORDER — PROPOFOL 10 MG/ML IV BOLUS
INTRAVENOUS | Status: DC | PRN
  Administered 2023-05-28: 80 mg via INTRAVENOUS

## 2023-05-28 MED ORDER — PROPOFOL 500 MG/50ML IV EMUL
INTRAVENOUS | Status: AC
  Filled 2023-05-28: qty 50

## 2023-05-28 SURGICAL SUPPLY — 22 items

## 2023-05-28 NOTE — Transfer of Care (Signed)
Immediate Anesthesia Transfer of Care Note  Patient: Marissa Jimenez  Procedure(s) Performed: COLONOSCOPY WITH PROPOFOL  Patient Location: PACU and Endoscopy Unit  Anesthesia Type:MAC  Level of Consciousness: awake, alert , oriented, and patient cooperative  Airway & Oxygen Therapy: Patient Spontanous Breathing and Patient connected to face mask oxygen  Post-op Assessment: Report given to RN, Post -op Vital signs reviewed and stable, and Patient moving all extremities  Post vital signs: Reviewed and stable  Last Vitals:  Vitals Value Taken Time  BP 109/64 05/28/23 1035  Temp 36.3 C 05/28/23 1035  Pulse 64 05/28/23 1038  Resp 12 05/28/23 1038  SpO2 99 % 05/28/23 1038  Vitals shown include unfiled device data.  Last Pain:  Vitals:   05/28/23 1035  TempSrc: Temporal  PainSc: 0-No pain         Complications: No notable events documented.

## 2023-05-28 NOTE — Anesthesia Postprocedure Evaluation (Signed)
Anesthesia Post Note  Patient: Marissa Jimenez  Procedure(s) Performed: COLONOSCOPY WITH PROPOFOL     Patient location during evaluation: PACU Anesthesia Type: MAC Level of consciousness: awake and alert Pain management: pain level controlled Vital Signs Assessment: post-procedure vital signs reviewed and stable Respiratory status: spontaneous breathing Cardiovascular status: stable Anesthetic complications: no   No notable events documented.  Last Vitals:  Vitals:   05/28/23 1053 05/28/23 1100  BP:  (!) 115/51  Pulse: 62 (!) 58  Resp: 15 14  Temp:    SpO2: 100% 100%    Last Pain:  Vitals:   05/28/23 1100  TempSrc:   PainSc: 0-No pain                 [name]

## 2023-05-28 NOTE — Discharge Instructions (Signed)

## 2023-05-28 NOTE — H&P (Signed)
[facility] History and Physical   Primary Care Physician:  Claiborne Rigg, NP   Reason for Procedure:   Colon cancer screening  Plan:    Colonoscopy with possible intervention     HPI: Marissa Jimenez is a 47 y.o. female here for screening colonoscopy.  The risks and benefits as well as alternatives of endoscopic procedure(s) have been discussed and reviewed. All questions answered. The patient agrees to proceed.    Past Medical History:  Diagnosis Date   Arthritis 2012   Chiari malformation type I (HCC)    Diabetes mellitus without complication (HCC)    Headache 2013   Subacromial bursitis 08/04/2013    Past Surgical History:  Procedure Laterality Date   CESAREAN SECTION     two c-sections 2002, 2008   TUBAL LIGATION      Prior to Admission medications   Medication Sig Start Date End Date Taking? Authorizing Provider  atorvastatin (LIPITOR) 20 MG tablet Take 1 tablet (20 mg total) by mouth daily. For cholesterol 03/26/23  Yes Claiborne Rigg, NP  Blood Glucose Monitoring Suppl (TRUE METRIX METER) w/Device KIT Use as instructed 03/26/23  Yes Claiborne Rigg, NP  glucose blood (TRUE METRIX BLOOD GLUCOSE TEST) test strip Check blood glucose levels once a day by fingerstick. 03/26/23  Yes Claiborne Rigg, NP  lisinopril (ZESTRIL) 2.5 MG tablet Take 1 tablet (2.5 mg total) by mouth daily. To help protect kidneys 03/26/23  Yes Claiborne Rigg, NP  metFORMIN (GLUCOPHAGE) 500 MG tablet Take 1 tablet (500 mg total) by mouth daily with breakfast. FOR DIABETES 03/26/23  Yes Claiborne Rigg, NP  metoprolol succinate (TOPROL-XL) 25 MG 24 hr tablet Take 0.5 tablets (12.5 mg total) by mouth daily. 03/26/23  Yes Claiborne Rigg, NP  omeprazole (PRILOSEC) 20 MG capsule Take 1 capsule (20 mg total) by mouth 2 (two) times daily before a meal. 07/10/22  Yes Claiborne Rigg, NP  PEG-KCl-NaCl-NaSulf-Na Asc-C (PLENVU PO) Take 1 kit by mouth once.   Yes [provider]  TRUEplus Lancets 28G MISC Check blood glucose levels once a day by fingerstick 03/26/23  Yes Claiborne Rigg, NP  fluticasone (FLONASE) 50 MCG/ACT nasal spray Place 2 sprays into both nostrils daily. 10/30/17   Claiborne Rigg, NP    Current Facility-Administered Medications  Medication Dose Route Frequency Provider Last Rate Last Admin   0.9 %  sodium chloride infusion   Intravenous Continuous Louretta Tantillo, Eleonore Chiquito, MD       lactated ringers infusion    Continuous PRN Napoleon Form, MD 10 mL/hr at 05/28/23 0850 1,000 mL at 05/28/23 0850    Allergies as of 04/17/2023   (No Known Allergies)    Family History  Problem Relation Age of Onset   Hyperlipidemia Mother    Arthritis Mother    Diabetes Father    Diabetes Sister    Alcohol abuse Brother    Cancer Neg Hx    Stomach cancer Neg Hx     Social History   Socioeconomic History   Marital status: Married    Spouse name: Not on file   Number of children: 3   Years of education: 6   Highest education level: Not on file  Occupational History   Occupation: Homemaker   Tobacco Use   Smoking status: Never   Smokeless tobacco: Never  Vaping Use   Vaping status: Never Used  Substance and Sexual Activity   Alcohol use: No  Drug use: No   Sexual activity: Yes    Birth control/protection: Surgical  Other Topics Concern   Not on file  Social History Narrative   Lives in [place] with husband and three children. Stay at home mother.   Children age- 108, 57, 10.    From [place].   Lived in [place] for 17 years.    Social Determinants of Health   Financial Resource Strain: Not on file  Food Insecurity: Food Insecurity Present (10/24/2022)   Hunger Vital Sign    Worried About Running Out of Food in the Last Year: Sometimes true    Ran Out of Food in the Last Year: Sometimes true  Transportation Needs: No Transportation Needs (10/24/2022)   PRAPARE - Administrator, Civil Service (Medical): No    Lack  of Transportation (Non-Medical): No  Physical Activity: Insufficiently Active (11/01/2017)   Exercise Vital Sign    Days of Exercise per Week: 2 days    Minutes of Exercise per Session: 60 min  Stress: No Stress Concern Present (11/01/2017)   [employer] of Occupational Health - Occupational Stress Questionnaire    Feeling of Stress : Not at all  Social Connections: Moderately Integrated (11/01/2017)   Social Connection and Isolation Panel [NHANES]    Frequency of Communication with Friends and Family: Twice a week    Frequency of Social Gatherings with Friends and Family: Once a week    Attends Religious Services: More than 4 times per year    Active Member of [employer] or Organizations: No    Attends Banker Meetings: Never    Marital Status: Living with partner  Intimate Partner Violence: Not At Risk (11/01/2017)   Humiliation, Afraid, Rape, and Kick questionnaire    Fear of Current or Ex-Partner: No    Emotionally Abused: No    Physically Abused: No    Sexually Abused: No    Review of Systems:  All other review of systems negative except as mentioned in the HPI.  Physical Exam: Vital signs in last 24 hours: Temp:  [97.3 F (36.3 C)] 97.3 F (36.3 C) (08/19 0830) Pulse Rate:  [68] 68 (08/19 0830) Resp:  [11] 11 (08/19 0830) BP: (130)/(71) 130/71 (08/19 0830) SpO2:  [96 %] 96 % (08/19 0830) Weight:  [81.6 kg] 81.6 kg (08/19 0830)   General:   Alert, NAD Lungs:  Clear .   Heart:  Regular rate and rhythm Abdomen:  Soft, nontender and nondistended. Neuro/Psych:  Alert and cooperative. Normal mood and affect. A and O x 3   K. Scherry Ran , MD [phone]

## 2023-05-28 NOTE — Op Note (Signed)
[facility] Patient Name: Marissa Jimenez Procedure Date: 05/28/2023 MRN: [id number] Attending MD: Napoleon Form , MD, [id number] Date of Birth: Feb 10, 1976 CSN: [id number] Age: 47 Admit Type: Outpatient Procedure:                Colonoscopy Indications:              Screening for colorectal malignant neoplasm Providers:                Napoleon Form, MD, Fransisca Connors, Adin Hector, RN, Faustina Mbumina, Technician, Jackqulyn Livings, CRNA Referring MD:              Medicines:                Monitored Anesthesia Care Complications:            No immediate complications. Estimated Blood Loss:     Estimated blood loss: none. Procedure:                Pre-Anesthesia Assessment:                           - Prior to the procedure, a History and Physical                            was performed, and patient medications and                            allergies were reviewed. The patient's tolerance of                            previous anesthesia was also reviewed. The risks                            and benefits of the procedure and the sedation                            options and risks were discussed with the patient.                            All questions were answered, and informed consent                            was obtained. Prior Anticoagulants: The patient has                            taken no anticoagulant or antiplatelet agents. ASA                            Grade Assessment: II - A patient with mild systemic  disease. After reviewing the risks and benefits,                            the patient was deemed in satisfactory condition to                            undergo the procedure.                           After obtaining informed consent, the colonoscope                            was passed under direct vision. Throughout the                             procedure, the patient's blood pressure, pulse, and                            oxygen saturations were monitored continuously. The                            PCF-HQ190L ([id number]) Olympus colonoscope was                            introduced through the anus and advanced to the the                            cecum, identified by the appendiceal orifice. The                            colonoscopy was performed without difficulty. The                            patient tolerated the procedure well. Scope In: 10:10:41 AM Scope Out: 10:28:05 AM Scope Withdrawal Time: 0 hours 9 minutes 52 seconds  Total Procedure Duration: 0 hours 17 minutes 24 seconds  Findings:      The perianal and digital rectal examinations were normal.      Scattered small-mouthed diverticula were found in the sigmoid colon,       ascending colon and cecum.      Non-bleeding external and internal hemorrhoids were found during       retroflexion. The hemorrhoids were small.      The exam was otherwise without abnormality. Impression:               - Diverticulosis in the sigmoid colon, in the                            ascending colon and in the cecum.                           - Non-bleeding external and internal hemorrhoids.                           - The examination was otherwise normal.                           -  No specimens collected. Moderate Sedation:      N/A Recommendation:           - Patient has a contact number available for                            emergencies. The signs and symptoms of potential                            delayed complications were discussed with the                            patient. Return to normal activities tomorrow.                            Written discharge instructions were provided to the                            patient.                           - Resume previous diet.                           - Continue present medications.                           - Repeat  colonoscopy in 10 years for surveillance. Procedure Code(s):        --- Professional ---                           N8295, Colorectal cancer screening; colonoscopy on                            individual not meeting criteria for high risk Diagnosis Code(s):        --- Professional ---                           Z12.11, Encounter for screening for malignant                            neoplasm of colon                           K64.8, Other hemorrhoids                           K57.30, Diverticulosis of large intestine without                            perforation or abscess without bleeding CPT copyright 2022 American Medical Association. All rights reserved. The codes documented in this report are preliminary and upon coder review may  be revised to meet current compliance requirements. Napoleon Form, MD 05/28/2023 10:36:14 AM This report has been signed electronically. Number of Addenda: 0

## 2023-05-28 NOTE — Anesthesia Preprocedure Evaluation (Addendum)
Anesthesia Evaluation  Patient identified by MRN, date of birth, ID band Patient awake    Reviewed: Allergy & Precautions, NPO status , Patient's Chart, lab work & pertinent test results, reviewed documented beta blocker date and time   Airway Mallampati: II  TM Distance: >3 FB Neck ROM: Full    Dental  (+) Dental Advisory Given, Teeth Intact   Pulmonary neg pulmonary ROS   Pulmonary exam normal breath sounds clear to auscultation       Cardiovascular hypertension, Pt. on medications and Pt. on home beta blockers Normal cardiovascular exam Rhythm:Regular Rate:Normal     Neuro/Psych  Headaches    GI/Hepatic Neg liver ROS,GERD  Medicated and Controlled,,  Endo/Other  diabetes, Type 2    Renal/GU negative Renal ROS     Musculoskeletal  (+) Arthritis ,    Abdominal  (+) + obese  Peds  Hematology negative hematology ROS (+)   Anesthesia Other Findings   Reproductive/Obstetrics                             Anesthesia Physical Anesthesia Plan  ASA: 2  Anesthesia Plan: MAC   Post-op Pain Management: Minimal or no pain anticipated   Induction: Intravenous  PONV Risk Score and Plan: Propofol infusion, TIVA, Treatment may vary due to age or medical condition and Ondansetron  Airway Management Planned:   Additional Equipment:   Intra-op Plan:   Post-operative Plan:   Informed Consent: I have reviewed the patients History and Physical, chart, labs and discussed the procedure including the risks, benefits and alternatives for the proposed anesthesia with the patient or authorized representative who has indicated his/her understanding and acceptance.     Dental advisory given  Plan Discussed with: CRNA  Anesthesia Plan Comments:         Anesthesia Quick Evaluation

## 2023-06-02 ENCOUNTER — Encounter (HOSPITAL_COMMUNITY): Payer: Self-pay | Admitting: Gastroenterology

## 2023-06-20 ENCOUNTER — Ambulatory Visit (INDEPENDENT_AMBULATORY_CARE_PROVIDER_SITE_OTHER): Payer: Self-pay

## 2023-06-20 ENCOUNTER — Other Ambulatory Visit: Payer: Self-pay

## 2023-06-20 ENCOUNTER — Ambulatory Visit (INDEPENDENT_AMBULATORY_CARE_PROVIDER_SITE_OTHER): Payer: Self-pay | Admitting: Podiatry

## 2023-06-20 ENCOUNTER — Encounter: Payer: Self-pay | Admitting: Podiatry

## 2023-06-20 DIAGNOSIS — M778 Other enthesopathies, not elsewhere classified: Secondary | ICD-10-CM

## 2023-06-20 DIAGNOSIS — M722 Plantar fascial fibromatosis: Secondary | ICD-10-CM

## 2023-06-20 MED ORDER — TRIAMCINOLONE ACETONIDE 10 MG/ML IJ SUSP
10.0000 mg | Freq: Once | INTRAMUSCULAR | Status: AC
Start: 2023-06-20 — End: 2023-06-20
  Administered 2023-06-20: 10 mg via INTRA_ARTICULAR

## 2023-06-20 MED ORDER — MELOXICAM 15 MG PO TABS
15.0000 mg | ORAL_TABLET | Freq: Every day | ORAL | 2 refills | Status: DC
  Filled 2023-06-20: qty 30, 30d supply, fill #0

## 2023-06-20 NOTE — Patient Instructions (Signed)

## 2023-06-20 NOTE — Progress Notes (Signed)
Subjective:   Patient ID: Markus Jarvis, female   DOB: 47 y.o.   MRN: [id number]   HPI Patient presents with significant heel pain left over right very painful when pressed been present for several months and makes walking difficult.  Has interpreter with her today.  Patient does not smoke likes to be active   Review of Systems  All other systems reviewed and are negative.       Objective:  Physical Exam Vitals and nursing note reviewed.  Constitutional:      Appearance: She is well-developed.  Pulmonary:     Effort: Pulmonary effort is normal.  Musculoskeletal:        General: Normal range of motion.  Skin:    General: Skin is warm.  Neurological:     Mental Status: She is alert.     Neurovascular status intact muscle strength found to be adequate range of motion within normal limits with patient found to have exquisite discomfort plantar aspect heel region bilateral with inflammation fluid around the medial band.  Patient is noted to have good digital perfusion well oriented x 3 moderate depression of the arch      Assessment:  Acute plantar fasciitis left over right with pain     Plan:  H&P reviewed condition and x-rays today did sterile prep injected the plantar fascia at insertion 3 mg Kenalog 5 mg Xylocaine and discussed possible fascial brace orthotics decision to be made based on response reappoint 2 weeks  X-rays indicate small spur no indication stress fracture arthritis

## 2023-06-22 ENCOUNTER — Other Ambulatory Visit: Payer: Self-pay

## 2023-07-02 ENCOUNTER — Ambulatory Visit: Payer: Self-pay | Attending: Nurse Practitioner | Admitting: Nurse Practitioner

## 2023-07-02 ENCOUNTER — Encounter: Payer: Self-pay | Admitting: Podiatry

## 2023-07-02 ENCOUNTER — Other Ambulatory Visit: Payer: Self-pay

## 2023-07-02 ENCOUNTER — Encounter: Payer: Self-pay | Admitting: Nurse Practitioner

## 2023-07-02 ENCOUNTER — Ambulatory Visit (INDEPENDENT_AMBULATORY_CARE_PROVIDER_SITE_OTHER): Payer: Self-pay | Admitting: Podiatry

## 2023-07-02 VITALS — BP 128/76 | HR 60 | Ht 64.0 in | Wt 195.4 lb

## 2023-07-02 VITALS — BP 134/80 | HR 69 | Temp 97.3°F | Resp 18 | Ht 64.0 in | Wt 195.0 lb

## 2023-07-02 DIAGNOSIS — E119 Type 2 diabetes mellitus without complications: Secondary | ICD-10-CM | POA: Insufficient documentation

## 2023-07-02 DIAGNOSIS — E1165 Type 2 diabetes mellitus with hyperglycemia: Secondary | ICD-10-CM

## 2023-07-02 DIAGNOSIS — Z7984 Long term (current) use of oral hypoglycemic drugs: Secondary | ICD-10-CM

## 2023-07-02 DIAGNOSIS — M722 Plantar fascial fibromatosis: Secondary | ICD-10-CM

## 2023-07-02 DIAGNOSIS — K2100 Gastro-esophageal reflux disease with esophagitis, without bleeding: Secondary | ICD-10-CM

## 2023-07-02 DIAGNOSIS — Z23 Encounter for immunization: Secondary | ICD-10-CM

## 2023-07-02 LAB — POCT GLYCOSYLATED HEMOGLOBIN (HGB A1C): Hemoglobin A1C: 8 % — AB (ref 4.0–5.6)

## 2023-07-02 MED ORDER — TRIAMCINOLONE ACETONIDE 10 MG/ML IJ SUSP
10.0000 mg | Freq: Once | INTRAMUSCULAR | Status: AC
Start: 2023-07-02 — End: 2023-07-02
  Administered 2023-07-02: 10 mg via INTRA_ARTICULAR

## 2023-07-02 MED ORDER — METFORMIN HCL 500 MG PO TABS
1000.0000 mg | ORAL_TABLET | Freq: Two times a day (BID) | ORAL | 1 refills | Status: DC
  Filled 2023-07-02: qty 180, 45d supply, fill #0

## 2023-07-02 MED ORDER — OMEPRAZOLE 20 MG PO CPDR
20.0000 mg | DELAYED_RELEASE_CAPSULE | Freq: Two times a day (BID) | ORAL | 1 refills | Status: DC
  Filled 2023-07-02: qty 180, 90d supply, fill #0

## 2023-07-02 NOTE — Assessment & Plan Note (Signed)
Patient has experienced throat pain and burning in stomach for 2-3 months. This correlates with when she stopped taking Omeprazole because she ran out. Plan to restart Omeprazole 20 mg PO twice daily between meals.

## 2023-07-02 NOTE — Progress Notes (Signed)
Interpreter ID # [phone]

## 2023-07-02 NOTE — Patient Instructions (Signed)
Increased Metformin to 1000 mg PO twice daily. Patient will notify clinic if she is not tolerating the medication.  Patient instructed to exercise 30-40 min 4-5 times weekly  Resume Omeprazole  Follow up in office in 3 months and prn

## 2023-07-02 NOTE — Progress Notes (Signed)
Assessment & Plan:  Marissa Jimenez was seen today for medical management of chronic issues.  Diagnoses and all orders for this visit:  Type 2 diabetes mellitus with hyperglycemia, without long-term current use of insulin  DOSE CHANGE (increase metformin) -     Urine Albumin/Creatinine with ratio (send out) [LAB689] -     POCT glycosylated hemoglobin (Hb A1C) -     CMP14+EGFR -     metFORMIN (GLUCOPHAGE) 500 MG tablet; Take 2 tablets (1,000 mg total) by mouth 2 (two) times daily with a meal. FOR DIABETES Continue blood sugar control as discussed in office today, low carbohydrate diet, and regular physical exercise as tolerated, 150 minutes per week (30 min each day, 5 days per week, or 50 min 3 days per week). Keep blood sugar logs with fasting goal of 90-130 mg/dl, post prandial (after you eat) less than 180.  For Hypoglycemia: BS <60 and Hyperglycemia BS >400; contact the clinic ASAP. Annual eye exams and foot exams are recommended.   Encounter for immunization -     Flu vaccine trivalent PF, 6mos and older(Flulaval,Afluria,Fluarix,Fluzone)  GERD Omeprazole resent to the pharmacy INSTRUCTIONS: Avoid GERD Triggers: acidic, spicy or fried foods, caffeine, coffee, sodas,  alcohol and chocolate.   Patient has been counseled on age-appropriate routine health concerns for screening and prevention.     These are reviewed and up-to-date. Referrals have been placed accordingly. Immunizations are up-to-date or declined.    Subjective:   Chief Complaint  Patient presents with   Medical Management of Chronic Issues   She has a past medical history of Arthritis (2012), Chiari malformation type I, DM 2, Headache (2013), and Subacromial bursitis (08/04/2013).    DM 2 Diabetes is poorly controlled. She is taking metformin 500 mg daily at this time. Not dietary adherent. She is not monitoring her blood glucose levels at home.  Lab Results  Component Value Date   HGBA1C 8.0 (A) 07/02/2023    Lab  Results  Component Value Date   LDLCALC 78 07/10/2022     GERD Patient is concerned about pain in throat and burning sensation after eating.  She says she eats food that is a little spicy. She denies consumption of foods that are greasy or fatty. This has been occurring for the past 2-3 months. However she has stopped taking her omeprazole and states symptoms started after she stopped taking it. Patient says she drank the water from boiled potatoes for her symptoms. However she got no relief.      Review of Systems  Constitutional: Negative.  Negative for fever, malaise/fatigue and weight loss.  HENT: Negative.  Negative for nosebleeds.   Eyes:  Positive for discharge. Negative for blurred vision, double vision and photophobia.       Itchy watery eyes  Respiratory: Negative.  Negative for cough and shortness of breath.   Cardiovascular: Negative.  Negative for chest pain, palpitations and leg swelling.  Gastrointestinal:  Positive for constipation and heartburn. Negative for nausea and vomiting.       Pain in throat and burning in stomach after eating  Genitourinary: Negative.   Musculoskeletal: Negative.  Negative for myalgias.  Neurological: Negative.  Negative for dizziness, focal weakness, seizures and headaches.  Endo/Heme/Allergies: Negative.   Psychiatric/Behavioral: Negative.  Negative for suicidal ideas.     Past Medical History:  Diagnosis Date   Arthritis 2012   Chiari malformation type I (HCC)    Diabetes mellitus without complication (HCC)    Headache 2013  Subacromial bursitis 08/04/2013    Past Surgical History:  Procedure Laterality Date   CESAREAN SECTION     two c-sections 2002, 2008   COLONOSCOPY WITH PROPOFOL N/A 05/28/2023   Procedure: COLONOSCOPY WITH PROPOFOL;  Surgeon: Napoleon Form, MD;  Location: [facility];  Service: Gastroenterology;  Laterality: N/A;   TUBAL LIGATION      Family History  Problem Relation Age of Onset   Hyperlipidemia  Mother    Arthritis Mother    Diabetes Father    Diabetes Sister    Alcohol abuse Brother    Cancer Neg Hx    Stomach cancer Neg Hx     Social History Reviewed with no changes to be made today.   Outpatient Medications Prior to Visit  Medication Sig Dispense Refill   lisinopril (ZESTRIL) 2.5 MG tablet Take 1 tablet (2.5 mg total) by mouth daily. To help protect kidneys 90 tablet 1   metoprolol succinate (TOPROL-XL) 25 MG 24 hr tablet Take 0.5 tablets (12.5 mg total) by mouth daily. 45 tablet 1   metFORMIN (GLUCOPHAGE) 500 MG tablet Take 1 tablet (500 mg total) by mouth daily with breakfast. FOR DIABETES 90 tablet 1   omeprazole (PRILOSEC) 20 MG capsule Take 1 capsule (20 mg total) by mouth 2 (two) times daily before a meal. 180 capsule 1   atorvastatin (LIPITOR) 20 MG tablet Take 1 tablet (20 mg total) by mouth daily. For cholesterol (Patient not taking: Reported on 07/02/2023) 90 tablet 1   Blood Glucose Monitoring Suppl (TRUE METRIX METER) w/Device KIT Use as instructed (Patient not taking: Reported on 07/02/2023) 1 kit 0   fluticasone (FLONASE) 50 MCG/ACT nasal spray Place 2 sprays into both nostrils daily. (Patient not taking: Reported on 07/02/2023) 16 g 6   glucose blood (TRUE METRIX BLOOD GLUCOSE TEST) test strip Check blood glucose levels once a day by fingerstick. (Patient not taking: Reported on 07/02/2023) 100 each 6   meloxicam (MOBIC) 15 MG tablet Take 1 tablet (15 mg total) by mouth daily. (Patient not taking: Reported on 07/02/2023) 30 tablet 2   TRUEplus Lancets 28G MISC Check blood glucose levels once a day by fingerstick (Patient not taking: Reported on 07/02/2023) 100 each 6   No facility-administered medications prior to visit.    No Known Allergies     Objective:    BP 128/76 (BP Location: Left Arm, Patient Position: Sitting, Cuff Size: Normal)   Pulse 60   Ht 5\' 4"  (1.626 m)   Wt 195 lb 6.4 oz (88.6 kg)   LMP  (LMP Unknown)   SpO2 99%   BMI 33.54 kg/m  Wt  Readings from Last 3 Encounters:  07/02/23 195 lb (88.5 kg)  07/02/23 195 lb 6.4 oz (88.6 kg)  05/28/23 180 lb (81.6 kg)    Physical Exam Constitutional:      Appearance: Normal appearance.  Cardiovascular:     Rate and Rhythm: Normal rate and regular rhythm.     Pulses: Normal pulses.     Heart sounds: Normal heart sounds.  Pulmonary:     Effort: Pulmonary effort is normal.     Breath sounds: Normal breath sounds.  Neurological:     General: No focal deficit present.     Mental Status: She is alert and oriented to person, place, and time.  Psychiatric:        Mood and Affect: Mood normal.        Behavior: Behavior normal.  Patient has been counseled extensively about nutrition and exercise as well as the importance of adherence with medications and regular follow-up. The patient was given clear instructions to go to ER or return to medical center if symptoms don't improve, worsen or new problems develop. The patient verbalized understanding.   Follow-up: Return in about 3 months (around 10/01/2023).   Claiborne Rigg, FNP-BC [facility] [place], [place] [phone]   07/02/2023, 1:30 PM

## 2023-07-02 NOTE — Progress Notes (Signed)
Subjective:   Patient ID: Marissa Jimenez, female   DOB: 47 y.o.   MRN: [id number]   HPI Patient presents stating feet are doing better the left heel is still fairly sore   ROS      Objective:  Physical Exam  Neurovascular status intact with the patient having inflammation pain of the plantar heel left fluid buildup right mildly tender not to the same degree     Assessment:  Plantar fasciitis left over right     Plan:  Sterile prep injected the fascia left 3 mg Kenalog 5 mg Xylocaine advised on support reappoint to recheck

## 2023-07-03 LAB — CMP14+EGFR
ALT: 29 IU/L (ref 0–32)
AST: 18 IU/L (ref 0–40)
Albumin: 4.4 g/dL (ref 3.9–4.9)
Alkaline Phosphatase: 66 IU/L (ref 44–121)
BUN/Creatinine Ratio: 23 (ref 9–23)
BUN: 12 mg/dL (ref 6–24)
Bilirubin Total: 0.5 mg/dL (ref 0.0–1.2)
CO2: 25 mmol/L (ref 20–29)
Calcium: 9.2 mg/dL (ref 8.7–10.2)
Chloride: 100 mmol/L (ref 96–106)
Creatinine, Ser: 0.52 mg/dL — ABNORMAL LOW (ref 0.57–1.00)
Globulin, Total: 2.7 g/dL (ref 1.5–4.5)
Glucose: 115 mg/dL — ABNORMAL HIGH (ref 70–99)
Potassium: 4.9 mmol/L (ref 3.5–5.2)
Sodium: 140 mmol/L (ref 134–144)
Total Protein: 7.1 g/dL (ref 6.0–8.5)
eGFR: 115 mL/min/{1.73_m2} (ref >59)

## 2023-07-03 LAB — MICROALBUMIN / CREATININE URINE RATIO
Creatinine, Urine: 172.5 mg/dL
Microalb/Creat Ratio: 5 mg/g creat (ref 0–29)
Microalbumin, Urine: 9 ug/mL

## 2023-10-15 ENCOUNTER — Other Ambulatory Visit: Payer: Self-pay

## 2023-10-15 ENCOUNTER — Encounter: Payer: Self-pay | Admitting: Nurse Practitioner

## 2023-10-15 ENCOUNTER — Ambulatory Visit: Payer: Self-pay | Attending: Nurse Practitioner | Admitting: Nurse Practitioner

## 2023-10-15 VITALS — BP 138/78 | HR 66 | Temp 97.7°F | Resp 19 | Ht 64.0 in | Wt 186.0 lb

## 2023-10-15 DIAGNOSIS — R6882 Decreased libido: Secondary | ICD-10-CM

## 2023-10-15 DIAGNOSIS — E1169 Type 2 diabetes mellitus with other specified complication: Secondary | ICD-10-CM

## 2023-10-15 DIAGNOSIS — E119 Type 2 diabetes mellitus without complications: Secondary | ICD-10-CM

## 2023-10-15 DIAGNOSIS — R002 Palpitations: Secondary | ICD-10-CM

## 2023-10-15 DIAGNOSIS — E1165 Type 2 diabetes mellitus with hyperglycemia: Secondary | ICD-10-CM

## 2023-10-15 DIAGNOSIS — K2100 Gastro-esophageal reflux disease with esophagitis, without bleeding: Secondary | ICD-10-CM

## 2023-10-15 DIAGNOSIS — Z7984 Long term (current) use of oral hypoglycemic drugs: Secondary | ICD-10-CM

## 2023-10-15 DIAGNOSIS — E785 Hyperlipidemia, unspecified: Secondary | ICD-10-CM

## 2023-10-15 LAB — POCT GLYCOSYLATED HEMOGLOBIN (HGB A1C): Hemoglobin A1C: 7.1 % — AB (ref 4.0–5.6)

## 2023-10-15 MED ORDER — LISINOPRIL 2.5 MG PO TABS
2.5000 mg | ORAL_TABLET | Freq: Every day | ORAL | 1 refills | Status: DC
  Filled 2023-10-15: qty 90, 90d supply, fill #0

## 2023-10-15 MED ORDER — METFORMIN HCL 500 MG PO TABS
1000.0000 mg | ORAL_TABLET | Freq: Two times a day (BID) | ORAL | 1 refills | Status: DC
  Filled 2023-10-15: qty 180, 45d supply, fill #0
  Filled 2024-04-14: qty 180, 45d supply, fill #1

## 2023-10-15 MED ORDER — OMEPRAZOLE 20 MG PO CPDR
20.0000 mg | DELAYED_RELEASE_CAPSULE | Freq: Two times a day (BID) | ORAL | 1 refills | Status: DC
  Filled 2023-10-15: qty 180, 90d supply, fill #0

## 2023-10-15 MED ORDER — METOPROLOL SUCCINATE ER 25 MG PO TB24
12.5000 mg | ORAL_TABLET | Freq: Every day | ORAL | 1 refills | Status: DC
  Filled 2023-10-15: qty 45, 90d supply, fill #0

## 2023-10-15 NOTE — Progress Notes (Signed)
 Assessment & Plan:  Marissa Jimenez was seen today for medical management of chronic issues.  Diagnoses and all orders for this visit:  Diabetes mellitus treated with oral medication (HCC) -     Lipid panel -     Basic metabolic panel -     POCT glycosylated hemoglobin (Hb A1C) Continue blood sugar control as discussed in office today, low carbohydrate diet, and regular physical exercise as tolerated, 150 minutes per week (30 min each day, 5 days per week, or 50 min 3 days per week). Keep blood sugar logs with fasting goal of 90-130 mg/dl, post prandial (after you eat) less than 180.  For Hypoglycemia: BS <60 and Hyperglycemia BS >400; contact the clinic ASAP. Annual eye exams and foot exams are recommended.   Hyperlipidemia associated with type 2 diabetes mellitus (HCC) -     Lipid panel INSTRUCTIONS: Work on a low fat, heart healthy diet and participate in regular aerobic exercise program by working out at least 150 minutes per week; 5 days a week-30 minutes per day. Avoid red meat/beef/steak,  fried foods. junk foods, sodas, sugary drinks, unhealthy snacking, alcohol and smoking.  Drink at least 80 oz of water per day and monitor your carbohydrate intake daily.    Decreased sex drive Try maca root, ginko biloba or ashwaghanda -     Ambulatory referral to Gynecology  Type 2 diabetes mellitus with hyperglycemia, without long-term current use of insulin (HCC) -     lisinopril  (ZESTRIL ) 2.5 MG tablet; Take 1 tablet (2.5 mg total) by mouth daily. To help protect kidneys -     metFORMIN  (GLUCOPHAGE ) 500 MG tablet; Take 2 tablets (1,000 mg total) by mouth 2 (two) times daily with a meal. FOR DIABETES Continue blood sugar control as discussed in office today, low carbohydrate diet, and regular physical exercise as tolerated, 150 minutes per week (30 min each day, 5 days per week, or 50 min 3 days per week). Keep blood sugar logs with fasting goal of 90-130 mg/dl, post prandial (after you eat) less than  180.  For Hypoglycemia: BS <60 and Hyperglycemia BS >400; contact the clinic ASAP. Annual eye exams and foot exams are recommended.   Heart palpitations -     metoprolol  succinate (TOPROL -XL) 25 MG 24 hr tablet; Take 0.5 tablets (12.5 mg total) by mouth daily.  Gastroesophageal reflux disease with esophagitis without hemorrhage -     omeprazole  (PRILOSEC) 20 MG capsule; Take 1 capsule (20 mg total) by mouth 2 (two) times daily before a meal. INSTRUCTIONS: Avoid GERD Triggers: acidic, spicy or fried foods, caffeine , coffee, sodas,  alcohol and chocolate.      Patient has been counseled on age-appropriate routine health concerns for screening and prevention. These are reviewed and up-to-date. Referrals have been placed accordingly. Immunizations are up-to-date or declined.    Subjective:   Chief Complaint  Patient presents with   Medical Management of Chronic Issues    Marissa Jimenez 48 y.o. female presents to office today for follow up to DM  VRI was used to communicate directly with patient for the entire encounter including providing detailed patient instructions.    DM 2 Improved and near goal. She is taking metformin  1000 mg BID.  She has been walking and weight is down almost 10lb since last visit 3 months ago.  Lab Results  Component Value Date   HGBA1C 7.1 (A) 10/15/2023    Lab Results  Component Value Date   HGBA1C 8.0 (A) 07/02/2023  BP Readings from Last 3 Encounters:  10/15/23 138/78  07/02/23 134/80  07/02/23 128/76   She has been experiencing low sex drive. Sometimes does not want her husband to touch her. This has been going on for over a year.   Review of Systems  Constitutional:  Negative for fever, malaise/fatigue and weight loss.  HENT: Negative.  Negative for nosebleeds.   Eyes: Negative.  Negative for blurred vision, double vision and photophobia.  Respiratory: Negative.  Negative for cough and shortness of breath.   Cardiovascular: Negative.   Negative for chest pain, palpitations and leg swelling.  Gastrointestinal: Negative.  Negative for heartburn, nausea and vomiting.  Musculoskeletal: Negative.  Negative for myalgias.  Neurological: Negative.  Negative for dizziness, focal weakness, seizures and headaches.  Psychiatric/Behavioral: Negative.  Negative for suicidal ideas.     Past Medical History:  Diagnosis Date   Arthritis 2012   Chiari malformation type I (HCC)    Diabetes mellitus without complication (HCC)    Headache 2013   Subacromial bursitis 08/04/2013    Past Surgical History:  Procedure Laterality Date   CESAREAN SECTION     two c-sections 2002, 2008   COLONOSCOPY WITH PROPOFOL  N/A 05/28/2023   Procedure: COLONOSCOPY WITH PROPOFOL ;  Surgeon: Shila Gustav GAILS, MD;  Location: [facility];  Service: Gastroenterology;  Laterality: N/A;   TUBAL LIGATION      Family History  Problem Relation Age of Onset   Hyperlipidemia Mother    Arthritis Mother    Diabetes Father    Diabetes Sister    Alcohol abuse Brother    Cancer Neg Hx    Stomach cancer Neg Hx     Social History Reviewed with no changes to be made today.   Outpatient Medications Prior to Visit  Medication Sig Dispense Refill   lisinopril  (ZESTRIL ) 2.5 MG tablet Take 1 tablet (2.5 mg total) by mouth daily. To help protect kidneys 90 tablet 1   metFORMIN  (GLUCOPHAGE ) 500 MG tablet Take 2 tablets (1,000 mg total) by mouth 2 (two) times daily with a meal. FOR DIABETES 180 tablet 1   metoprolol  succinate (TOPROL -XL) 25 MG 24 hr tablet Take 0.5 tablets (12.5 mg total) by mouth daily. 45 tablet 1   omeprazole  (PRILOSEC) 20 MG capsule Take 1 capsule (20 mg total) by mouth 2 (two) times daily before a meal. 180 capsule 1   No facility-administered medications prior to visit.    No Known Allergies     Objective:    BP 138/78 (BP Location: Left Arm, Patient Position: Sitting, Cuff Size: Normal)   Pulse 66   Temp 97.7 F (36.5 C) (Oral)   Resp  19   Ht 5' 4 (1.626 m)   Wt 186 lb (84.4 kg)   SpO2 100%   BMI 31.93 kg/m  Wt Readings from Last 3 Encounters:  10/15/23 186 lb (84.4 kg)  07/02/23 195 lb (88.5 kg)  07/02/23 195 lb 6.4 oz (88.6 kg)    Physical Exam Vitals and nursing note reviewed.  Constitutional:      Appearance: She is well-developed.  HENT:     Head: Normocephalic and atraumatic.  Cardiovascular:     Rate and Rhythm: Normal rate and regular rhythm.     Heart sounds: Normal heart sounds. No murmur heard.    No friction rub. No gallop.  Pulmonary:     Effort: Pulmonary effort is normal. No tachypnea or respiratory distress.     Breath sounds: Normal breath sounds. No decreased  breath sounds, wheezing, rhonchi or rales.  Chest:     Chest wall: No tenderness.  Abdominal:     General: Bowel sounds are normal.     Palpations: Abdomen is soft.  Musculoskeletal:        General: Normal range of motion.     Cervical back: Normal range of motion.  Skin:    General: Skin is warm and dry.  Neurological:     Mental Status: She is alert and oriented to person, place, and time.     Coordination: Coordination normal.  Psychiatric:        Behavior: Behavior normal. Behavior is cooperative.        Thought Content: Thought content normal.        Judgment: Judgment normal.          Patient has been counseled extensively about nutrition and exercise as well as the importance of adherence with medications and regular follow-up. The patient was given clear instructions to go to ER or return to medical center if symptoms don't improve, worsen or new problems develop. The patient verbalized understanding.   Follow-up: Return in about 3 months (around 01/13/2024).   Haze LELON Servant, FNP-BC [facility] [place], [place] [phone]   10/15/2023, 12:06 PM

## 2023-10-15 NOTE — Patient Instructions (Addendum)
 To help decreased sex drive: para ayudar con la disminucin del deseo sexual  MACA ROOT  Ginko Biloba  Ashwaghanda  LOCATIONS [facility]  [facility] WHO[employer]    EXERCISE VIDEO Walk away the pounds

## 2023-10-16 LAB — BASIC METABOLIC PANEL
BUN/Creatinine Ratio: 19 (ref 9–23)
BUN: 12 mg/dL (ref 6–24)
CO2: 24 mmol/L (ref 20–29)
Calcium: 9.6 mg/dL (ref 8.7–10.2)
Chloride: 102 mmol/L (ref 96–106)
Creatinine, Ser: 0.63 mg/dL (ref 0.57–1.00)
Glucose: 135 mg/dL — ABNORMAL HIGH (ref 70–99)
Potassium: 4.7 mmol/L (ref 3.5–5.2)
Sodium: 141 mmol/L (ref 134–144)
eGFR: 110 mL/min/{1.73_m2} (ref >59)

## 2023-10-16 LAB — LIPID PANEL
Chol/HDL Ratio: 3.1 {ratio} (ref 0.0–4.4)
Cholesterol, Total: 196 mg/dL (ref 100–199)
HDL: 63 mg/dL (ref >39)
LDL Chol Calc (NIH): 112 mg/dL — ABNORMAL HIGH (ref 0–99)
Triglycerides: 122 mg/dL (ref 0–149)
VLDL Cholesterol Cal: 21 mg/dL (ref 5–40)

## 2023-10-24 ENCOUNTER — Other Ambulatory Visit: Payer: Self-pay

## 2023-10-25 ENCOUNTER — Other Ambulatory Visit: Payer: Self-pay

## 2024-01-14 ENCOUNTER — Other Ambulatory Visit: Payer: Self-pay

## 2024-01-14 ENCOUNTER — Encounter: Payer: Self-pay | Admitting: Nurse Practitioner

## 2024-01-14 ENCOUNTER — Ambulatory Visit: Payer: Self-pay | Attending: Nurse Practitioner | Admitting: Nurse Practitioner

## 2024-01-14 VITALS — BP 115/75 | HR 63 | Resp 19 | Ht 64.0 in | Wt 181.6 lb

## 2024-01-14 DIAGNOSIS — Z23 Encounter for immunization: Secondary | ICD-10-CM

## 2024-01-14 DIAGNOSIS — E1165 Type 2 diabetes mellitus with hyperglycemia: Secondary | ICD-10-CM

## 2024-01-14 DIAGNOSIS — K2100 Gastro-esophageal reflux disease with esophagitis, without bleeding: Secondary | ICD-10-CM

## 2024-01-14 LAB — POCT GLYCOSYLATED HEMOGLOBIN (HGB A1C): Hemoglobin A1C: 7.8 % — AB (ref 4.0–5.6)

## 2024-01-14 MED ORDER — OMEPRAZOLE 20 MG PO CPDR
20.0000 mg | DELAYED_RELEASE_CAPSULE | Freq: Two times a day (BID) | ORAL | 1 refills | Status: DC
  Filled 2024-01-14: qty 180, 90d supply, fill #0

## 2024-01-14 NOTE — Progress Notes (Signed)
 Assessment & Plan:  Marissa Jimenez was seen today for medical management of chronic issues.  Diagnoses and all orders for this visit:  Type 2 diabetes mellitus with hyperglycemia, without long-term current use of insulin (HCC) -     POCT glycosylated hemoglobin (Hb A1C) -     CMP14+EGFR we should be okayd Continue blood sugar control as discussed in office today, low carbohydrate diet, and regular physical exercise as tolerated, 150 minutes per week (30 min each day, 5 days per week, or 50 min 3 days per week). Keep blood sugar logs with fasting goal of 90-130 mg/dl, post prandial (after you eat) less than 180.  For Hypoglycemia: BS <60 and Hyperglycemia BS >400; contact the clinic ASAP. Annual eye exams and foot exams are recommended.   Need for pneumococcal 20-valent conjugate vaccination -     Pneumococcal conjugate vaccine 20-valent  Gastroesophageal reflux disease with esophagitis without hemorrhage -     omeprazole (PRILOSEC) 20 MG capsule; Take 1 capsule (20 mg total) by mouth 2 (two) times daily before a meal. FOR acid reflux INSTRUCTIONS: Avoid GERD Triggers: acidic, spicy or fried foods, caffeine, coffee, sodas,  alcohol and chocolate.      Patient has been counseled on age-appropriate routine health concerns for screening and prevention. These are reviewed and up-to-date. Referrals have been placed accordingly. Immunizations are up-to-date or declined.    Subjective:   Chief Complaint  Patient presents with   Medical Management of Chronic Issues    Marissa Jimenez 48 y.o. female presents to office today or follow up to DM and HTN   VRI was used to communicate directly with patient for the entire encounter including providing detailed patient instructions.     DM 2 A1c not at goal of less than 6.5. She is taking metformin 1000 mg BID.  She has been walking and weight is down almost 10lb since last visit 3 months ago. She declines ozempic, victoza  or trulicity today.   Weight is gradually trending down.  She is on a renal dose ACE Lab Results  Component Value Date   HGBA1C 7.8 (A) 01/14/2024    Lab Results  Component Value Date   HGBA1C 7.1 (A) 10/15/2023      HTN Blood pressure is well controlled today.  She never picked up her Toprol-XL. BP Readings from Last 3 Encounters:  01/14/24 115/75  10/15/23 138/78  07/02/23 134/80     Review of Systems  Constitutional:  Negative for fever, malaise/fatigue and weight loss.  HENT: Negative.  Negative for nosebleeds.   Eyes: Negative.  Negative for blurred vision, double vision and photophobia.  Respiratory: Negative.  Negative for cough and shortness of breath.   Cardiovascular: Negative.  Negative for chest pain, palpitations and leg swelling.  Gastrointestinal: Negative.  Negative for heartburn, nausea and vomiting.  Musculoskeletal: Negative.  Negative for myalgias.  Neurological: Negative.  Negative for dizziness, focal weakness, seizures and headaches.  Psychiatric/Behavioral: Negative.  Negative for suicidal ideas.     Past Medical History:  Diagnosis Date   Arthritis 2012   Chiari malformation type I (HCC)    Diabetes mellitus without complication (HCC)    Headache 2013   Subacromial bursitis 08/04/2013    Past Surgical History:  Procedure Laterality Date   CESAREAN SECTION     two c-sections 2002, 2008   COLONOSCOPY WITH PROPOFOL N/A 05/28/2023   Procedure: COLONOSCOPY WITH PROPOFOL;  Surgeon: Napoleon Form, MD;  Location: [facility];  Service: Gastroenterology;  Laterality: N/A;   TUBAL LIGATION      Family History  Problem Relation Age of Onset   Hyperlipidemia Mother    Arthritis Mother    Diabetes Father    Diabetes Sister    Alcohol abuse Brother    Cancer Neg Hx    Stomach cancer Neg Hx     Social History Reviewed with no changes to be made today.   Outpatient Medications Prior to Visit  Medication Sig Dispense Refill   lisinopril (ZESTRIL) 2.5 MG tablet  Take 1 tablet (2.5 mg total) by mouth daily. To help protect kidneys 90 tablet 1   metFORMIN (GLUCOPHAGE) 500 MG tablet Take 2 tablets (1,000 mg total) by mouth 2 (two) times daily with a meal. FOR DIABETES 180 tablet 1   metoprolol succinate (TOPROL-XL) 25 MG 24 hr tablet Take 0.5 tablets (12.5 mg total) by mouth daily. 45 tablet 1   omeprazole (PRILOSEC) 20 MG capsule Take 1 capsule (20 mg total) by mouth 2 (two) times daily before a meal. 180 capsule 1   No facility-administered medications prior to visit.    No Known Allergies     Objective:    BP 115/75 (BP Location: Left Arm, Patient Position: Sitting, Cuff Size: Normal)   Pulse 63   Resp 19   Ht 5\' 4"  (1.626 m)   Wt 181 lb 9.6 oz (82.4 kg)   SpO2 99%   BMI 31.17 kg/m  Wt Readings from Last 3 Encounters:  01/14/24 181 lb 9.6 oz (82.4 kg)  10/15/23 186 lb (84.4 kg)  07/02/23 195 lb (88.5 kg)    Physical Exam Vitals and nursing note reviewed.  Constitutional:      Appearance: She is well-developed.  HENT:     Head: Normocephalic and atraumatic.  Cardiovascular:     Rate and Rhythm: Normal rate and regular rhythm.     Heart sounds: Normal heart sounds. No murmur heard.    No friction rub. No gallop.  Pulmonary:     Effort: Pulmonary effort is normal. No tachypnea or respiratory distress.     Breath sounds: Normal breath sounds. No decreased breath sounds, wheezing, rhonchi or rales.  Chest:     Chest wall: No tenderness.  Abdominal:     General: Bowel sounds are normal.     Palpations: Abdomen is soft.  Musculoskeletal:        General: Normal range of motion.     Cervical back: Normal range of motion.  Skin:    General: Skin is warm and dry.  Neurological:     Mental Status: She is alert and oriented to person, place, and time.     Coordination: Coordination normal.  Psychiatric:        Behavior: Behavior normal. Behavior is cooperative.        Thought Content: Thought content normal.        Judgment:  Judgment normal.          Patient has been counseled extensively about nutrition and exercise as well as the importance of adherence with medications and regular follow-up. The patient was given clear instructions to go to ER or return to medical center if symptoms don't improve, worsen or new problems develop. The patient verbalized understanding.   Follow-up: Return in about 3 months (around 04/14/2024).   Claiborne Rigg, FNP-BC [facility] [place], [place] [phone]   01/14/2024, 8:57 AM

## 2024-01-14 NOTE — Patient Instructions (Signed)
 YOUTUBE VIDEOS  WALK AWAY THE POUNDS

## 2024-01-15 LAB — CMP14+EGFR
ALT: 37 IU/L — ABNORMAL HIGH (ref 0–32)
AST: 22 IU/L (ref 0–40)
Albumin: 4.7 g/dL (ref 3.9–4.9)
Alkaline Phosphatase: 72 IU/L (ref 44–121)
BUN/Creatinine Ratio: 21 (ref 9–23)
BUN: 12 mg/dL (ref 6–24)
Bilirubin Total: 0.6 mg/dL (ref 0.0–1.2)
CO2: 23 mmol/L (ref 20–29)
Calcium: 10 mg/dL (ref 8.7–10.2)
Chloride: 99 mmol/L (ref 96–106)
Creatinine, Ser: 0.58 mg/dL (ref 0.57–1.00)
Globulin, Total: 2.7 g/dL (ref 1.5–4.5)
Glucose: 192 mg/dL — ABNORMAL HIGH (ref 70–99)
Potassium: 5.1 mmol/L (ref 3.5–5.2)
Sodium: 138 mmol/L (ref 134–144)
Total Protein: 7.4 g/dL (ref 6.0–8.5)
eGFR: 112 mL/min/{1.73_m2} (ref >59)

## 2024-01-16 ENCOUNTER — Ambulatory Visit: Payer: Self-pay

## 2024-01-16 NOTE — Telephone Encounter (Signed)
 Noted.

## 2024-01-16 NOTE — Telephone Encounter (Signed)
 Pt calling in via Spanish Interpreter for lab results. Pt advised of results, questions answered per this RN. Pt verbalized understanding and agrees to plan.   Copied from CRM [id number]. Topic: Clinical - Lab/Test Results >> Jan 16, 2024  1:30 PM Geroge Baseman wrote: Reason for CRM: patient wanting to go over lab results values Reason for Disposition  Caller requesting routine or non-urgent lab result  Health Information question, no triage required and triager able to answer question  Answer Assessment - Initial Assessment Questions 1. REASON FOR CALL or QUESTION: "What is your reason for calling today?" or "How can I best help you?" or "What question do you have that I can help answer?"     Results 2. CALLER: Document the source of call. (e.g., laboratory, patient).     Patient  Answer Assessment - Initial Assessment Questions 1. REASON FOR CALL or QUESTION: "What is your reason for calling today?" or "How can I best help you?" or "What question do you have that I can help answer?"     Results  Protocols used: PCP Call - No Triage-A-AH, Information Only Call - No Triage-A-AH

## 2024-01-23 ENCOUNTER — Other Ambulatory Visit: Payer: Self-pay

## 2024-04-14 ENCOUNTER — Other Ambulatory Visit: Payer: Self-pay

## 2024-04-14 ENCOUNTER — Encounter: Payer: Self-pay | Admitting: Nurse Practitioner

## 2024-04-14 ENCOUNTER — Ambulatory Visit: Payer: Self-pay | Attending: Nurse Practitioner | Admitting: Nurse Practitioner

## 2024-04-14 VITALS — BP 120/75 | HR 62 | Resp 19 | Ht 64.0 in | Wt 180.0 lb

## 2024-04-14 DIAGNOSIS — E119 Type 2 diabetes mellitus without complications: Secondary | ICD-10-CM

## 2024-04-14 DIAGNOSIS — Z23 Encounter for immunization: Secondary | ICD-10-CM

## 2024-04-14 DIAGNOSIS — Z7984 Long term (current) use of oral hypoglycemic drugs: Secondary | ICD-10-CM

## 2024-04-14 DIAGNOSIS — E1165 Type 2 diabetes mellitus with hyperglycemia: Secondary | ICD-10-CM

## 2024-04-14 DIAGNOSIS — Z1231 Encounter for screening mammogram for malignant neoplasm of breast: Secondary | ICD-10-CM

## 2024-04-14 LAB — POCT GLYCOSYLATED HEMOGLOBIN (HGB A1C): Hemoglobin A1C: 8.3 % — AB (ref 4.0–5.6)

## 2024-04-14 MED ORDER — LISINOPRIL 2.5 MG PO TABS
2.5000 mg | ORAL_TABLET | Freq: Every day | ORAL | 1 refills | Status: AC
  Filled 2024-04-14 (×2): qty 90, 90d supply, fill #0

## 2024-04-14 MED ORDER — EMPAGLIFLOZIN 10 MG PO TABS
10.0000 mg | ORAL_TABLET | Freq: Every day | ORAL | 6 refills | Status: AC
  Filled 2024-04-14: qty 30, 30d supply, fill #0
  Filled 2024-05-05: qty 30, 30d supply, fill #1
  Filled 2024-08-12: qty 17, 17d supply, fill #1

## 2024-04-14 NOTE — Addendum Note (Signed)
 Addended by: LIANE JACKOLYN GAILS on: 04/14/2024 10:52 AM   Modules accepted: Orders

## 2024-04-14 NOTE — Progress Notes (Signed)
 Assessment & Plan:  Marissa Jimenez was seen today for diabetes.  Diagnoses and all orders for this visit:  Diabetes mellitus treated with oral medication  Continue metformin  as prescribed.  Added Jardiance  today. -     POCT glycosylated hemoglobin (Hb A1C) -     CMP14+EGFR -     empagliflozin  (JARDIANCE ) 10 MG TABS tablet; Take 1 tablet (10 mg total) by mouth daily before breakfast. NEEDS PASS  Mammogram Filled mammogram scholarship form today. Referral placed to breast clinic.    Patient has been counseled on age-appropriate routine health concerns for screening and prevention. These are reviewed and up-to-date. Referrals have been placed accordingly. Immunizations are up-to-date or declined.    Subjective:   Chief Complaint  Patient presents with   Diabetes   Marissa Jimenez 48 y.o. female presents to office today for f/u to DM  VRI was used to communicate directly with patient for the entire encounter including providing detailed patient instructions.     DM 2 A1c not at goal of less than 6.5. Has actually increased from 7.8 to 8.3. She is taking metformin  1000 mg BID.  She has been walking and weight is down almost 10lb since last visit 3 months ago. She declined ozempic, victoza  or trulicity at her last visit and continues to decline.   Weight is gradually trending down but A1c is not .  She is prescribed renal dose ACE and blood pressure is well controlled.  Lab Results  Component Value Date   HGBA1C 8.3 (A) 04/14/2024    Lab Results  Component Value Date   HGBA1C 7.8 (A) 01/14/2024     Blood pressure is well controlled  BP Readings from Last 3 Encounters:  04/14/24 120/75  01/14/24 115/75  10/15/23 138/78    Review of Systems  Constitutional:  Negative for fever, malaise/fatigue and weight loss.  HENT: Negative.  Negative for nosebleeds.   Eyes: Negative.  Negative for blurred vision, double vision and photophobia.  Respiratory: Negative.  Negative for cough  and shortness of breath.   Cardiovascular: Negative.  Negative for chest pain, palpitations and leg swelling.  Gastrointestinal: Negative.  Negative for heartburn, nausea and vomiting.  Musculoskeletal: Negative.  Negative for myalgias.  Neurological: Negative.  Negative for dizziness, focal weakness, seizures and headaches.  Psychiatric/Behavioral: Negative.  Negative for suicidal ideas.     Past Medical History:  Diagnosis Date   Arthritis 2012   Chiari malformation type I (HCC)    Diabetes mellitus without complication (HCC)    Headache 2013   Subacromial bursitis 08/04/2013    Past Surgical History:  Procedure Laterality Date   CESAREAN SECTION     two c-sections 2002, 2008   COLONOSCOPY WITH PROPOFOL  N/A 05/28/2023   Procedure: COLONOSCOPY WITH PROPOFOL ;  Surgeon: Shila Gustav GAILS, MD;  Location: [facility];  Service: Gastroenterology;  Laterality: N/A;   TUBAL LIGATION      Family History  Problem Relation Age of Onset   Hyperlipidemia Mother    Arthritis Mother    Diabetes Father    Diabetes Sister    Alcohol abuse Brother    Cancer Neg Hx    Stomach cancer Neg Hx     Social History Reviewed with no changes to be made today.   Outpatient Medications Prior to Visit  Medication Sig Dispense Refill   metFORMIN  (GLUCOPHAGE ) 500 MG tablet Take 2 tablets (1,000 mg total) by mouth 2 (two) times daily with a meal. FOR DIABETES 180 tablet  1   lisinopril  (ZESTRIL ) 2.5 MG tablet Take 1 tablet (2.5 mg total) by mouth daily. To help protect kidneys 90 tablet 1   metoprolol  succinate (TOPROL -XL) 25 MG 24 hr tablet Take 0.5 tablets (12.5 mg total) by mouth daily. 45 tablet 1   omeprazole  (PRILOSEC) 20 MG capsule Take 1 capsule (20 mg total) by mouth 2 (two) times daily before a meal. FOR acid reflux 180 capsule 1   No facility-administered medications prior to visit.    No Known Allergies     Objective:    BP 120/75 (BP Location: Left Arm, Patient Position: Sitting,  Cuff Size: Normal)   Pulse 62   Resp 19   Ht 5' 4 (1.626 m)   Wt 180 lb (81.6 kg)   SpO2 99%   BMI 30.90 kg/m  Wt Readings from Last 3 Encounters:  04/14/24 180 lb (81.6 kg)  01/14/24 181 lb 9.6 oz (82.4 kg)  10/15/23 186 lb (84.4 kg)    Physical Exam Vitals and nursing note reviewed.  Constitutional:      Appearance: She is well-developed.  HENT:     Head: Normocephalic and atraumatic.  Cardiovascular:     Rate and Rhythm: Normal rate and regular rhythm.     Heart sounds: Normal heart sounds. No murmur heard.    No friction rub. No gallop.  Pulmonary:     Effort: Pulmonary effort is normal. No tachypnea or respiratory distress.     Breath sounds: Normal breath sounds. No decreased breath sounds, wheezing, rhonchi or rales.  Chest:     Chest wall: No tenderness.  Abdominal:     General: Bowel sounds are normal.     Palpations: Abdomen is soft.  Musculoskeletal:        General: Normal range of motion.     Cervical back: Normal range of motion.  Skin:    General: Skin is warm and dry.  Neurological:     Mental Status: She is alert and oriented to person, place, and time.     Coordination: Coordination normal.  Psychiatric:        Behavior: Behavior normal. Behavior is cooperative.        Thought Content: Thought content normal.        Judgment: Judgment normal.          Patient has been counseled extensively about nutrition and exercise as well as the importance of adherence with medications and regular follow-up. The patient was given clear instructions to go to ER or return to medical center if symptoms don't improve, worsen or new problems develop. The patient verbalized understanding.   Follow-up: Return in about 3 months (around 07/18/2024).   Haze LELON Servant, FNP-BC [facility] [place], [place] [phone]   04/14/2024, 9:34 AM

## 2024-04-15 ENCOUNTER — Ambulatory Visit: Payer: Self-pay | Admitting: Nurse Practitioner

## 2024-04-15 LAB — CMP14+EGFR
ALT: 34 IU/L — ABNORMAL HIGH (ref 0–32)
AST: 16 IU/L (ref 0–40)
Albumin: 4.5 g/dL (ref 3.9–4.9)
Alkaline Phosphatase: 67 IU/L (ref 44–121)
BUN/Creatinine Ratio: 19 (ref 9–23)
BUN: 11 mg/dL (ref 6–24)
Bilirubin Total: 0.6 mg/dL (ref 0.0–1.2)
CO2: 22 mmol/L (ref 20–29)
Calcium: 9.6 mg/dL (ref 8.7–10.2)
Chloride: 99 mmol/L (ref 96–106)
Creatinine, Ser: 0.59 mg/dL (ref 0.57–1.00)
Globulin, Total: 2.8 g/dL (ref 1.5–4.5)
Glucose: 221 mg/dL — ABNORMAL HIGH (ref 70–99)
Potassium: 4.7 mmol/L (ref 3.5–5.2)
Sodium: 137 mmol/L (ref 134–144)
Total Protein: 7.3 g/dL (ref 6.0–8.5)
eGFR: 111 mL/min/1.73 (ref >59)

## 2024-04-16 ENCOUNTER — Other Ambulatory Visit: Payer: Self-pay

## 2024-04-17 ENCOUNTER — Telehealth (INDEPENDENT_AMBULATORY_CARE_PROVIDER_SITE_OTHER): Payer: Self-pay

## 2024-04-17 NOTE — Telephone Encounter (Signed)
 Contacted pt to schedule mammogram  Pt is schedule for 05/16/24 at 3:40pm   [employer] interpreter: comer  ID: [phone]

## 2024-04-25 ENCOUNTER — Telehealth: Payer: Self-pay

## 2024-04-25 ENCOUNTER — Other Ambulatory Visit: Payer: Self-pay

## 2024-04-25 NOTE — Telephone Encounter (Signed)
 Submitted application for [employer] to [employer]) for patient assistance.   Phone: [phone]

## 2024-04-28 ENCOUNTER — Other Ambulatory Visit: Payer: Self-pay

## 2024-04-30 ENCOUNTER — Telehealth: Payer: Self-pay

## 2024-04-30 ENCOUNTER — Other Ambulatory Visit: Payer: Self-pay

## 2024-04-30 NOTE — Telephone Encounter (Signed)
 Received notification from [facility] [employer]) regarding approval for JARDIANCE . Patient assistance approved from 04/29/2024 to 04/29/2025.  Medication will ship to [address] [address]  Pt ID: [id number]  Company phone: [phone]

## 2024-05-02 ENCOUNTER — Ambulatory Visit: Payer: Self-pay | Admitting: Nurse Practitioner

## 2024-05-02 NOTE — Telephone Encounter (Signed)
Noted.  Agree with disposition.

## 2024-05-02 NOTE — Telephone Encounter (Signed)
 FYI Only or Action Required?: Action required by provider: update on patient condition.  Patient was last seen in primary care on 04/14/2024 by Theotis Haze ORN, NP.  Called Nurse Triage reporting Chest Pain.  Symptoms began a week ago.  Symptoms are: unchanged.  Triage Disposition: Go to ED Now (Notify PCP)  Patient/caregiver understands and will follow disposition?: No, refuses disposition                      Copied from CRM [id number]. Topic: Clinical - Red Word Triage >> May 02, 2024  2:53 PM Antwanette L wrote: Red Word that prompted transfer to Nurse Triage: Patient is experiencing some swelling on the left side of her chest. Pt said it hurts whenever she touches it. Also pt said she is having sob some times.   Erla [phone]) [employer] Reason for Disposition  [1] Chest pain (or angina) comes and goes AND [2] is happening more often (increasing in frequency) or getting worse (increasing in severity)  (Exception: Chest pains that last only a few seconds.)  Answer Assessment - Initial Assessment Questions This RN spoke with the pt using [employer], [place] #[id number]). This RN recommends ED but pt refused and wants to schedule an appointment at office. Pt call back number is [phone]. This RN notified [facility] of pt refusal of ED disposition.     LOCATION: Where does it hurt?       Inflammation on left side of chest some days; hurts a little with touch RADIATION: Does the pain go anywhere else? (e.g., into neck, jaw, arms, back)     A little bit of pain in back ONSET: When did the chest pain begin? (Minutes, hours or days)      8 days PATTERN: Does the pain come and go, or has it been constant since it started?  Does it get worse with exertion?      Only pain with touch SEVERITY: How bad is the pain?  (e.g., Scale 1-10; mild, moderate, or severe)     6-7/10 pain with touch CARDIAC RISK FACTORS: Do you have any history  of heart problems or risk factors for heart disease? (e.g., angina, prior heart attack; diabetes, high blood pressure, high cholesterol, smoker, or strong family history of heart disease)     A little bit of high cholesterol and high sugar PULMONARY RISK FACTORS: Do you have any history of lung disease?  (e.g., blood clots in lung, asthma, emphysema, birth control pills)     No CAUSE: What do you think is causing the chest pain?     No OTHER SYMPTOMS: Do you have any other symptoms? (e.g., dizziness, nausea, vomiting, sweating, fever, difficulty breathing, cough)       None  Protocols used: Chest Pain-A-AH

## 2024-05-05 ENCOUNTER — Other Ambulatory Visit: Payer: Self-pay

## 2024-05-08 ENCOUNTER — Other Ambulatory Visit: Payer: Self-pay

## 2024-05-16 ENCOUNTER — Ambulatory Visit
Admission: RE | Admit: 2024-05-16 | Discharge: 2024-05-16 | Disposition: A | Discharge status: Home / Self Care | Payer: Self-pay | Source: Ambulatory Visit

## 2024-05-16 DIAGNOSIS — Z1231 Encounter for screening mammogram for malignant neoplasm of breast: Secondary | ICD-10-CM

## 2024-05-19 ENCOUNTER — Other Ambulatory Visit: Payer: Self-pay

## 2024-06-10 ENCOUNTER — Other Ambulatory Visit: Payer: Self-pay

## 2024-06-18 ENCOUNTER — Other Ambulatory Visit: Payer: Self-pay | Admitting: Nurse Practitioner

## 2024-06-18 ENCOUNTER — Other Ambulatory Visit: Payer: Self-pay

## 2024-06-18 DIAGNOSIS — E1165 Type 2 diabetes mellitus with hyperglycemia: Secondary | ICD-10-CM

## 2024-06-19 NOTE — Telephone Encounter (Signed)
 Requested medications are due for refill today.  yes  Requested medications are on the active medications list.  yes  Last refill. 10/15/2023 #180 1 rf  Future visit scheduled.   yes  Notes to clinic.  CBC is expired.    Requested Prescriptions  Pending Prescriptions Disp Refills   metFORMIN  (GLUCOPHAGE ) 500 MG tablet 180 tablet 1    Sig: Take 2 tablets (1,000 mg total) by mouth 2 (two) times daily with a meal. FOR DIABETES     Endocrinology:  Diabetes - Biguanides Failed - 06/19/2024  3:04 PM      Failed - HBA1C is between 0 and 7.9 and within 180 days    Hemoglobin A1C  Date Value Ref Range Status  04/14/2024 8.3 (A) 4.0 - 5.6 % Final   HbA1c, POC (controlled diabetic range)  Date Value Ref Range Status  03/26/2023 6.5 0.0 - 7.0 % Final         Failed - B12 Level in normal range and within 720 days    No results found for: VITAMINB12       Failed - CBC within normal limits and completed in the last 12 months    WBC  Date Value Ref Range Status  03/26/2023 9.0 3.4 - 10.8 x10E3/uL Final  05/17/2016 6.9 3.8 - 10.8 K/uL Final   RBC  Date Value Ref Range Status  03/26/2023 4.24 3.77 - 5.28 x10E6/uL Final  05/17/2016 4.17 3.80 - 5.10 MIL/uL Final   Hemoglobin  Date Value Ref Range Status  03/26/2023 12.8 11.1 - 15.9 g/dL Final   Hematocrit  Date Value Ref Range Status  03/26/2023 39.5 34.0 - 46.6 % Final   MCHC  Date Value Ref Range Status  03/26/2023 32.4 31.5 - 35.7 g/dL Final  91/90/7982 66.7 32.0 - 36.0 g/dL Final   [facility]  Date Value Ref Range Status  03/26/2023 30.2 26.6 - 33.0 pg Final  05/17/2016 30.7 27.0 - 33.0 pg Final   MCV  Date Value Ref Range Status  03/26/2023 93 79 - 97 fL Final   No results found for: PLTCOUNTKUC, LABPLAT, POCPLA RDW  Date Value Ref Range Status  03/26/2023 14.0 11.7 - 15.4 % Final         Passed - Cr in normal range and within 360 days    Creat  Date Value Ref Range Status  04/14/2016 0.60 0.50 - 1.10 mg/dL  Final   Creatinine, Ser  Date Value Ref Range Status  04/14/2024 0.59 0.57 - 1.00 mg/dL Final         Passed - eGFR in normal range and within 360 days    GFR, Est African American  Date Value Ref Range Status  04/14/2016 >89 >=60 mL/min Final   GFR calc Af Amer  Date Value Ref Range Status  10/18/2020 128 >59 mL/min/1.73 Final    Comment:    **In accordance with recommendations from the NKF-ASN Task force,**   [facility] is in the process of updating its eGFR calculation to the   2021 CKD-EPI creatinine equation that estimates kidney function   without a race variable.    GFR, Est Non African American  Date Value Ref Range Status  04/14/2016 >89 >=60 mL/min Final   GFR calc non Af Amer  Date Value Ref Range Status  10/18/2020 111 >59 mL/min/1.73 Final   eGFR  Date Value Ref Range Status  04/14/2024 111 >59 mL/min/1.73 Final         Passed - Valid encounter  within last 6 months    Recent Outpatient Visits           2 months ago Diabetes mellitus treated with oral medication (HCC)   [facility] [place]. [facility] [place], [place] W, NP   5 months ago Type 2 diabetes mellitus with hyperglycemia, without long-term current use of insulin (HCC)   [facility] [place]. [facility] Theotis Haze ORN, NP   8 months ago Diabetes mellitus treated with oral medication [facility])   [facility] [place]. [facility] [place], [place] W, NP   11 months ago Type 2 diabetes mellitus with hyperglycemia, without long-term current use of insulin (HCC)   [facility] Of [place]. [facility] [place], [place] W, NP   1 year ago Type 2 diabetes mellitus with hyperglycemia, without long-term current use of insulin (HCC)   [facility] Of [place]. [facility] Theotis Haze ORN, NP       Future  Appointments             In 1 month Theotis Haze ORN, NP [facility] - [facility] Dept Of [place]. [facility], [place]

## 2024-06-20 ENCOUNTER — Other Ambulatory Visit: Payer: Self-pay

## 2024-06-20 MED ORDER — METFORMIN HCL 500 MG PO TABS
1000.0000 mg | ORAL_TABLET | Freq: Two times a day (BID) | ORAL | 1 refills | Status: AC
  Filled 2024-06-20: qty 180, 45d supply, fill #0
  Filled 2024-09-08: qty 180, 45d supply, fill #1

## 2024-06-23 ENCOUNTER — Other Ambulatory Visit: Payer: Self-pay

## 2024-06-30 ENCOUNTER — Ambulatory Visit: Payer: Self-pay | Attending: Nurse Practitioner | Admitting: Nurse Practitioner

## 2024-06-30 ENCOUNTER — Encounter: Payer: Self-pay | Admitting: Nurse Practitioner

## 2024-06-30 VITALS — BP 123/73 | HR 68 | Resp 19 | Ht 64.0 in | Wt 177.6 lb

## 2024-06-30 DIAGNOSIS — Z23 Encounter for immunization: Secondary | ICD-10-CM

## 2024-06-30 DIAGNOSIS — Z79899 Other long term (current) drug therapy: Secondary | ICD-10-CM

## 2024-06-30 DIAGNOSIS — R222 Localized swelling, mass and lump, trunk: Secondary | ICD-10-CM

## 2024-06-30 NOTE — Progress Notes (Signed)
 Assessment & Plan:  Marissa Jimenez was seen today for chest swelling.  Diagnoses and all orders for this visit:  Mass of left chest wall -     US  CHEST SOFT TISSUE; Future  Need for influenza vaccination -     Flu vaccine trivalent PF, 6mos and older(Flulaval,Afluria,Fluarix,Fluzone)    Patient has been counseled on age-appropriate routine health concerns for screening and prevention. These are reviewed and up-to-date. Referrals have been placed accordingly. Immunizations are up-to-date or declined.    Subjective:   Chief Complaint  Patient presents with   Chest Swelling    Patient states that she has swelling on the left side of her chest. The swelling has been present for more than a month.     Marissa Jimenez 48 y.o. female presents to office today with concerns of left chest wall mass. Onset 1 month ago. Tenderness if present with deep palpation.  Mammogram last month was normal. She denies any recent injury or trauma  Review of Systems  Constitutional:  Negative for fever, malaise/fatigue and weight loss.  HENT: Negative.  Negative for nosebleeds.   Eyes: Negative.  Negative for blurred vision, double vision and photophobia.  Respiratory: Negative.  Negative for cough and shortness of breath.   Cardiovascular: Negative.  Negative for chest pain, palpitations and leg swelling.  Gastrointestinal: Negative.  Negative for heartburn, nausea and vomiting.  Musculoskeletal:  Negative for myalgias.       SEE HPI  Neurological: Negative.  Negative for dizziness, focal weakness, seizures and headaches.  Psychiatric/Behavioral: Negative.  Negative for suicidal ideas.     Past Medical History:  Diagnosis Date   Arthritis 2012   Chiari malformation type I (HCC)    Diabetes mellitus without complication (HCC)    Headache 2013   Subacromial bursitis 08/04/2013    Past Surgical History:  Procedure Laterality Date   CESAREAN SECTION     two c-sections 2002, 2008   COLONOSCOPY  WITH PROPOFOL  N/A 05/28/2023   Procedure: COLONOSCOPY WITH PROPOFOL ;  Surgeon: Shila Gustav GAILS, MD;  Location: [facility];  Service: Gastroenterology;  Laterality: N/A;   TUBAL LIGATION      Family History  Problem Relation Age of Onset   Hyperlipidemia Mother    Arthritis Mother    Diabetes Father    Diabetes Sister    Alcohol abuse Brother    Cancer Neg Hx    Stomach cancer Neg Hx     Social History Reviewed with no changes to be made today.   Outpatient Medications Prior to Visit  Medication Sig Dispense Refill   empagliflozin  (JARDIANCE ) 10 MG TABS tablet Take 1 tablet (10 mg total) by mouth daily before breakfast. NEEDS PASS 30 tablet 6   lisinopril  (ZESTRIL ) 2.5 MG tablet Take 1 tablet (2.5 mg total) by mouth daily. To help protect kidneys 90 tablet 1   metFORMIN  (GLUCOPHAGE ) 500 MG tablet Take 2 tablets (1,000 mg total) by mouth 2 (two) times daily with a meal. FOR DIABETES 180 tablet 1   No facility-administered medications prior to visit.    No Known Allergies     Objective:    BP 123/73 (BP Location: Left Arm, Patient Position: Sitting, Cuff Size: Normal)   Pulse 68   Resp 19   Ht 5' 4 (1.626 m)   Wt 177 lb 9.6 oz (80.6 kg)   SpO2 98%   BMI 30.48 kg/m  Wt Readings from Last 3 Encounters:  06/30/24 177 lb 9.6 oz (80.6 kg)  04/14/24 180 lb (81.6 kg)  01/14/24 181 lb 9.6 oz (82.4 kg)    Physical Exam Vitals and nursing note reviewed.  Constitutional:      Appearance: She is well-developed.  HENT:     Head: Normocephalic and atraumatic.  Cardiovascular:     Rate and Rhythm: Normal rate and regular rhythm.     Heart sounds: Normal heart sounds. No murmur heard.    No friction rub. No gallop.  Pulmonary:     Effort: Pulmonary effort is normal. No tachypnea or respiratory distress.     Breath sounds: Normal breath sounds. No decreased breath sounds, wheezing, rhonchi or rales.  Chest:     Chest wall: No tenderness.  Abdominal:     General: Bowel  sounds are normal.     Palpations: Abdomen is soft.  Musculoskeletal:        General: Normal range of motion.     Cervical back: Normal range of motion.  Skin:    General: Skin is warm and dry.  Neurological:     Mental Status: She is alert and oriented to person, place, and time.     Coordination: Coordination normal.  Psychiatric:        Behavior: Behavior normal. Behavior is cooperative.        Thought Content: Thought content normal.        Judgment: Judgment normal.          Patient has been counseled extensively about nutrition and exercise as well as the importance of adherence with medications and regular follow-up. The patient was given clear instructions to go to ER or return to medical center if symptoms don't improve, worsen or new problems develop. The patient verbalized understanding.   Follow-up: Return if symptoms worsen or fail to improve.   Haze LELON Servant, FNP-BC [facility] [place], [place] [phone]   06/30/2024, 3:32 PM

## 2024-07-03 ENCOUNTER — Ambulatory Visit (HOSPITAL_COMMUNITY)
Admission: RE | Admit: 2024-07-03 | Discharge: 2024-07-03 | Disposition: A | Discharge status: Home / Self Care | Payer: Self-pay | Source: Ambulatory Visit | Attending: Nurse Practitioner | Admitting: Nurse Practitioner

## 2024-07-03 DIAGNOSIS — R222 Localized swelling, mass and lump, trunk: Secondary | ICD-10-CM | POA: Insufficient documentation

## 2024-07-09 ENCOUNTER — Ambulatory Visit: Payer: Self-pay | Admitting: Nurse Practitioner

## 2024-07-10 NOTE — Telephone Encounter (Signed)
 Copied from CRM [id number]. Topic: Clinical - Lab/Test Results >> Jul 10, 2024  1:15 PM Delon DASEN wrote: Reason for CRM: returning call from office- [phone]

## 2024-07-11 ENCOUNTER — Telehealth: Payer: Self-pay

## 2024-07-11 NOTE — Telephone Encounter (Signed)
 Patient in office today and given US  result per pcp No suspicious masses in chest seen on ultrasound.   Patient given appointment reminder for 07/21/2024

## 2024-07-21 ENCOUNTER — Ambulatory Visit: Payer: Self-pay | Admitting: Nurse Practitioner

## 2024-08-12 ENCOUNTER — Other Ambulatory Visit: Payer: Self-pay

## 2024-08-13 ENCOUNTER — Other Ambulatory Visit: Payer: Self-pay

## 2024-08-25 ENCOUNTER — Ambulatory Visit: Admitting: Nurse Practitioner

## 2024-09-08 ENCOUNTER — Other Ambulatory Visit: Payer: Self-pay

## 2024-09-09 ENCOUNTER — Other Ambulatory Visit: Payer: Self-pay

## 2024-09-16 ENCOUNTER — Other Ambulatory Visit: Payer: Self-pay

## 2024-10-13 ENCOUNTER — Ambulatory Visit: Admitting: Nurse Practitioner

## 2024-11-10 ENCOUNTER — Ambulatory Visit: Admitting: Nurse Practitioner

## 2024-11-10 ENCOUNTER — Other Ambulatory Visit: Payer: Self-pay

## 2024-12-01 ENCOUNTER — Ambulatory Visit: Payer: Self-pay | Admitting: Nurse Practitioner

## 2024-12-01 ENCOUNTER — Ambulatory Visit: Admitting: Nurse Practitioner
